# Patient Record
Sex: Female | Born: 1935
Health system: Southern US, Community
[De-identification: ages and names within clinical notes are randomized; demographics above are authoritative.]

## PROBLEM LIST (undated history)

## (undated) DIAGNOSIS — M1990 Unspecified osteoarthritis, unspecified site: Secondary | ICD-10-CM

## (undated) DIAGNOSIS — Z800 Family history of malignant neoplasm of digestive organs: Secondary | ICD-10-CM

## (undated) DIAGNOSIS — I10 Essential (primary) hypertension: Secondary | ICD-10-CM

## (undated) DIAGNOSIS — Z8041 Family history of malignant neoplasm of ovary: Secondary | ICD-10-CM

## (undated) DIAGNOSIS — Z803 Family history of malignant neoplasm of breast: Secondary | ICD-10-CM

## (undated) DIAGNOSIS — T8859XA Other complications of anesthesia, initial encounter: Secondary | ICD-10-CM

## (undated) DIAGNOSIS — T4145XA Adverse effect of unspecified anesthetic, initial encounter: Secondary | ICD-10-CM

## (undated) HISTORY — PX: EYE SURGERY: SHX253

## (undated) HISTORY — PX: ABDOMINAL HYSTERECTOMY: SHX81

## (undated) HISTORY — DX: Family history of malignant neoplasm of breast: Z80.3

## (undated) HISTORY — PX: TUBAL LIGATION: SHX77

## (undated) HISTORY — DX: Family history of malignant neoplasm of digestive organs: Z80.0

## (undated) HISTORY — DX: Family history of malignant neoplasm of ovary: Z80.41

## (undated) HISTORY — PX: CHOLECYSTECTOMY: SHX55

---
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
## 1998-09-03 ENCOUNTER — Ambulatory Visit (HOSPITAL_COMMUNITY): Admission: RE | Admit: 1998-09-03 | Discharge: 1998-09-03 | Payer: Self-pay | Admitting: Obstetrics & Gynecology

## 1998-09-15 ENCOUNTER — Encounter: Payer: Self-pay | Admitting: Emergency Medicine

## 1998-09-15 ENCOUNTER — Emergency Department (HOSPITAL_COMMUNITY): Admission: EM | Admit: 1998-09-15 | Discharge: 1998-09-15 | Payer: Self-pay | Admitting: Emergency Medicine

## 1999-03-24 ENCOUNTER — Other Ambulatory Visit: Admission: RE | Admit: 1999-03-24 | Discharge: 1999-03-24 | Payer: Self-pay | Admitting: Obstetrics & Gynecology

## 1999-09-21 ENCOUNTER — Encounter: Payer: Self-pay | Admitting: Cardiology

## 1999-09-21 ENCOUNTER — Ambulatory Visit (HOSPITAL_COMMUNITY): Admission: RE | Admit: 1999-09-21 | Discharge: 1999-09-21 | Payer: Self-pay | Admitting: Cardiology

## 2000-09-28 ENCOUNTER — Ambulatory Visit (HOSPITAL_COMMUNITY): Admission: RE | Admit: 2000-09-28 | Discharge: 2000-09-28 | Payer: Self-pay | Admitting: Internal Medicine

## 2000-09-28 ENCOUNTER — Encounter: Payer: Self-pay | Admitting: Internal Medicine

## 2001-04-05 ENCOUNTER — Other Ambulatory Visit: Admission: RE | Admit: 2001-04-05 | Discharge: 2001-04-05 | Payer: Self-pay | Admitting: Internal Medicine

## 2001-09-29 ENCOUNTER — Ambulatory Visit (HOSPITAL_COMMUNITY): Admission: RE | Admit: 2001-09-29 | Discharge: 2001-09-29 | Payer: Self-pay | Admitting: Internal Medicine

## 2001-09-29 ENCOUNTER — Encounter: Payer: Self-pay | Admitting: Internal Medicine

## 2002-10-01 ENCOUNTER — Encounter: Payer: Self-pay | Admitting: Internal Medicine

## 2002-10-01 ENCOUNTER — Ambulatory Visit (HOSPITAL_COMMUNITY): Admission: RE | Admit: 2002-10-01 | Discharge: 2002-10-01 | Payer: Self-pay | Admitting: Internal Medicine

## 2003-11-01 ENCOUNTER — Ambulatory Visit (HOSPITAL_COMMUNITY): Admission: RE | Admit: 2003-11-01 | Discharge: 2003-11-01 | Payer: Self-pay | Admitting: Internal Medicine

## 2004-11-27 ENCOUNTER — Ambulatory Visit (HOSPITAL_COMMUNITY): Admission: RE | Admit: 2004-11-27 | Discharge: 2004-11-27 | Payer: Self-pay | Admitting: Internal Medicine

## 2005-12-16 ENCOUNTER — Ambulatory Visit (HOSPITAL_COMMUNITY): Admission: RE | Admit: 2005-12-16 | Discharge: 2005-12-16 | Payer: Self-pay | Admitting: Internal Medicine

## 2006-12-19 ENCOUNTER — Ambulatory Visit (HOSPITAL_COMMUNITY): Admission: RE | Admit: 2006-12-19 | Discharge: 2006-12-19 | Payer: Self-pay | Admitting: Internal Medicine

## 2007-07-27 ENCOUNTER — Encounter: Admission: RE | Admit: 2007-07-27 | Discharge: 2007-07-27 | Payer: Self-pay | Admitting: Otolaryngology

## 2007-12-22 ENCOUNTER — Ambulatory Visit (HOSPITAL_COMMUNITY): Admission: RE | Admit: 2007-12-22 | Discharge: 2007-12-22 | Payer: Self-pay | Admitting: Internal Medicine

## 2008-02-14 ENCOUNTER — Encounter: Payer: Self-pay | Admitting: Pulmonary Disease

## 2008-02-14 ENCOUNTER — Encounter: Admission: RE | Admit: 2008-02-14 | Discharge: 2008-02-14 | Payer: Self-pay | Admitting: Internal Medicine

## 2008-02-22 ENCOUNTER — Emergency Department (HOSPITAL_COMMUNITY): Admission: EM | Admit: 2008-02-22 | Discharge: 2008-02-22 | Payer: Self-pay | Admitting: Emergency Medicine

## 2008-06-28 ENCOUNTER — Encounter: Admission: RE | Admit: 2008-06-28 | Discharge: 2008-06-28 | Payer: Self-pay | Admitting: Internal Medicine

## 2008-10-21 ENCOUNTER — Encounter: Payer: Self-pay | Admitting: Pulmonary Disease

## 2008-10-21 ENCOUNTER — Encounter: Admission: RE | Admit: 2008-10-21 | Discharge: 2008-10-21 | Payer: Self-pay | Admitting: Internal Medicine

## 2008-12-25 ENCOUNTER — Encounter: Admission: RE | Admit: 2008-12-25 | Discharge: 2008-12-25 | Payer: Self-pay | Admitting: Internal Medicine

## 2008-12-31 ENCOUNTER — Encounter: Admission: RE | Admit: 2008-12-31 | Discharge: 2008-12-31 | Payer: Self-pay | Admitting: Internal Medicine

## 2009-02-13 ENCOUNTER — Ambulatory Visit: Payer: Self-pay | Admitting: Pulmonary Disease

## 2009-02-13 DIAGNOSIS — J984 Other disorders of lung: Secondary | ICD-10-CM | POA: Insufficient documentation

## 2009-02-13 DIAGNOSIS — E782 Mixed hyperlipidemia: Secondary | ICD-10-CM | POA: Insufficient documentation

## 2009-02-13 DIAGNOSIS — J90 Pleural effusion, not elsewhere classified: Secondary | ICD-10-CM | POA: Insufficient documentation

## 2009-02-13 DIAGNOSIS — E785 Hyperlipidemia, unspecified: Secondary | ICD-10-CM

## 2009-02-13 DIAGNOSIS — J309 Allergic rhinitis, unspecified: Secondary | ICD-10-CM | POA: Insufficient documentation

## 2009-03-31 ENCOUNTER — Ambulatory Visit: Payer: Self-pay | Admitting: Cardiovascular Disease

## 2009-06-23 ENCOUNTER — Encounter: Admission: RE | Admit: 2009-06-23 | Discharge: 2009-06-23 | Payer: Self-pay | Admitting: Internal Medicine

## 2009-12-30 ENCOUNTER — Encounter: Admission: RE | Admit: 2009-12-30 | Discharge: 2009-12-30 | Payer: Self-pay | Admitting: Internal Medicine

## 2010-01-08 ENCOUNTER — Encounter: Admission: RE | Admit: 2010-01-08 | Discharge: 2010-01-08 | Payer: Self-pay | Admitting: Internal Medicine

## 2010-01-13 ENCOUNTER — Encounter: Admission: RE | Admit: 2010-01-13 | Discharge: 2010-01-13 | Payer: Self-pay | Admitting: Internal Medicine

## 2010-12-13 ENCOUNTER — Encounter: Payer: Self-pay | Admitting: Internal Medicine

## 2010-12-14 ENCOUNTER — Other Ambulatory Visit: Payer: Self-pay | Admitting: Internal Medicine

## 2010-12-14 DIAGNOSIS — Z1239 Encounter for other screening for malignant neoplasm of breast: Secondary | ICD-10-CM

## 2010-12-31 ENCOUNTER — Ambulatory Visit
Admission: RE | Admit: 2010-12-31 | Discharge: 2010-12-31 | Disposition: A | Discharge status: Home / Self Care | Payer: Medicare Other | Source: Ambulatory Visit | Attending: Internal Medicine | Admitting: Internal Medicine

## 2010-12-31 DIAGNOSIS — Z1239 Encounter for other screening for malignant neoplasm of breast: Secondary | ICD-10-CM

## 2011-04-22 ENCOUNTER — Ambulatory Visit: Payer: Self-pay | Admitting: Ophthalmology

## 2011-05-05 ENCOUNTER — Ambulatory Visit: Payer: Self-pay | Admitting: Ophthalmology

## 2011-10-21 ENCOUNTER — Other Ambulatory Visit: Payer: Self-pay | Admitting: Gastroenterology

## 2011-11-30 ENCOUNTER — Other Ambulatory Visit: Payer: Self-pay | Admitting: Internal Medicine

## 2011-11-30 DIAGNOSIS — Z1231 Encounter for screening mammogram for malignant neoplasm of breast: Secondary | ICD-10-CM

## 2012-01-04 ENCOUNTER — Ambulatory Visit
Admission: RE | Admit: 2012-01-04 | Discharge: 2012-01-04 | Disposition: A | Discharge status: Home / Self Care | Payer: Medicare Other | Source: Ambulatory Visit | Attending: Internal Medicine | Admitting: Internal Medicine

## 2012-01-04 ENCOUNTER — Ambulatory Visit: Payer: Medicare Other

## 2012-01-04 DIAGNOSIS — Z1231 Encounter for screening mammogram for malignant neoplasm of breast: Secondary | ICD-10-CM | POA: Diagnosis not present

## 2012-01-10 DIAGNOSIS — K589 Irritable bowel syndrome without diarrhea: Secondary | ICD-10-CM | POA: Diagnosis not present

## 2012-01-10 DIAGNOSIS — Z86010 Personal history of colonic polyps: Secondary | ICD-10-CM | POA: Diagnosis not present

## 2012-05-23 ENCOUNTER — Other Ambulatory Visit: Payer: Self-pay | Admitting: Dermatology

## 2012-05-23 DIAGNOSIS — Z85828 Personal history of other malignant neoplasm of skin: Secondary | ICD-10-CM | POA: Diagnosis not present

## 2012-05-23 DIAGNOSIS — C44319 Basal cell carcinoma of skin of other parts of face: Secondary | ICD-10-CM | POA: Diagnosis not present

## 2012-05-29 DIAGNOSIS — H4010X0 Unspecified open-angle glaucoma, stage unspecified: Secondary | ICD-10-CM | POA: Diagnosis not present

## 2012-08-01 DIAGNOSIS — Z85828 Personal history of other malignant neoplasm of skin: Secondary | ICD-10-CM | POA: Diagnosis not present

## 2012-08-22 DIAGNOSIS — Z23 Encounter for immunization: Secondary | ICD-10-CM | POA: Diagnosis not present

## 2012-09-25 DIAGNOSIS — IMO0002 Reserved for concepts with insufficient information to code with codable children: Secondary | ICD-10-CM | POA: Diagnosis not present

## 2012-09-25 DIAGNOSIS — M1710 Unilateral primary osteoarthritis, unspecified knee: Secondary | ICD-10-CM | POA: Diagnosis not present

## 2012-12-04 DIAGNOSIS — H4010X0 Unspecified open-angle glaucoma, stage unspecified: Secondary | ICD-10-CM | POA: Diagnosis not present

## 2012-12-11 DIAGNOSIS — H26499 Other secondary cataract, unspecified eye: Secondary | ICD-10-CM | POA: Diagnosis not present

## 2012-12-13 ENCOUNTER — Other Ambulatory Visit: Payer: Self-pay | Admitting: Internal Medicine

## 2012-12-13 DIAGNOSIS — Z1231 Encounter for screening mammogram for malignant neoplasm of breast: Secondary | ICD-10-CM

## 2012-12-14 DIAGNOSIS — E785 Hyperlipidemia, unspecified: Secondary | ICD-10-CM | POA: Diagnosis not present

## 2012-12-14 DIAGNOSIS — M899 Disorder of bone, unspecified: Secondary | ICD-10-CM | POA: Diagnosis not present

## 2012-12-14 DIAGNOSIS — M949 Disorder of cartilage, unspecified: Secondary | ICD-10-CM | POA: Diagnosis not present

## 2012-12-14 DIAGNOSIS — N318 Other neuromuscular dysfunction of bladder: Secondary | ICD-10-CM | POA: Diagnosis not present

## 2012-12-21 DIAGNOSIS — Z0000 Encounter for general adult medical examination without abnormal findings: Secondary | ICD-10-CM | POA: Diagnosis not present

## 2012-12-21 DIAGNOSIS — N318 Other neuromuscular dysfunction of bladder: Secondary | ICD-10-CM | POA: Diagnosis not present

## 2012-12-21 DIAGNOSIS — Z124 Encounter for screening for malignant neoplasm of cervix: Secondary | ICD-10-CM | POA: Diagnosis not present

## 2012-12-21 DIAGNOSIS — D869 Sarcoidosis, unspecified: Secondary | ICD-10-CM | POA: Diagnosis not present

## 2012-12-21 DIAGNOSIS — R3129 Other microscopic hematuria: Secondary | ICD-10-CM | POA: Diagnosis not present

## 2012-12-21 DIAGNOSIS — F329 Major depressive disorder, single episode, unspecified: Secondary | ICD-10-CM | POA: Diagnosis not present

## 2012-12-21 DIAGNOSIS — R030 Elevated blood-pressure reading, without diagnosis of hypertension: Secondary | ICD-10-CM | POA: Diagnosis not present

## 2013-01-01 ENCOUNTER — Other Ambulatory Visit: Payer: Self-pay | Admitting: Dermatology

## 2013-01-01 DIAGNOSIS — L820 Inflamed seborrheic keratosis: Secondary | ICD-10-CM | POA: Diagnosis not present

## 2013-01-01 DIAGNOSIS — L821 Other seborrheic keratosis: Secondary | ICD-10-CM | POA: Diagnosis not present

## 2013-01-01 DIAGNOSIS — Z85828 Personal history of other malignant neoplasm of skin: Secondary | ICD-10-CM | POA: Diagnosis not present

## 2013-01-01 DIAGNOSIS — D485 Neoplasm of uncertain behavior of skin: Secondary | ICD-10-CM | POA: Diagnosis not present

## 2013-01-10 DIAGNOSIS — G4733 Obstructive sleep apnea (adult) (pediatric): Secondary | ICD-10-CM | POA: Diagnosis not present

## 2013-01-16 ENCOUNTER — Ambulatory Visit
Admission: RE | Admit: 2013-01-16 | Discharge: 2013-01-16 | Disposition: A | Discharge status: Home / Self Care | Payer: Medicare Other | Source: Ambulatory Visit | Attending: Internal Medicine | Admitting: Internal Medicine

## 2013-01-16 DIAGNOSIS — Z1231 Encounter for screening mammogram for malignant neoplasm of breast: Secondary | ICD-10-CM

## 2013-03-26 DIAGNOSIS — H4010X0 Unspecified open-angle glaucoma, stage unspecified: Secondary | ICD-10-CM | POA: Diagnosis not present

## 2013-04-13 DIAGNOSIS — H4010X0 Unspecified open-angle glaucoma, stage unspecified: Secondary | ICD-10-CM | POA: Diagnosis not present

## 2013-05-24 DIAGNOSIS — H4010X0 Unspecified open-angle glaucoma, stage unspecified: Secondary | ICD-10-CM | POA: Diagnosis not present

## 2013-07-26 DIAGNOSIS — H4010X0 Unspecified open-angle glaucoma, stage unspecified: Secondary | ICD-10-CM | POA: Diagnosis not present

## 2013-07-27 DIAGNOSIS — M1710 Unilateral primary osteoarthritis, unspecified knee: Secondary | ICD-10-CM | POA: Diagnosis not present

## 2013-09-03 DIAGNOSIS — H4010X0 Unspecified open-angle glaucoma, stage unspecified: Secondary | ICD-10-CM | POA: Diagnosis not present

## 2013-09-06 DIAGNOSIS — Z23 Encounter for immunization: Secondary | ICD-10-CM | POA: Diagnosis not present

## 2013-10-01 DIAGNOSIS — H4010X0 Unspecified open-angle glaucoma, stage unspecified: Secondary | ICD-10-CM | POA: Diagnosis not present

## 2013-10-23 DIAGNOSIS — M899 Disorder of bone, unspecified: Secondary | ICD-10-CM | POA: Diagnosis not present

## 2013-11-12 DIAGNOSIS — M1710 Unilateral primary osteoarthritis, unspecified knee: Secondary | ICD-10-CM | POA: Diagnosis not present

## 2013-12-03 DIAGNOSIS — IMO0002 Reserved for concepts with insufficient information to code with codable children: Secondary | ICD-10-CM | POA: Diagnosis not present

## 2013-12-03 DIAGNOSIS — M1710 Unilateral primary osteoarthritis, unspecified knee: Secondary | ICD-10-CM | POA: Diagnosis not present

## 2013-12-31 DIAGNOSIS — Z79899 Other long term (current) drug therapy: Secondary | ICD-10-CM | POA: Diagnosis not present

## 2013-12-31 DIAGNOSIS — M949 Disorder of cartilage, unspecified: Secondary | ICD-10-CM | POA: Diagnosis not present

## 2013-12-31 DIAGNOSIS — M899 Disorder of bone, unspecified: Secondary | ICD-10-CM | POA: Diagnosis not present

## 2013-12-31 DIAGNOSIS — E785 Hyperlipidemia, unspecified: Secondary | ICD-10-CM | POA: Diagnosis not present

## 2014-01-04 DIAGNOSIS — H4010X0 Unspecified open-angle glaucoma, stage unspecified: Secondary | ICD-10-CM | POA: Diagnosis not present

## 2014-01-07 DIAGNOSIS — H409 Unspecified glaucoma: Secondary | ICD-10-CM | POA: Diagnosis not present

## 2014-01-07 DIAGNOSIS — R030 Elevated blood-pressure reading, without diagnosis of hypertension: Secondary | ICD-10-CM | POA: Diagnosis not present

## 2014-01-07 DIAGNOSIS — Z23 Encounter for immunization: Secondary | ICD-10-CM | POA: Diagnosis not present

## 2014-01-07 DIAGNOSIS — M1710 Unilateral primary osteoarthritis, unspecified knee: Secondary | ICD-10-CM | POA: Diagnosis not present

## 2014-01-07 DIAGNOSIS — Z1331 Encounter for screening for depression: Secondary | ICD-10-CM | POA: Diagnosis not present

## 2014-01-07 DIAGNOSIS — D869 Sarcoidosis, unspecified: Secondary | ICD-10-CM | POA: Diagnosis not present

## 2014-01-07 DIAGNOSIS — Z79899 Other long term (current) drug therapy: Secondary | ICD-10-CM | POA: Diagnosis not present

## 2014-01-07 DIAGNOSIS — K589 Irritable bowel syndrome without diarrhea: Secondary | ICD-10-CM | POA: Diagnosis not present

## 2014-01-07 DIAGNOSIS — IMO0002 Reserved for concepts with insufficient information to code with codable children: Secondary | ICD-10-CM | POA: Diagnosis not present

## 2014-01-07 DIAGNOSIS — E669 Obesity, unspecified: Secondary | ICD-10-CM | POA: Diagnosis not present

## 2014-01-07 DIAGNOSIS — Z0000 Encounter for general adult medical examination without abnormal findings: Secondary | ICD-10-CM | POA: Diagnosis not present

## 2014-01-07 DIAGNOSIS — E785 Hyperlipidemia, unspecified: Secondary | ICD-10-CM | POA: Diagnosis not present

## 2014-01-07 DIAGNOSIS — Z124 Encounter for screening for malignant neoplasm of cervix: Secondary | ICD-10-CM | POA: Diagnosis not present

## 2014-01-08 DIAGNOSIS — Z1212 Encounter for screening for malignant neoplasm of rectum: Secondary | ICD-10-CM | POA: Diagnosis not present

## 2014-02-01 DIAGNOSIS — H4010X0 Unspecified open-angle glaucoma, stage unspecified: Secondary | ICD-10-CM | POA: Diagnosis not present

## 2014-02-15 ENCOUNTER — Encounter (HOSPITAL_COMMUNITY): Payer: Self-pay | Admitting: Pharmacy Technician

## 2014-02-20 NOTE — Patient Instructions (Addendum)
20     Your procedure is scheduled on:  Wednesday 02/27/2014  Report to [place] at  1130 AM.  Call this number if you have problems the night before or morning of surgery: [phone]   Remember:             IF YOU USE CPAP,BRING MASK AND TUBING AM OF SURGERY!             IF YOU DO NOT HAVE YOUR TYPE AND SCREEN DRAWN AT PRE-ADMIT APPOINTMENT, YOU WILL HAVE IT DRAWN AM OF SURGERY!   Do not eat food  AFTER MIDNIGHT! MAY HAVE CLEAR LIQUIDS FROM MIDNIGHT UP UNTIL 0800 AM THEN NOTHING UNTIL AFTER SURGERY!  Take these medicines the morning of surgery with A SIP OF WATER:  Use eye drops if needed    [place].  Marland Kitchen  Leave suitcase in the car. After surgery it may be brought to your room.  For patients admitted to the hospital, checkout time is 11:00 AM the day of              Discharge.    DO NOT WEAR JEWELRY,MAKE-UP,LOTIONS,POWDERS,PERFUMES,CONTACTS , DENTURES OR BRIDGEWORK ,AND DO NOT WEAR FALSE EYELASHES                                    Patients discharged the day of surgery will not be allowed to drive home.  If going home the same day of surgery, must have someone stay with you  first 24 hrs.at home and arrange for someone to drive you home from the [place]: N/A   Special Instructions:              Please read over the following fact sheets that you were given:             1. [place].Tobin Chad     [phone]                              [facility] - Preparing for Surgery Before surgery, you can play an important role.  Because skin is not sterile, your skin needs to be as free of germs as possible.  You can reduce the number of germs on your skin by washing with CHG (chlorahexidine gluconate) soap before surgery.  CHG is an  antiseptic cleaner which kills germs and bonds with the skin to continue killing germs even after washing. Please DO NOT use if you have an allergy to CHG or antibacterial soaps.  If your skin becomes reddened/irritated stop using the CHG and inform your nurse when you arrive at Short Stay. Do not shave (including legs and underarms) for at least 48 hours prior to the first CHG shower.  You may shave your face. Please follow these  instructions carefully:  1.  Shower with CHG Soap the night before surgery and the  morning of Surgery.  2.  If you choose to wash your hair, wash your hair first as usual with your  normal  shampoo.  3.  After you shampoo, rinse your hair and body thoroughly to remove the  shampoo.                           4.  Use CHG as you would any other liquid soap.  You can apply chg directly  to the skin and wash                       Gently with a scrungie or clean washcloth.  5.  Apply the CHG Soap to your body ONLY FROM THE NECK DOWN.   Do not use on open                           Wound or open sores. Avoid contact with eyes, ears mouth and genitals (private parts).                        Genitals (private parts) with your normal soap.             6.  Wash thoroughly, paying special attention to the area where your surgery  will be performed.  7.  Thoroughly rinse your body with warm water from the neck down.  8.  DO NOT shower/wash with your normal soap after using and rinsing off  the CHG Soap.                9.  Pat yourself dry with a clean towel.            10.  Wear clean pajamas.            11.  Place clean sheets on your bed the night of your first shower and do not  sleep with pets. Day of Surgery : Do not apply any lotions/deodorants the morning of surgery.  Please wear clean clothes to the hospital/surgery center.  FAILURE TO FOLLOW THESE INSTRUCTIONS MAY RESULT IN THE CANCELLATION OF YOUR SURGERY PATIENT SIGNATURE_________________________________  NURSE  SIGNATURE__________________________________  Incentive Spirometer  An incentive spirometer is a tool that can help keep your lungs clear and active. This tool measures how well you are filling your lungs with each breath. Taking long deep breaths may help reverse or decrease the chance of developing breathing (pulmonary) problems (especially infection) following:  A long period of time when you are unable to move or be active. BEFORE THE PROCEDURE   If the spirometer includes an indicator to show your best effort, your nurse or respiratory therapist will set it to a desired goal.  If possible, sit up straight or lean slightly forward. Try not to slouch.  Hold the incentive spirometer in an upright position. INSTRUCTIONS FOR USE  1. Sit on the edge of your bed if possible, or sit up as far as you can in bed or on a chair. 2. Hold the incentive spirometer in an upright position. 3. Breathe out normally. 4. Place the mouthpiece in your mouth and seal your lips tightly around it. 5. Breathe in slowly and as deeply as possible, raising the piston or the ball toward the top of the column. 6. Hold your  breath for 3-5 seconds or for as long as possible. Allow the piston or ball to fall to the bottom of the column. 7. Remove the mouthpiece from your mouth and breathe out normally. 8. Rest for a few seconds and repeat Steps 1 through 7 at least 10 times every 1-2 hours when you are awake. Take your time and take a few normal breaths between deep breaths. 9. The spirometer may include an indicator to show your best effort. Use the indicator as a goal to work toward during each repetition. 10. After each set of 10 deep breaths, practice coughing to be sure your lungs are clear. If you have an incision (the cut made at the time of surgery), support your incision when coughing by placing a pillow or rolled up towels firmly against it. Once you are able to get out of bed, walk around indoors and cough  well. You may stop using the incentive spirometer when instructed by your caregiver.  RISKS AND COMPLICATIONS  Take your time so you do not get dizzy or light-headed.  If you are in pain, you may need to take or ask for pain medication before doing incentive spirometry. It is harder to take a deep breath if you are having pain. AFTER USE  Rest and breathe slowly and easily.  It can be helpful to keep track of a log of your progress. Your caregiver can provide you with a simple table to help with this. If you are using the spirometer at home, follow these instructions: [place] IF:   You are having difficultly using the spirometer.  You have trouble using the spirometer as often as instructed.  Your pain medication is not giving enough relief while using the spirometer.  You develop fever of 100.5 F (38.1 C) or higher. SEEK IMMEDIATE MEDICAL CARE IF:   You cough up bloody sputum that had not been present before.  You develop fever of 102 F (38.9 C) or greater.  You develop worsening pain at or near the incision site. MAKE SURE YOU:   Understand these instructions.  Will watch your condition.  Will get help right away if you are not doing well or get worse. Document Released: 03/21/2007 Document Revised: 01/31/2012 Document Reviewed: 05/22/2007 [facility] Patient Information 2014 [place], [place].   WHAT IS A BLOOD TRANSFUSION? Blood Transfusion Information  A transfusion is the replacement of blood or some of its parts. Blood is made up of multiple cells which provide different functions.  Red blood cells carry oxygen and are used for blood loss replacement.  White blood cells fight against infection.  Platelets control bleeding.  Plasma helps clot blood.  Other blood products are available for specialized needs, such as hemophilia or other clotting disorders. BEFORE THE TRANSFUSION  Who gives blood for transfusions?   Healthy volunteers who are fully  evaluated to make sure their blood is safe. This is blood bank blood. Transfusion therapy is the safest it has ever been in the practice of medicine. Before blood is taken from a donor, a complete history is taken to make sure that person has no history of diseases nor engages in risky social behavior (examples are intravenous drug use or sexual activity with multiple partners). The donor's travel history is screened to minimize risk of transmitting infections, such as malaria. The donated blood is tested for signs of infectious diseases, such as HIV and hepatitis. The blood is then tested to be sure it is compatible with you in  order to minimize the chance of a transfusion reaction. If you or a relative donates blood, this is often done in anticipation of surgery and is not appropriate for emergency situations. It takes many days to process the donated blood. RISKS AND COMPLICATIONS Although transfusion therapy is very safe and saves many lives, the main dangers of transfusion include:   Getting an infectious disease.  Developing a transfusion reaction. This is an allergic reaction to something in the blood you were given. Every precaution is taken to prevent this. The decision to have a blood transfusion has been considered carefully by your caregiver before blood is given. Blood is not given unless the benefits outweigh the risks. AFTER THE TRANSFUSION  Right after receiving a blood transfusion, you will usually feel much better and more energetic. This is especially true if your red blood cells have gotten low (anemic). The transfusion raises the level of the red blood cells which carry oxygen, and this usually causes an energy increase.  The nurse administering the transfusion will monitor you carefully for complications. HOME CARE INSTRUCTIONS  No special instructions are needed after a transfusion. You may find your energy is better. Speak with your caregiver about any limitations on activity  for underlying diseases you may have. SEEK MEDICAL CARE IF:   Your condition is not improving after your transfusion.  You develop redness or irritation at the intravenous (IV) site. SEEK IMMEDIATE MEDICAL CARE IF:  Any of the following symptoms occur over the next 12 hours:  Shaking chills.  You have a temperature by mouth above 102 F (38.9 C), not controlled by medicine.  Chest, back, or muscle pain.  People around you feel you are not acting correctly or are confused.  Shortness of breath or difficulty breathing.  Dizziness and fainting.  You get a rash or develop hives.  You have a decrease in urine output.  Your urine turns a dark color or changes to pink, red, or brown. Any of the following symptoms occur over the next 10 days:  You have a temperature by mouth above 102 F (38.9 C), not controlled by medicine.  Shortness of breath.  Weakness after normal activity.  The white part of the eye turns yellow (jaundice).  You have a decrease in the amount of urine or are urinating less often.  Your urine turns a dark color or changes to pink, red, or brown. Document Released: 11/05/2000 Document Revised: 01/31/2012 Document Reviewed: 06/24/2008 [facility] Patient Information 2014 [place].

## 2014-02-21 ENCOUNTER — Encounter (HOSPITAL_COMMUNITY): Payer: Self-pay

## 2014-02-21 ENCOUNTER — Encounter (HOSPITAL_COMMUNITY)
Admission: RE | Admit: 2014-02-21 | Discharge: 2014-02-21 | Disposition: A | Discharge status: Home / Self Care | Payer: Medicare Other | Source: Ambulatory Visit | Attending: Orthopedic Surgery | Admitting: Orthopedic Surgery

## 2014-02-21 DIAGNOSIS — Z01812 Encounter for preprocedural laboratory examination: Secondary | ICD-10-CM | POA: Diagnosis not present

## 2014-02-21 HISTORY — DX: Other complications of anesthesia, initial encounter: T88.59XA

## 2014-02-21 HISTORY — DX: Adverse effect of unspecified anesthetic, initial encounter: T41.45XA

## 2014-02-21 HISTORY — DX: Unspecified osteoarthritis, unspecified site: M19.90

## 2014-02-21 LAB — CBC
HCT: 40.1 % (ref 36.0–46.0)
Hemoglobin: 13.3 g/dL (ref 12.0–15.0)
MCH: 29.3 pg (ref 26.0–34.0)
MCHC: 33.2 g/dL (ref 30.0–36.0)
MCV: 88.3 fL (ref 78.0–100.0)
PLATELETS: 282 10*3/uL (ref 150–400)
RBC: 4.54 MIL/uL (ref 3.87–5.11)
RDW: 13 % (ref 11.5–15.5)
WBC: 8.3 10*3/uL (ref 4.0–10.5)

## 2014-02-21 LAB — SURGICAL PCR SCREEN
MRSA, PCR: NEGATIVE
Staphylococcus aureus: NEGATIVE

## 2014-02-21 LAB — PROTIME-INR
INR: 1.07 (ref 0.00–1.49)
Prothrombin Time: 13.7 seconds (ref 11.6–15.2)

## 2014-02-21 LAB — COMPREHENSIVE METABOLIC PANEL
ALT: 20 U/L (ref 0–35)
AST: 26 U/L (ref 0–37)
Albumin: 3.7 g/dL (ref 3.5–5.2)
Alkaline Phosphatase: 67 U/L (ref 39–117)
BUN: 14 mg/dL (ref 6–23)
CO2: 26 mEq/L (ref 19–32)
Calcium: 9.6 mg/dL (ref 8.4–10.5)
Chloride: 103 mEq/L (ref 96–112)
Creatinine, Ser: 0.79 mg/dL (ref 0.50–1.10)
GFR calc Af Amer: 89 mL/min — ABNORMAL LOW (ref >90)
GFR calc non Af Amer: 77 mL/min — ABNORMAL LOW (ref >90)
Glucose, Bld: 93 mg/dL (ref 70–99)
Potassium: 4.7 mEq/L (ref 3.7–5.3)
Sodium: 141 mEq/L (ref 137–147)
Total Bilirubin: 0.3 mg/dL (ref 0.3–1.2)
Total Protein: 7.5 g/dL (ref 6.0–8.3)

## 2014-02-21 LAB — APTT: aPTT: 32 seconds (ref 24–37)

## 2014-02-21 LAB — URINE MICROSCOPIC-ADD ON

## 2014-02-21 LAB — URINALYSIS, ROUTINE W REFLEX MICROSCOPIC
Bilirubin Urine: NEGATIVE
Glucose, UA: NEGATIVE mg/dL
Ketones, ur: NEGATIVE mg/dL
Leukocytes, UA: NEGATIVE
Nitrite: NEGATIVE
Protein, ur: NEGATIVE mg/dL
Specific Gravity, Urine: 1.015 (ref 1.005–1.030)
Urobilinogen, UA: 0.2 mg/dL (ref 0.0–1.0)
pH: 6 (ref 5.0–8.0)

## 2014-02-21 LAB — ABO/RH: ABO/RH(D): O NEG

## 2014-02-26 NOTE — H&P (Signed)
TOTAL KNEE ADMISSION H&P  Patient is being admitted for right total knee arthroplasty.  Subjective:  Chief Complaint:right knee pain.  HPI: Jaime [name], 80 y.o. female, has a history of pain and functional disability in the right knee due to arthritis and has failed non-surgical conservative treatments for greater than 12 weeks to includeNSAID's and/or analgesics, corticosteriod injections, use of assistive devices and activity modification.  Onset of symptoms was gradual, starting >10 years ago with gradually worsening course since that time. The patient noted prior procedures on the knee to include  arthroscopy and menisectomy on the right knee(s).  Patient currently rates pain in the right knee(s) at 7 out of 10 with activity. Patient has night pain, worsening of pain with activity and weight bearing, pain that interferes with activities of daily living, pain with passive range of motion, crepitus and joint swelling.  Patient has evidence of periarticular osteophytes and joint space narrowing by imaging studies.There is no active infection.  Patient Active Problem List   Diagnosis Date Noted  . HYPERLIPIDEMIA 02/13/2009  . ALLERGIC RHINITIS 02/13/2009  . PLEURAL EFFUSION, RIGHT 02/13/2009  . PULMONARY NODULE 02/13/2009   Past Medical History  Diagnosis Date  . Complication of anesthesia     problem 40 yrs ago Doctor told her anesthesia drugs caused a reaction  . Arthritis     Past Surgical History  Procedure Laterality Date  . Abdominal hysterectomy    . Tubal ligation    . Cholecystectomy    . Eye surgery      cataract with lens implant-right     Current outpatient prescriptions: aspirin 325 MG tablet, Take 325 mg by mouth daily., Disp: , Rfl: ;   bimatoprost (LUMIGAN) 0.01 % SOLN, Place 1 drop into both eyes at bedtime., Disp: , Rfl: ;   Calcium Carb-Cholecalciferol (CALCIUM 600 + D PO), Take 600 tablets by mouth daily., Disp: , Rfl: ;  cholecalciferol (VITAMIN D) 400 UNITS  TABS tablet, Take 400 Units by mouth daily., Disp: , Rfl:  dorzolamide-timolol (COSOPT) 22.3-6.8 MG/ML ophthalmic solution, Place 1 drop into both eyes 2 (two) times daily., Disp: , Rfl: ;   fexofenadine (ALLEGRA) 180 MG tablet, Take 180 mg by mouth daily., Disp: , Rfl: ;   fluticasone (FLONASE) 50 MCG/ACT nasal spray, Place 1 spray into both nostrils daily as needed for allergies or rhinitis., Disp: , Rfl:  hyoscyamine (ANASPAZ) 0.125 MG TBDP disintergrating tablet, Place 0.125 mg under the tongue every 4 (four) hours as needed for cramping., Disp: , Rfl: ;   Multiple Vitamin (MULTIVITAMIN WITH MINERALS) TABS tablet, Take 1 tablet by mouth daily., Disp: , Rfl: ;   naproxen sodium (ALEVE) 220 MG tablet, Take 220 mg by mouth 2 (two) times daily as needed (Pain)., Disp: , Rfl: ;   raloxifene (EVISTA) 60 MG tablet, Take 60 mg by mouth daily., Disp: , Rfl:  rosuvastatin (CRESTOR) 10 MG tablet, Take 5 mg by mouth daily., Disp: , Rfl:   Allergies  Allergen Reactions  . Codeine     "makes me feel weird"  . Meperidine Hcl     Lowers blood pressure   . Sulfonamide Derivatives Rash    History  Substance Use Topics  . Smoking status: Never Smoker   . Smokeless tobacco: Not on file  . Alcohol Use: No      Review of Systems  Constitutional: Negative.   HENT: Negative.   Eyes: Negative.   Respiratory: Negative.   Gastrointestinal: Positive for diarrhea. Negative  for heartburn, nausea, vomiting, abdominal pain, constipation, blood in stool and melena.  Genitourinary: Positive for frequency. Negative for dysuria, urgency, hematuria and flank pain.  Musculoskeletal: Positive for joint pain. Negative for back pain, falls, myalgias and neck pain.  Skin: Negative.   Neurological: Negative.   Endo/Heme/Allergies: Negative.   Psychiatric/Behavioral: Negative.     Objective:  Physical Exam  Constitutional: She is oriented to person, place, and time. She appears well-developed and well-nourished.  No distress.  HENT:  Head: Normocephalic and atraumatic.  Right Ear: External ear normal.  Left Ear: External ear normal.  Nose: Nose normal.  Mouth/Throat: Oropharynx is clear and moist.  Eyes: Conjunctivae and EOM are normal.  Neck: Normal range of motion. Neck supple.  Cardiovascular: Normal rate, regular rhythm, normal heart sounds and intact distal pulses.   Respiratory: Effort normal and breath sounds normal. No respiratory distress. She has no wheezes.  GI: Soft. Bowel sounds are normal. She exhibits no distension. There is no tenderness.  Musculoskeletal:       Right hip: Normal.       Left hip: Normal.       Right knee: She exhibits decreased range of motion, swelling and abnormal alignment. She exhibits no effusion and no erythema. Tenderness found. Medial joint line and lateral joint line tenderness noted.       Left knee: She exhibits decreased range of motion. She exhibits no swelling and no erythema. Tenderness found. Medial joint line tenderness noted.       Right lower leg: She exhibits no tenderness and no swelling.       Left lower leg: She exhibits no tenderness and no swelling.  Pain with motion of the right knee. ROM 5-95 degrees  Neurological: She is alert and oriented to person, place, and time. She has normal strength and normal reflexes. No sensory deficit.  Skin: No rash noted. She is not diaphoretic. No erythema.  Psychiatric: She has a normal mood and affect. Her behavior is normal.    Vitals Weight: 204 lb Height: 65 in Body Surface Area: 2.06 m Body Mass Index: 33.95 kg/m Pulse: 76 (Regular) BP: 145/88 (Sitting, Left Arm, Standard)  Imaging Review Plain radiographs demonstrate severe degenerative joint disease of the right knee(s). The overall alignment issignificant valgus. The bone quality appears to be fair for age and reported activity level.  Assessment/Plan:  End stage arthritis, right knee   The patient history, physical  examination, clinical judgment of the provider and imaging studies are consistent with end stage degenerative joint disease of the right knee(s) and total knee arthroplasty is deemed medically necessary. The treatment options including medical management, injection therapy arthroscopy and arthroplasty were discussed at length. The risks and benefits of total knee arthroplasty were presented and reviewed. The risks due to aseptic loosening, infection, stiffness, patella tracking problems, thromboembolic complications and other imponderables were discussed. The patient acknowledged the explanation, agreed to proceed with the plan and consent was signed. Patient is being admitted for inpatient treatment for surgery, pain control, PT, OT, prophylactic antibiotics, VTE prophylaxis, progressive ambulation and ADL's and discharge planning. The patient is planning to be discharged to skilled nursing facility    [place], [place]

## 2014-02-27 ENCOUNTER — Inpatient Hospital Stay (HOSPITAL_COMMUNITY): Payer: Medicare Other | Admitting: Certified Registered Nurse Anesthetist

## 2014-02-27 ENCOUNTER — Encounter (HOSPITAL_COMMUNITY): Payer: Medicare Other | Admitting: Certified Registered Nurse Anesthetist

## 2014-02-27 ENCOUNTER — Inpatient Hospital Stay (HOSPITAL_COMMUNITY)
Admission: RE | Admit: 2014-02-27 | Discharge: 2014-03-03 | DRG: 470 | Disposition: A | Discharge status: Home Health | Payer: Medicare Other | Source: Ambulatory Visit | Attending: Orthopedic Surgery | Admitting: Orthopedic Surgery

## 2014-02-27 ENCOUNTER — Inpatient Hospital Stay (HOSPITAL_COMMUNITY): Payer: Medicare Other

## 2014-02-27 ENCOUNTER — Encounter (HOSPITAL_COMMUNITY): Payer: Self-pay

## 2014-02-27 ENCOUNTER — Encounter (HOSPITAL_COMMUNITY)
Admission: RE | Disposition: A | Discharge status: Home Health | Payer: Self-pay | Source: Ambulatory Visit | Attending: Orthopedic Surgery

## 2014-02-27 DIAGNOSIS — M24569 Contracture, unspecified knee: Secondary | ICD-10-CM | POA: Diagnosis present

## 2014-02-27 DIAGNOSIS — Z96659 Presence of unspecified artificial knee joint: Secondary | ICD-10-CM

## 2014-02-27 DIAGNOSIS — M1711 Unilateral primary osteoarthritis, right knee: Secondary | ICD-10-CM

## 2014-02-27 DIAGNOSIS — Z7982 Long term (current) use of aspirin: Secondary | ICD-10-CM | POA: Diagnosis not present

## 2014-02-27 DIAGNOSIS — M1710 Unilateral primary osteoarthritis, unspecified knee: Principal | ICD-10-CM | POA: Diagnosis present

## 2014-02-27 DIAGNOSIS — M21169 Varus deformity, not elsewhere classified, unspecified knee: Secondary | ICD-10-CM | POA: Diagnosis present

## 2014-02-27 DIAGNOSIS — Z79899 Other long term (current) drug therapy: Secondary | ICD-10-CM

## 2014-02-27 DIAGNOSIS — D62 Acute posthemorrhagic anemia: Secondary | ICD-10-CM | POA: Diagnosis not present

## 2014-02-27 DIAGNOSIS — IMO0002 Reserved for concepts with insufficient information to code with codable children: Secondary | ICD-10-CM | POA: Diagnosis not present

## 2014-02-27 DIAGNOSIS — E785 Hyperlipidemia, unspecified: Secondary | ICD-10-CM | POA: Diagnosis not present

## 2014-02-27 DIAGNOSIS — Z471 Aftercare following joint replacement surgery: Secondary | ICD-10-CM | POA: Diagnosis not present

## 2014-02-27 HISTORY — PX: TOTAL KNEE ARTHROPLASTY: SHX125

## 2014-02-27 LAB — TYPE AND SCREEN
ABO/RH(D): O NEG
Antibody Screen: NEGATIVE

## 2014-02-27 SURGERY — ARTHROPLASTY, KNEE, TOTAL
Anesthesia: General | Site: Knee | Laterality: Right

## 2014-02-27 MED ORDER — BUPIVACAINE LIPOSOME 1.3 % IJ SUSP
20.0000 mL | Freq: Once | INTRAMUSCULAR | Status: DC
  Filled 2014-02-27 (×2): qty 20

## 2014-02-27 MED ORDER — MENTHOL 3 MG MT LOZG: 1.0000 | LOZENGE | OROMUCOSAL | Status: DC | PRN

## 2014-02-27 MED ORDER — MIDAZOLAM HCL 5 MG/5ML IJ SOLN
INTRAMUSCULAR | Status: DC | PRN
  Administered 2014-02-27 (×2): 1 mg via INTRAVENOUS

## 2014-02-27 MED ORDER — POLYETHYLENE GLYCOL 3350 17 G PO PACK
17.0000 g | PACK | Freq: Every day | ORAL | Status: DC | PRN
  Administered 2014-03-01 – 2014-03-02 (×2): 17 g via ORAL

## 2014-02-27 MED ORDER — METHOCARBAMOL 100 MG/ML IJ SOLN
500.0000 mg | Freq: Four times a day (QID) | INTRAVENOUS | Status: DC | PRN
  Administered 2014-02-27: 500 mg via INTRAVENOUS
  Filled 2014-02-27: qty 5

## 2014-02-27 MED ORDER — LATANOPROST 0.005 % OP SOLN
1.0000 [drp] | Freq: Every day | OPHTHALMIC | Status: DC
  Administered 2014-02-27 – 2014-03-02 (×4): 1 [drp] via OPHTHALMIC
  Filled 2014-02-27: qty 2.5

## 2014-02-27 MED ORDER — DEXAMETHASONE SODIUM PHOSPHATE 10 MG/ML IJ SOLN
INTRAMUSCULAR | Status: DC | PRN
  Administered 2014-02-27: 10 mg via INTRAVENOUS

## 2014-02-27 MED ORDER — HYDROMORPHONE HCL PF 1 MG/ML IJ SOLN
1.0000 mg | INTRAMUSCULAR | Status: DC | PRN
  Administered 2014-02-27 – 2014-03-01 (×4): 1 mg via INTRAVENOUS
  Filled 2014-02-27 (×6): qty 1

## 2014-02-27 MED ORDER — LACTATED RINGERS IV SOLN
INTRAVENOUS | Status: DC
  Administered 2014-02-27 (×2): via INTRAVENOUS

## 2014-02-27 MED ORDER — PROPOFOL 10 MG/ML IV BOLUS
INTRAVENOUS | Status: AC
  Filled 2014-02-27: qty 20

## 2014-02-27 MED ORDER — HYDROMORPHONE HCL PF 1 MG/ML IJ SOLN
INTRAMUSCULAR | Status: AC
  Filled 2014-02-27: qty 1

## 2014-02-27 MED ORDER — HYDROMORPHONE HCL PF 1 MG/ML IJ SOLN
INTRAMUSCULAR | Status: DC | PRN
  Administered 2014-02-27: 0.5 mg via INTRAVENOUS
  Administered 2014-02-27: 1 mg via INTRAVENOUS
  Administered 2014-02-27: 0.5 mg via INTRAVENOUS

## 2014-02-27 MED ORDER — CEFAZOLIN SODIUM-DEXTROSE 2-3 GM-% IV SOLR
2.0000 g | INTRAVENOUS | Status: AC
  Administered 2014-02-27: 2 g via INTRAVENOUS

## 2014-02-27 MED ORDER — LACTATED RINGERS IV SOLN
INTRAVENOUS | Status: DC
  Administered 2014-02-27 – 2014-02-28 (×2): via INTRAVENOUS

## 2014-02-27 MED ORDER — SODIUM CHLORIDE 0.9 % IJ SOLN
INTRAMUSCULAR | Status: AC
  Filled 2014-02-27: qty 20

## 2014-02-27 MED ORDER — NEOSTIGMINE METHYLSULFATE 1 MG/ML IJ SOLN
INTRAMUSCULAR | Status: DC | PRN
  Administered 2014-02-27: 3 mg via INTRAVENOUS

## 2014-02-27 MED ORDER — ATORVASTATIN CALCIUM 10 MG PO TABS
10.0000 mg | ORAL_TABLET | Freq: Every day | ORAL | Status: DC
  Filled 2014-02-27 (×3): qty 1

## 2014-02-27 MED ORDER — CEFAZOLIN SODIUM-DEXTROSE 2-3 GM-% IV SOLR
INTRAVENOUS | Status: AC
  Filled 2014-02-27: qty 50

## 2014-02-27 MED ORDER — OXYCODONE-ACETAMINOPHEN 5-325 MG PO TABS
2.0000 | ORAL_TABLET | ORAL | Status: DC | PRN
  Administered 2014-02-27 – 2014-03-01 (×9): 2 via ORAL
  Administered 2014-03-02 (×2): 1 via ORAL
  Administered 2014-03-03: 2 via ORAL
  Filled 2014-02-27 (×12): qty 2

## 2014-02-27 MED ORDER — GLYCOPYRROLATE 0.2 MG/ML IJ SOLN
INTRAMUSCULAR | Status: AC
  Filled 2014-02-27: qty 3

## 2014-02-27 MED ORDER — FENTANYL CITRATE 0.05 MG/ML IJ SOLN
INTRAMUSCULAR | Status: AC
  Filled 2014-02-27: qty 2

## 2014-02-27 MED ORDER — ROCURONIUM BROMIDE 100 MG/10ML IV SOLN
INTRAVENOUS | Status: DC | PRN
  Administered 2014-02-27: 30 mg via INTRAVENOUS

## 2014-02-27 MED ORDER — HYOSCYAMINE SULFATE 0.125 MG PO TBDP
0.1250 mg | ORAL_TABLET | ORAL | Status: DC | PRN
  Filled 2014-02-27: qty 1

## 2014-02-27 MED ORDER — CEFAZOLIN SODIUM 1-5 GM-% IV SOLN
1.0000 g | Freq: Four times a day (QID) | INTRAVENOUS | Status: AC
  Administered 2014-02-27 – 2014-02-28 (×2): 1 g via INTRAVENOUS
  Filled 2014-02-27 (×2): qty 50

## 2014-02-27 MED ORDER — METHOCARBAMOL 500 MG PO TABS
500.0000 mg | ORAL_TABLET | Freq: Four times a day (QID) | ORAL | Status: DC | PRN
  Administered 2014-02-28 – 2014-03-03 (×7): 500 mg via ORAL
  Filled 2014-02-27 (×8): qty 1

## 2014-02-27 MED ORDER — FERROUS SULFATE 325 (65 FE) MG PO TABS
325.0000 mg | ORAL_TABLET | Freq: Three times a day (TID) | ORAL | Status: DC
  Administered 2014-02-28 – 2014-03-03 (×8): 325 mg via ORAL
  Filled 2014-02-27 (×14): qty 1

## 2014-02-27 MED ORDER — HYDROMORPHONE HCL PF 1 MG/ML IJ SOLN
0.2500 mg | INTRAMUSCULAR | Status: DC | PRN
  Administered 2014-02-27: 0.4 mg via INTRAVENOUS
  Administered 2014-02-27 (×3): 0.5 mg via INTRAVENOUS

## 2014-02-27 MED ORDER — BISACODYL 10 MG RE SUPP: 10.0000 mg | Freq: Every day | RECTAL | Status: DC | PRN

## 2014-02-27 MED ORDER — CHLORHEXIDINE GLUCONATE 4 % EX LIQD: 60.0000 mL | Freq: Once | CUTANEOUS | Status: DC

## 2014-02-27 MED ORDER — PROMETHAZINE HCL 25 MG/ML IJ SOLN: 6.2500 mg | INTRAMUSCULAR | Status: DC | PRN

## 2014-02-27 MED ORDER — FENTANYL CITRATE 0.05 MG/ML IJ SOLN
INTRAMUSCULAR | Status: DC | PRN
  Administered 2014-02-27: 100 ug via INTRAVENOUS

## 2014-02-27 MED ORDER — BUPIVACAINE LIPOSOME 1.3 % IJ SUSP
INTRAMUSCULAR | Status: DC | PRN
  Administered 2014-02-27: 20 mL

## 2014-02-27 MED ORDER — HYDROMORPHONE HCL PF 2 MG/ML IJ SOLN
INTRAMUSCULAR | Status: AC
  Filled 2014-02-27: qty 1

## 2014-02-27 MED ORDER — HYDROMORPHONE HCL PF 1 MG/ML IJ SOLN
INTRAMUSCULAR | Status: AC
  Administered 2014-02-28: 1 mg
  Filled 2014-02-27: qty 1

## 2014-02-27 MED ORDER — PROPOFOL 10 MG/ML IV BOLUS
INTRAVENOUS | Status: DC | PRN
  Administered 2014-02-27: 150 mg via INTRAVENOUS

## 2014-02-27 MED ORDER — SODIUM CHLORIDE 0.9 % IR SOLN
Status: DC | PRN
  Administered 2014-02-27: 14:00:00

## 2014-02-27 MED ORDER — RIVAROXABAN 10 MG PO TABS
10.0000 mg | ORAL_TABLET | Freq: Every day | ORAL | Status: DC
  Administered 2014-02-28 – 2014-03-03 (×4): 10 mg via ORAL
  Filled 2014-02-27 (×5): qty 1

## 2014-02-27 MED ORDER — ALUM & MAG HYDROXIDE-SIMETH 200-200-20 MG/5ML PO SUSP: 30.0000 mL | ORAL | Status: DC | PRN

## 2014-02-27 MED ORDER — FLEET ENEMA 7-19 GM/118ML RE ENEM: 1.0000 | ENEMA | Freq: Once | RECTAL | Status: AC | PRN

## 2014-02-27 MED ORDER — ACETAMINOPHEN 325 MG PO TABS: 650.0000 mg | ORAL_TABLET | Freq: Four times a day (QID) | ORAL | Status: DC | PRN

## 2014-02-27 MED ORDER — LIP MEDEX EX OINT
TOPICAL_OINTMENT | CUTANEOUS | Status: AC
  Administered 2014-02-27: 19:00:00
  Filled 2014-02-27: qty 7

## 2014-02-27 MED ORDER — ONDANSETRON HCL 4 MG PO TABS
4.0000 mg | ORAL_TABLET | Freq: Four times a day (QID) | ORAL | Status: DC | PRN
  Administered 2014-03-01: 4 mg via ORAL
  Filled 2014-02-27: qty 1

## 2014-02-27 MED ORDER — LACTATED RINGERS IV SOLN
INTRAVENOUS | Status: DC
  Administered 2014-02-27: 1000 mL via INTRAVENOUS

## 2014-02-27 MED ORDER — ONDANSETRON HCL 4 MG/2ML IJ SOLN
INTRAMUSCULAR | Status: AC
  Filled 2014-02-27: qty 2

## 2014-02-27 MED ORDER — THROMBIN 5000 UNITS EX SOLR
CUTANEOUS | Status: AC
  Filled 2014-02-27: qty 5000

## 2014-02-27 MED ORDER — FLUTICASONE PROPIONATE 50 MCG/ACT NA SUSP
1.0000 | Freq: Every day | NASAL | Status: DC | PRN
  Administered 2014-02-27: 1 via NASAL
  Filled 2014-02-27: qty 16

## 2014-02-27 MED ORDER — GLYCOPYRROLATE 0.2 MG/ML IJ SOLN
INTRAMUSCULAR | Status: DC | PRN
  Administered 2014-02-27: .5 mg via INTRAVENOUS

## 2014-02-27 MED ORDER — LORATADINE 10 MG PO TABS
10.0000 mg | ORAL_TABLET | Freq: Every day | ORAL | Status: DC
  Administered 2014-03-01: 10 mg via ORAL
  Filled 2014-02-27 (×4): qty 1

## 2014-02-27 MED ORDER — ACETAMINOPHEN 10 MG/ML IV SOLN
1000.0000 mg | Freq: Four times a day (QID) | INTRAVENOUS | Status: DC
  Administered 2014-02-27: 1000 mg via INTRAVENOUS
  Filled 2014-02-27: qty 100

## 2014-02-27 MED ORDER — ONDANSETRON HCL 4 MG/2ML IJ SOLN
INTRAMUSCULAR | Status: DC | PRN
  Administered 2014-02-27: 4 mg via INTRAVENOUS

## 2014-02-27 MED ORDER — MIDAZOLAM HCL 2 MG/2ML IJ SOLN
INTRAMUSCULAR | Status: AC
  Filled 2014-02-27: qty 2

## 2014-02-27 MED ORDER — SODIUM CHLORIDE 0.9 % IJ SOLN
INTRAMUSCULAR | Status: DC | PRN
  Administered 2014-02-27: 20 mL

## 2014-02-27 MED ORDER — ONDANSETRON HCL 4 MG/2ML IJ SOLN: 4.0000 mg | Freq: Four times a day (QID) | INTRAMUSCULAR | Status: DC | PRN

## 2014-02-27 MED ORDER — SUCCINYLCHOLINE CHLORIDE 20 MG/ML IJ SOLN
INTRAMUSCULAR | Status: DC | PRN
  Administered 2014-02-27: 100 mg via INTRAVENOUS

## 2014-02-27 MED ORDER — RALOXIFENE HCL 60 MG PO TABS
60.0000 mg | ORAL_TABLET | Freq: Every day | ORAL | Status: DC
  Filled 2014-02-27: qty 1

## 2014-02-27 MED ORDER — CHLORHEXIDINE GLUCONATE CLOTH 2 % EX PADS: 6.0000 | MEDICATED_PAD | Freq: Once | CUTANEOUS | Status: DC

## 2014-02-27 MED ORDER — SODIUM CHLORIDE 0.9 % IR SOLN
Status: DC | PRN
  Administered 2014-02-27: 1000 mL

## 2014-02-27 MED ORDER — NEOSTIGMINE METHYLSULFATE 1 MG/ML IJ SOLN
INTRAMUSCULAR | Status: AC
  Filled 2014-02-27: qty 10

## 2014-02-27 MED ORDER — ACETAMINOPHEN 650 MG RE SUPP: 650.0000 mg | Freq: Four times a day (QID) | RECTAL | Status: DC | PRN

## 2014-02-27 MED ORDER — DORZOLAMIDE HCL-TIMOLOL MAL 2-0.5 % OP SOLN
1.0000 [drp] | Freq: Two times a day (BID) | OPHTHALMIC | Status: DC
  Administered 2014-02-27 – 2014-03-03 (×8): 1 [drp] via OPHTHALMIC
  Filled 2014-02-27: qty 10

## 2014-02-27 MED ORDER — THROMBIN 5000 UNITS EX SOLR
CUTANEOUS | Status: DC | PRN
Start: 2014-02-27 — End: 2014-02-27
  Administered 2014-02-27: 5000 [IU] via TOPICAL

## 2014-02-27 MED ORDER — PHENOL 1.4 % MT LIQD: 1.0000 | OROMUCOSAL | Status: DC | PRN

## 2014-02-27 SURGICAL SUPPLY — 71 items
ADH SKN CLS APL DERMABOND .7 (GAUZE/BANDAGES/DRESSINGS) ×1
BAG SPEC THK2 15X12 ZIP CLS (MISCELLANEOUS) ×1
BAG ZIPLOCK 12X15 (MISCELLANEOUS) ×2 IMPLANT
BANDAGE ELASTIC 4 VELCRO ST LF (GAUZE/BANDAGES/DRESSINGS) ×2 IMPLANT
BANDAGE ELASTIC 6 VELCRO ST LF (GAUZE/BANDAGES/DRESSINGS) ×2 IMPLANT
BANDAGE ESMARK 6X9 LF (GAUZE/BANDAGES/DRESSINGS) ×1 IMPLANT
BLADE SAG 18X100X1.27 (BLADE) ×2 IMPLANT
BLADE SAW SGTL 11.0X1.19X90.0M (BLADE) ×2 IMPLANT
BNDG CMPR 9X6 STRL LF SNTH (GAUZE/BANDAGES/DRESSINGS) ×1
BNDG ESMARK 6X9 LF (GAUZE/BANDAGES/DRESSINGS) ×2
BONE CEMENT GENTAMICIN (Cement) ×4 IMPLANT
CAPT RP KNEE ×1 IMPLANT
CEMENT BONE GENTAMICIN 40 (Cement) ×2 IMPLANT
CUFF TOURN SGL QUICK 34 (TOURNIQUET CUFF) ×2
CUFF TRNQT CYL 34X4X40X1 (TOURNIQUET CUFF) ×1 IMPLANT
DERMABOND ADVANCED (GAUZE/BANDAGES/DRESSINGS) ×1
DERMABOND ADVANCED .7 DNX12 (GAUZE/BANDAGES/DRESSINGS) IMPLANT
DRAPE EXTREMITY T 121X128X90 (DRAPE) ×2 IMPLANT
DRAPE INCISE IOBAN 66X45 STRL (DRAPES) ×2 IMPLANT
DRAPE LG THREE QUARTER DISP (DRAPES) ×2 IMPLANT
DRAPE POUCH INSTRU U-SHP 10X18 (DRAPES) ×2 IMPLANT
DRAPE U-SHAPE 47X51 STRL (DRAPES) ×2 IMPLANT
DRSG AQUACEL AG ADV 3.5X10 (GAUZE/BANDAGES/DRESSINGS) ×1 IMPLANT
DRSG PAD ABDOMINAL 8X10 ST (GAUZE/BANDAGES/DRESSINGS) IMPLANT
DRSG TEGADERM 4X4.75 (GAUZE/BANDAGES/DRESSINGS) IMPLANT
DURAPREP 26ML APPLICATOR (WOUND CARE) ×2 IMPLANT
ELECT REM PT RETURN 9FT ADLT (ELECTROSURGICAL) ×2
ELECTRODE REM PT RTRN 9FT ADLT (ELECTROSURGICAL) ×1 IMPLANT
EVACUATOR 1/8 PVC DRAIN (DRAIN) ×2 IMPLANT
FACESHIELD WRAPAROUND (MASK) ×8 IMPLANT
FACESHIELD WRAPAROUND OR TEAM (MASK) ×5 IMPLANT
GAUZE SPONGE 2X2 8PLY STRL LF (GAUZE/BANDAGES/DRESSINGS) IMPLANT
GLOVE BIOGEL PI IND STRL 7.0 (GLOVE) IMPLANT
GLOVE BIOGEL PI IND STRL 7.5 (GLOVE) IMPLANT
GLOVE BIOGEL PI IND STRL 8 (GLOVE) ×1 IMPLANT
GLOVE BIOGEL PI INDICATOR 7.0 (GLOVE) ×2
GLOVE BIOGEL PI INDICATOR 7.5 (GLOVE) ×1
GLOVE BIOGEL PI INDICATOR 8 (GLOVE) ×1
GLOVE ECLIPSE 8.0 STRL XLNG CF (GLOVE) ×4 IMPLANT
GLOVE SURG SS PI 6.5 STRL IVOR (GLOVE) ×4 IMPLANT
GOWN BRE IMP SLV AUR XL STRL (GOWN DISPOSABLE) ×1 IMPLANT
GOWN STRL REUS W/TWL LRG LVL3 (GOWN DISPOSABLE) ×3 IMPLANT
GOWN STRL REUS W/TWL XL LVL3 (GOWN DISPOSABLE) ×3 IMPLANT
HANDPIECE INTERPULSE COAX TIP (DISPOSABLE) ×2
IMMOBILIZER KNEE 20 (SOFTGOODS) ×2 IMPLANT
KIT BASIN OR (CUSTOM PROCEDURE TRAY) ×2 IMPLANT
MANIFOLD NEPTUNE II (INSTRUMENTS) ×2 IMPLANT
NEEDLE HYPO 22GX1.5 SAFETY (NEEDLE) ×2 IMPLANT
NS IRRIG 1000ML POUR BTL (IV SOLUTION) IMPLANT
PACK TOTAL JOINT (CUSTOM PROCEDURE TRAY) ×2 IMPLANT
PADDING CAST COTTON 6X4 STRL (CAST SUPPLIES) IMPLANT
POSITIONER SURGICAL ARM (MISCELLANEOUS) ×2 IMPLANT
SET HNDPC FAN SPRY TIP SCT (DISPOSABLE) ×1 IMPLANT
SPONGE GAUZE 2X2 STER 10/PKG (GAUZE/BANDAGES/DRESSINGS)
SPONGE LAP 18X18 X RAY DECT (DISPOSABLE) IMPLANT
SPONGE SURGIFOAM ABS GEL 100 (HEMOSTASIS) ×2 IMPLANT
STAPLER VISISTAT 35W (STAPLE) IMPLANT
SUCTION FRAZIER 12FR DISP (SUCTIONS) ×2 IMPLANT
SUT BONE WAX W31G (SUTURE) ×2 IMPLANT
SUT MNCRL AB 4-0 PS2 18 (SUTURE) ×2 IMPLANT
SUT VIC AB 1 CT1 27 (SUTURE) ×6
SUT VIC AB 1 CT1 27XBRD ANTBC (SUTURE) ×2 IMPLANT
SUT VIC AB 2-0 CT1 27 (SUTURE) ×8
SUT VIC AB 2-0 CT1 TAPERPNT 27 (SUTURE) ×3 IMPLANT
SUT VLOC 180 0 24IN GS25 (SUTURE) ×2 IMPLANT
SYR 20CC LL (SYRINGE) ×2 IMPLANT
TOWEL OR 17X26 10 PK STRL BLUE (TOWEL DISPOSABLE) ×4 IMPLANT
TOWER CARTRIDGE SMART MIX (DISPOSABLE) ×2 IMPLANT
TRAY FOLEY CATH 14FRSI W/METER (CATHETERS) ×2 IMPLANT
WATER STERILE IRR 1500ML POUR (IV SOLUTION) ×4 IMPLANT
WRAP KNEE MAXI GEL POST OP (GAUZE/BANDAGES/DRESSINGS) ×2 IMPLANT

## 2014-02-27 NOTE — Progress Notes (Signed)
X-ray results noted 

## 2014-02-27 NOTE — Anesthesia Preprocedure Evaluation (Addendum)
Anesthesia Evaluation  Patient identified by MRN, date of birth, ID band Patient awake    Reviewed: Allergy & Precautions, H&P , NPO status , Patient's Chart, lab work & pertinent test results  History of Anesthesia Complications (+) history of anesthetic complications  Airway Mallampati: II TM Distance: >3 FB Neck ROM: Full    Dental no notable dental hx.    Pulmonary neg pulmonary ROS,  breath sounds clear to auscultation  Pulmonary exam normal       Cardiovascular negative cardio ROS  Rhythm:Regular Rate:Normal     Neuro/Psych negative neurological ROS  negative psych ROS   GI/Hepatic negative GI ROS, Neg liver ROS,   Endo/Other  negative endocrine ROS  Renal/GU negative Renal ROS  negative genitourinary   Musculoskeletal negative musculoskeletal ROS (+)   Abdominal (+) + obese,   Peds negative pediatric ROS (+)  Hematology negative hematology ROS (+)   Anesthesia Other Findings   Reproductive/Obstetrics negative OB ROS                           Anesthesia Physical Anesthesia Plan  ASA: II  Anesthesia Plan: General   Post-op Pain Management:    Induction: Intravenous  Airway Management Planned: Oral ETT  Additional Equipment:   Intra-op Plan:   Post-operative Plan: Extubation in OR  Informed Consent: I have reviewed the patients History and Physical, chart, labs and discussed the procedure including the risks, benefits and alternatives for the proposed anesthesia with the patient or authorized representative who has indicated his/her understanding and acceptance.   Dental advisory given  Plan Discussed with: CRNA  Anesthesia Plan Comments: (Discussed general versus spinal. Patient prefers general.)       Anesthesia Quick Evaluation

## 2014-02-27 NOTE — Anesthesia Postprocedure Evaluation (Signed)
  Anesthesia Post-op Note  Patient: Jaime [name]  Procedure(s) Performed: Procedure(s) (LRB): RIGHT TOTAL KNEE ARTHROPLASTY (Right)  Patient Location: PACU  Anesthesia Type: General  Level of Consciousness: awake and alert   Airway and Oxygen Therapy: Patient Spontanous Breathing  Post-op Pain: mild  Post-op Assessment: Post-op Vital signs reviewed, Patient's Cardiovascular Status Stable, Respiratory Function Stable, Patent Airway and No signs of Nausea or vomiting  Last Vitals:  Filed Vitals:   02/27/14 1700  BP: 179/57  Pulse: 88  Temp: 37 C  Resp: 13    Post-op Vital Signs: stable   Complications: No apparent anesthesia complications

## 2014-02-27 NOTE — Transfer of Care (Signed)
Immediate Anesthesia Transfer of Care Note  Patient: Jaime [name]  Procedure(s) Performed: Procedure(s) (LRB): RIGHT TOTAL KNEE ARTHROPLASTY (Right)  Patient Location: PACU  Anesthesia Type: General  Level of Consciousness: sedated, patient cooperative and responds to stimulation  Airway & Oxygen Therapy: Patient Spontanous Breathing and Patient connected to face mask oxgen  Post-op Assessment: Report given to PACU RN and Post -op Vital signs reviewed and stable  Post vital signs: Reviewed and stable  Complications: No apparent anesthesia complications

## 2014-02-27 NOTE — Progress Notes (Signed)
Patient admitted to [place], transferred from PACU, transferred on hospital bed, tolerated well, BP=183/69, P=91, Temp=97.9, Sats=99% on 2L/[place], foley cath draining clear yellow urine, Hemovac right knee draining bloody drainage, Knee Immobilizer in place on right knee, pain on scale of 1-10=10, skin dry and intact, lip dry, family at bedside, patient in stable condition at this time, will continue to monitor this shift

## 2014-02-27 NOTE — OR Nursing (Signed)
I documented against the wrong patient. Roberto Scales RN BSN

## 2014-02-27 NOTE — Interval H&P Note (Signed)
History and Physical Interval Note:  02/27/2014 12:50 PM  Jaime [name]  has presented today for surgery, with the diagnosis of Right Knee Osteoarthritis  The various methods of treatment have been discussed with the patient and family. After consideration of risks, benefits and other options for treatment, the patient has consented to  Procedure(s): RIGHT TOTAL KNEE ARTHROPLASTY (Right) as a surgical intervention .  The patient's history has been reviewed, patient examined, no change in status, stable for surgery.  I have reviewed the patient's chart and labs.  Questions were answered to the patient's satisfaction.     Tobi Bastos

## 2014-02-27 NOTE — Progress Notes (Signed)
Portable AP and Lateral Left Knee  X-RAYS done.

## 2014-02-27 NOTE — Op Note (Signed)
NAMEGUNDA, MAQUEDA                 ACCOUNT NO.:  [id number]  MEDICAL RECORD NO.:  [id number]  LOCATION:  3762                         FACILITY:  [facility]  PHYSICIAN:  Kipp Brood. Lachell Rochette, M.D.DATE OF BIRTH:  [birth year]  DATE OF PROCEDURE:  02/27/2014 DATE OF DISCHARGE:                              OPERATIVE REPORT   SURGEON:  Kipp Brood. Gladstone Lighter, M.D.  OPERATIVE ASSISTANT:  Ardeen Jourdain, PA  PREOPERATIVE DIAGNOSIS: 1. Flexion contracture, right knee. 2. Severe genu varus/valgus, right knee. 3. Severe osteoarthritis with bone-on-bone right knee.  POSTOPERATIVE DIAGNOSIS: 1. Flexion contracture, right knee. 2. Severe genu varus/valgus, right knee. 3. Severe osteoarthritis with bone-on-bone right knee.  OPERATION:  Right total knee arthroplasty.  The sizes used was a size 3 right DePuy system for the femoral component, posterior cruciate sacrificing, the tray was a size 3.  The insert was a size 3, 10 mm thickness.  The patella was a size 38, patella with 3 pegs.  I used gentamicin in the cement.  All 3 components were cemented.  PROCEDURE IN DETAIL:  Under general anesthesia, routine orthopedic prep and draping of the right lower extremity was carried out.  The appropriate time-out was carried out.  I also marked the appropriate right leg in the holding area.  At this time, the patient had 2 g of IV Ancef.  The leg was exsanguinated and Esmarch tourniquet was elevated to 325 mmHg.  The knee was flexed.  Anterior approach to the hip was carried out.  I then carried out, two flaps that were created. Following that, I did a median and parapatellar incision reflecting the patella laterally, flexed the knee and did medial and lateral meniscectomies and excised the anterior and posterior cruciate ligaments.  At this time, all spurs were removed.  Initial drill hole was made in the intercondylar notch.  I then thoroughly irrigated the area out after we inserted the canal finder.   Following that, I removed the appropriate amount of 14 mm thickness off the distal femur because of the severe contracture and severe valgus deformity.  At that time, we then went on and measured the femur to be a size 3.  We then cut our femoral shaft distally for the size 3 with anterior and posterior and chamfering cuts.  After that was prepared, I then went on and did the appropriate tibial plateau cut for a size 3 tray.  We then removed 8 mm thickness off the affected lateral side of the knee.  After doing that, we then inserted the lamina spreaders and removed all spurs.  We continued to release the soft tissue because of the contracture.  We then inserted our spacer blocks for a size 10 and it fit quite nicely. At this time, we then cut our keel cut out of the tibial plateau.  We then went on and made our notch cut out of the distal femur.  At that particular time, the femur then was prepared by cutting the notch cut out the femur in the usual fashion.  We then inserted our trial components, and had excellent fit with the 10 mm thickness, good flexion and extension and good medial lateral  stability.  I then did my resurfacing procedure on the patella for a 38 mm patella.  Three drill holes were made in the patella.  At that particular time, all components were removed.  I thoroughly water picked out the knee and cemented all 3 components in simultaneously.  Once the cement was hardened, I removed all loose pieces of cement.  Water picked out the knee.  At that particular point, we then tested our knee again with the 10.5 mm thickness and had an excellent fit.  We had good release as far as our contractures were concerned.  At that particular time, the trial component was removed from the tibia.  I water picked out the knee again and looked for loose pieces of cement.  I inserted the permanent rotating platform, and reduced the knee and had excellent function. Following that, I  irrigated the knee out again and I inserted a Hemovac drain and closed the wound layers in usual fashion.  Sterile dressings were applied.  As I mentioned, the patient had 2 g of IV Ancef.          ______________________________ Kipp Brood. Gladstone Lighter, M.D.     RAG/MEDQ  D:  02/27/2014  T:  02/27/2014  Job:  [id number]

## 2014-02-27 NOTE — Brief Op Note (Signed)
02/27/2014  3:10 PM  PATIENT:  [name]  80 y.o. female  PRE-OPERATIVE DIAGNOSIS:  Right Knee Osteoarthritis with flexion contracture  POST-OPERATIVE DIAGNOSIS:  right knee osteoarthritis with flexion contracture  PROCEDURE:  Procedure(s): RIGHT TOTAL KNEE ARTHROPLASTY (Right)  SURGEON:  Surgeon(s) and Role:    * Tobi Bastos, MD - Primary  PHYSICIAN ASSISTANT:Amber Westwood PA   ASSISTANTS: Ardeen Jourdain PA   ANESTHESIA:   general  EBL:  Total I/O In: 1000 [I.V.:1000] Out: -   BLOOD ADMINISTERED:none  DRAINS: (one) Hemovact drain(s) in the Right Knee with  Suction Open   LOCAL MEDICATIONS USED:  BUPIVICAINE 20cc mixed with 20cc of Normal Saline  SPECIMEN:  No Specimen  DISPOSITION OF SPECIMEN:  N/A  COUNTS:  YES  TOURNIQUET:    DICTATION: .Other Dictation: Dictation Number [id number]  PLAN OF CARE: Admit to inpatient   PATIENT DISPOSITION:  Stable in OR   Delay start of Pharmacological VTE agent (>24hrs) due to surgical blood loss or risk of bleeding: yes

## 2014-02-28 ENCOUNTER — Encounter (HOSPITAL_COMMUNITY): Payer: Self-pay | Admitting: Orthopedic Surgery

## 2014-02-28 DIAGNOSIS — D62 Acute posthemorrhagic anemia: Secondary | ICD-10-CM | POA: Diagnosis not present

## 2014-02-28 LAB — BASIC METABOLIC PANEL
BUN: 10 mg/dL (ref 6–23)
CO2: 26 mEq/L (ref 19–32)
CREATININE: 0.81 mg/dL (ref 0.50–1.10)
Calcium: 8.7 mg/dL (ref 8.4–10.5)
Chloride: 104 mEq/L (ref 96–112)
GFR calc non Af Amer: 67 mL/min — ABNORMAL LOW (ref >90)
GFR, EST AFRICAN AMERICAN: 78 mL/min — AB (ref >90)
Glucose, Bld: 130 mg/dL — ABNORMAL HIGH (ref 70–99)
Potassium: 4.5 mEq/L (ref 3.7–5.3)
Sodium: 139 mEq/L (ref 137–147)

## 2014-02-28 LAB — CBC
HCT: 32.6 % — ABNORMAL LOW (ref 36.0–46.0)
Hemoglobin: 10.8 g/dL — ABNORMAL LOW (ref 12.0–15.0)
MCH: 29.2 pg (ref 26.0–34.0)
MCHC: 33.1 g/dL (ref 30.0–36.0)
MCV: 88.1 fL (ref 78.0–100.0)
Platelets: 239 10*3/uL (ref 150–400)
RBC: 3.7 MIL/uL — ABNORMAL LOW (ref 3.87–5.11)
RDW: 12.8 % (ref 11.5–15.5)
WBC: 13.3 10*3/uL — ABNORMAL HIGH (ref 4.0–10.5)

## 2014-02-28 MED ORDER — OXYCODONE-ACETAMINOPHEN 5-325 MG PO TABS: 1.0000 | ORAL_TABLET | ORAL | Status: DC | PRN

## 2014-02-28 MED ORDER — METHOCARBAMOL 500 MG PO TABS: 500.0000 mg | ORAL_TABLET | Freq: Four times a day (QID) | ORAL | Status: DC | PRN

## 2014-02-28 MED ORDER — FERROUS SULFATE 325 (65 FE) MG PO TABS: 325.0000 mg | ORAL_TABLET | Freq: Three times a day (TID) | ORAL | Status: DC

## 2014-02-28 MED ORDER — RIVAROXABAN 10 MG PO TABS: 10.0000 mg | ORAL_TABLET | Freq: Every day | ORAL | Status: DC

## 2014-02-28 NOTE — Progress Notes (Signed)
   Subjective: 1 Day Post-Op Procedure(s) (LRB): RIGHT TOTAL KNEE ARTHROPLASTY (Right) Patient reports pain as mild.   Patient seen in rounds without Dr. Gladstone Lighter. Patient is well, and has had no acute complaints or problems. No issues overnight. She denies SOB and chest pain. She reports that she slept well last night. She is anxious to get started with therapy as she is concerned about maneuvering stairs once she gets home.  We will start therapy today.  Plan is to go Home after hospital stay.  Objective: Vital signs in last 24 hours: Temp:  [97.9 F (36.6 C)-99 F (37.2 C)] 99 F (37.2 C) (04/09 0349) Pulse Rate:  [76-93] 76 (04/09 0349) Resp:  [12-21] 17 (04/09 0349) BP: (131-189)/(55-78) 131/75 mmHg (04/09 0349) SpO2:  [96 %-100 %] 98 % (04/09 0349) Weight:  [94.62 kg (208 lb 9.6 oz)] 94.62 kg (208 lb 9.6 oz) (04/08 1819)  Intake/Output from previous day:  Intake/Output Summary (Last 24 hours) at 02/28/14 0752 Last data filed at 02/28/14 0500  Gross per 24 hour  Intake   2655 ml  Output   1155 ml  Net   1500 ml    Intake/Output this shift:    Labs:  Recent Labs  02/28/14 0430  HGB 10.8*    Recent Labs  02/28/14 0430  WBC 13.3*  RBC 3.70*  HCT 32.6*  PLT 239    Recent Labs  02/28/14 0430  NA 139  K 4.5  CL 104  CO2 26  BUN 10  CREATININE 0.81  GLUCOSE 130*  CALCIUM 8.7   No results found for this basename: LABPT, INR,  in the last 72 hours  EXAM General - Patient is Alert and Oriented Extremity - Neurologically intact Intact pulses distally Dorsiflexion/Plantar flexion intact Compartment soft Dressing - dressing C/D/I Motor Function - intact, moving foot and toes well on exam.  Hemovac pulled without difficulty.  Past Medical History  Diagnosis Date  . Complication of anesthesia     problem 40 yrs ago Doctor told her anesthesia drugs caused a reaction  . Arthritis     Assessment/Plan: 1 Day Post-Op Procedure(s) (LRB): RIGHT TOTAL  KNEE ARTHROPLASTY (Right) Active Problems:   Osteoarthritis of right knee   Total knee replacement status   Acute blood loss anemia  Estimated body mass index is 34.71 kg/(m^2) as calculated from the following:   Height as of this encounter: 5\' 5"  (1.651 m).   Weight as of this encounter: 94.62 kg (208 lb 9.6 oz). Advance diet Up with therapy D/C IV fluids once POs tolerated well Plan for discharge tomorrow with home health pending how therapy progresses  DVT Prophylaxis - Xarelto Weight-Bearing as tolerated  D/C O2 and Pulse OX and try on Room Air   She is doing excellent this morning. She will work with therapy today. I reassured her that therapy would help her with stairs before she returns home. She may be ready to go home tomorrow but we will see how therapy goes.   Jaime [name] Jaime [name] 02/28/2014, 7:52 AM

## 2014-02-28 NOTE — Care Management Note (Signed)
    Page 1 of 1   02/28/2014     2:07:39 PM   CARE MANAGEMENT NOTE 02/28/2014  Patient:  Jaime [name], Jaime [name]   Account Number:  [id number]  Date Initiated:  02/28/2014  Documentation initiated by:  [facility]  Subjective/Objective Assessment:   80 year old female admitted s/p Right total knee arthroplasty.     Action/Plan:   Home with [facility] services at d/c.   Anticipated DC Date:  03/02/2014   Anticipated DC Plan:  [place]         Choice offered to / List presented to:  C-1 Patient        [place] arranged  [place]   Status of service:  Completed, signed off Medicare Important Message given?  NA - LOS <3 / Initial given by admissions (If response is "NO", the following Medicare IM given date fields will be blank) Date Medicare IM given:   Date Additional Medicare IM given:    Discharge Disposition:  [place]  Per UR Regulation:  Reviewed for med. necessity/level of care/duration of stay  If discussed at [place] of Stay Meetings, dates discussed:    Comments:

## 2014-02-28 NOTE — Evaluation (Signed)
Occupational Therapy Evaluation Patient Details Name: Jaime [name] MRN: [id number] DOB: 10-30-1932 Today's Date: 02/28/2014    History of Present Illness s/p R TKA   Clinical Impression   Pt was admitted for the above surgery.  She will benefit from skilled OT to increase safety and independence with adls and bathroom transfers.  Will further assess need for 3:1 commode; she has a comfort height commode but will likely benefit from this being placed over it.  Prior to admission, pt was mod I with adls, although she struggled a little.  Goals in acute are for overall min guard to min A.    Follow Up Recommendations  Home health OT;Supervision/Assistance - 24 hour (likely, depending upon progress)    Equipment Recommendations   (to be further assessed for 3:1 commode)    Recommendations for Other Services       Precautions / Restrictions Precautions Precautions: Knee;Fall Required Braces or Orthoses: Knee Immobilizer - Right Knee Immobilizer - Right: Discontinue once straight leg raise with < 10 degree lag Restrictions Weight Bearing Restrictions: No Other Position/Activity Restrictions: WBAT      Mobility Bed Mobility Overal bed mobility: Needs Assistance;+2 for physical assistance Bed Mobility: Supine to Sit     Supine to sit: +2 for physical assistance;Mod assist     General bed mobility comments: cues for sequence and use of L LE to self assist; physical assist to manage R LE and to bring trunk to upright  Transfers Overall transfer level: Needs assistance Equipment used: Rolling walker (2 wheeled) Transfers: Sit to/from Stand Sit to Stand: Mod assist (from recliner)         General transfer comment: cues for UE/LE placement    Balance                                            ADL Overall ADL's : Needs assistance/impaired                                       General ADL Comments: Pt can perform UB adls with set up.   She needs mod A for LB adls and mod A for sit to stand, as she has a tendency to lean posteriorly.  Pt had just finished bathing, so simulated ADL, sit to stand.  Pt is able to reach to feet to start pants:  educated on sequence.  Performed toothbrushing from seated position.  Educated on [place], ice and towel roll under ankle (or pillow under calf) for pressure relief.     Vision                     Perception     Praxis      Pertinent Vitals/Pain Initially pain 1/10 in chair, R knee.  With weight bearing, pain increased but started going down when in chair.  Repositioned and ice applied     Hand Dominance Right   Extremity/Trunk Assessment Upper Extremity Assessment Upper Extremity Assessment: Overall WFL for tasks assessed      Cervical / Trunk Assessment Cervical / Trunk Assessment: Normal   Communication Communication Communication: No difficulties   Cognition Arousal/Alertness: Awake/alert Behavior During Therapy: WFL for tasks assessed/performed Overall Cognitive Status: Within Functional Limits for tasks assessed  General Comments       Exercises      Shoulder Instructions      Home Living Family/patient expects to be discharged to:: Private residence Living Arrangements: Spouse/significant other Available Help at Discharge: Family Type of Home: House Home Access: Stairs to enter Technical brewer of Steps: 3 Entrance Stairs-Rails: Right Home Layout: One level     Bathroom Shower/Tub: Occupational psychologist: Handicapped height     Home Equipment: Environmental consultant - 2 wheels   Additional Comments: has vanity next to comfort height commode      Prior Functioning/Environment Level of Independence: Independent;Independent with assistive device(s)             OT Diagnosis: Generalized weakness   OT Problem List: Decreased strength;Impaired balance (sitting and/or standing);Decreased knowledge of use of DME  or AE;Pain;Decreased knowledge of precautions   OT Treatment/Interventions: Self-care/ADL training;DME and/or AE instruction;Patient/family education;Balance training    OT Goals(Current goals can be found in the care plan section) Acute Rehab OT Goals Patient Stated Goal: Resume previous lifestyle with decreased pain OT Goal Formulation: With patient Time For Goal Achievement: 03/07/14 Potential to Achieve Goals: Good ADL Goals Pt Will Perform Grooming: with min guard assist;standing Pt Will Transfer to Toilet: with min assist;ambulating;bedside commode (vs comfort height with vanity) Pt Will Perform Toileting - Clothing Manipulation and hygiene: with min guard assist;sit to/from stand Pt Will Perform Tub/Shower Transfer: Shower transfer;with min assist;ambulating;shower seat;3 in 1  OT Frequency: Min 2X/week   Barriers to D/C:            Co-evaluation              End of Session    Activity Tolerance: Patient tolerated treatment well Patient left: in chair;with call bell/phone within reach;with family/visitor present   Time: 1300-1313 OT Time Calculation (min): 13 min Charges:  OT General Charges $OT Visit: 1 Procedure OT Evaluation $Initial OT Evaluation Tier I: 1 Procedure OT Treatments $Self Care/Home Management : 8-22 mins G-Codes:    Lesle Chris 04-Mar-2014, 1:29 PM   Lesle Chris, OTR/L [phone] March 04, 2014 Lesle Chris, OTR/L [phone] 04-Mar-2014

## 2014-02-28 NOTE — Progress Notes (Signed)
Utilization review completed.  

## 2014-02-28 NOTE — Evaluation (Signed)
Physical Therapy Evaluation Patient Details Name: Jaime [name] MRN: [id number] DOB: 11/09/1932 Today's Date: 02/28/2014   History of Present Illness     Clinical Impression  Pt s/p R TKR presents with decreased R LE strength/ROM and post op pain limiting functional mobility.  Pt should progress to d/c home with family assist and HHPT follow up.    Follow Up Recommendations Home health PT    Equipment Recommendations  None recommended by PT    Recommendations for Other Services OT consult     Precautions / Restrictions Precautions Precautions: Knee;Fall Required Braces or Orthoses: Knee Immobilizer - Right Knee Immobilizer - Right: Discontinue once straight leg raise with < 10 degree lag Restrictions Weight Bearing Restrictions: No Other Position/Activity Restrictions: WBAT      Mobility  Bed Mobility Overal bed mobility: Needs Assistance;+2 for physical assistance Bed Mobility: Supine to Sit     Supine to sit: +2 for physical assistance;Mod assist     General bed mobility comments: cues for sequence and use of L LE to self assist; physical assist to manage R LE and to bring trunk to upright  Transfers Overall transfer level: Needs assistance Equipment used: Rolling walker (2 wheeled) Transfers: Sit to/from Stand Sit to Stand: +2 physical assistance;Min assist;Mod assist         General transfer comment: cues for LE management and use of UEs to self assist  Ambulation/Gait Ambulation/Gait assistance: +2 safety/equipment;Min assist;Mod assist Ambulation Distance (Feet): 17 Feet Assistive device: Rolling walker (2 wheeled) Gait Pattern/deviations: Step-to pattern;Decreased step length - right;Decreased step length - left;Shuffle;Trunk flexed Gait velocity: decr   General Gait Details: cues for sequence, posture, position from RW and stride length  Stairs            Wheelchair Mobility    Modified Rankin (Stroke Patients Only)       Balance                                              Pertinent Vitals/Pain 6/10; premed, cold packs provided, RN aware    Home Living Family/patient expects to be discharged to:: Private residence Living Arrangements: Spouse/significant other Available Help at Discharge: Family Type of Home: House Home Access: Stairs to enter Entrance Stairs-Rails: Right Entrance Stairs-Number of Steps: 3 Home Layout: One level Home Equipment: Walker - 2 wheels      Prior Function Level of Independence: Independent;Independent with assistive device(s)               Hand Dominance   Dominant Hand: Right    Extremity/Trunk Assessment   Upper Extremity Assessment: Overall WFL for tasks assessed           Lower Extremity Assessment: RLE deficits/detail RLE Deficits / Details: 2/5 quads with AAROM at knee -10 - 35 with ++ muscle guarding    Cervical / Trunk Assessment: Normal  Communication   Communication: No difficulties  Cognition Arousal/Alertness: Awake/alert Behavior During Therapy: WFL for tasks assessed/performed Overall Cognitive Status: Within Functional Limits for tasks assessed                      General Comments      Exercises Total Joint Exercises Ankle Circles/Pumps: AROM;10 reps;Supine;Both Quad Sets: AROM;Both;10 reps;Supine Heel Slides: AAROM;10 reps;Supine;Right Straight Leg Raises: AAROM;Both;10 reps;Supine      Assessment/Plan    PT  Assessment Patient needs continued PT services  PT Diagnosis Difficulty walking   PT Problem List Decreased strength;Decreased range of motion;Decreased activity tolerance;Decreased mobility;Decreased knowledge of use of DME;Pain;Obesity  PT Treatment Interventions DME instruction;Gait training;Stair training;Functional mobility training;Therapeutic activities;Therapeutic exercise;Patient/family education   PT Goals (Current goals can be found in the Care Plan section) Acute Rehab PT Goals Patient  Stated Goal: Resume previous lifestyle with decreased pain PT Goal Formulation: With patient Time For Goal Achievement: 03/07/14 Potential to Achieve Goals: Good    Frequency 7X/week   Barriers to discharge        Co-evaluation               End of Session Equipment Utilized During Treatment: Gait belt Activity Tolerance: Patient tolerated treatment well Patient left: with call bell/phone within reach;in chair;with family/visitor present Nurse Communication: Mobility status         Time: 0932-3557 PT Time Calculation (min): 35 min   Charges:   PT Evaluation $Initial PT Evaluation Tier I: 1 Procedure PT Treatments $Gait Training: 8-22 mins $Therapeutic Exercise: 8-22 mins   PT G Codes:          [name] 02/28/2014, 12:28 PM

## 2014-02-28 NOTE — Plan of Care (Signed)
Problem: Consults Goal: Diagnosis- Total Joint Replacement Outcome: Completed/Met Date Met:  02/28/14 Primary Total Knee RIGHT

## 2014-02-28 NOTE — Progress Notes (Signed)
Physical Therapy Treatment Patient Details Name: Jaime [name] MRN: [id number] DOB: 1932-03-12 Today's Date: 02/28/2014    History of Present Illness s/p R TKA    PT Comments    Pt motivated and cooperative but ltd by c/o dizziness with ambulation - BP 123/65  Follow Up Recommendations  Home health PT     Equipment Recommendations  None recommended by PT    Recommendations for Other Services OT consult     Precautions / Restrictions Precautions Precautions: Knee;Fall Required Braces or Orthoses: Knee Immobilizer - Right Knee Immobilizer - Right: Discontinue once straight leg raise with < 10 degree lag Restrictions Weight Bearing Restrictions: No Other Position/Activity Restrictions: WBAT    Mobility  Bed Mobility Overal bed mobility: Needs Assistance;+2 for physical assistance Bed Mobility: Sit to Supine       Sit to supine: Min assist   General bed mobility comments: cues for sequence and use of L LE to self assist; physical assist to manage R LE and to bring trunk to upright  Transfers Overall transfer level: Needs assistance Equipment used: Rolling walker (2 wheeled) Transfers: Sit to/from Stand Sit to Stand: Mod assist         General transfer comment: cues for UE/LE placement  Ambulation/Gait Ambulation/Gait assistance: +2 safety/equipment;Min assist Ambulation Distance (Feet): 23 Feet Assistive device: Rolling walker (2 wheeled) Gait Pattern/deviations: Step-to pattern;Shuffle Gait velocity: decr   General Gait Details: cues for sequence, posture, position from RW and stride length   Stairs            Wheelchair Mobility    Modified Rankin (Stroke Patients Only)       Balance                                    Cognition Arousal/Alertness: Awake/alert Behavior During Therapy: WFL for tasks assessed/performed Overall Cognitive Status: Within Functional Limits for tasks assessed                       Exercises      General Comments        Pertinent Vitals/Pain 4/10; premed; ice packs provided    Home Living Family/patient expects to be discharged to:: Private residence Living Arrangements: Spouse/significant other Available Help at Discharge: Family Type of Home: House     Home Layout: One level Home Equipment: Walker - 2 wheels Additional Comments: has vanity next to comfort height commode    Prior Function Level of Independence: Independent;Independent with assistive device(s)          PT Goals (current goals can now be found in the care plan section) Acute Rehab PT Goals Patient Stated Goal: Resume previous lifestyle with decreased pain PT Goal Formulation: With patient Time For Goal Achievement: 03/07/14 Potential to Achieve Goals: Good Progress towards PT goals: Progressing toward goals    Frequency  7X/week    PT Plan Current plan remains appropriate    Co-evaluation             End of Session Equipment Utilized During Treatment: Gait belt Activity Tolerance: Patient tolerated treatment well Patient left: with call bell/phone within reach;in chair;with family/visitor present     Time: 1540-1555 PT Time Calculation (min): 15 min  Charges:  $Gait Training: 8-22 mins                    G Codes:  [name] 02/28/2014, 4:35 PM

## 2014-02-28 NOTE — Discharge Instructions (Addendum)
Walk with your walker. Weight bearing as instructed. [facility] will follow you at home for your therapy  Do not change your dressing unless there is excess drainage Shower only, no tub bath. Call if any temperatures greater than 101 or any wound complications: [phone] during the day and ask for Dr. Charlestine Night nurse, Brunilda Payor.   Information on my medicine - XARELTO (Rivaroxaban)  This medication education was reviewed with me or my healthcare representative as part of my discharge preparation.  The pharmacist that spoke with me during my hospital stay was:  Clovis Riley, [facility]  Why was Xarelto prescribed for you? Xarelto was prescribed for you to reduce the risk of blood clots forming after orthopedic surgery. The medical term for these abnormal blood clots is venous thromboembolism (VTE).  What do you need to know about xarelto ? Take your Xarelto ONCE DAILY at the same time every day. You may take it either with or without food.  If you have difficulty swallowing the tablet whole, you may crush it and mix in applesauce just prior to taking your dose.  Take Xarelto exactly as prescribed by your doctor and DO NOT stop taking Xarelto without talking to the doctor who prescribed the medication.  Stopping without other VTE prevention medication to take the place of Xarelto may increase your risk of developing a clot.  After discharge, you should have regular check-up appointments with your healthcare provider that is prescribing your Xarelto.    What do you do if you miss a dose? If you miss a dose, take it as soon as you remember on the same day then continue your regularly scheduled once daily regimen the next day. Do not take two doses of Xarelto on the same day.   Important Safety Information A possible side effect of Xarelto is bleeding. You should call your healthcare provider right away if you experience any of the following:   Bleeding from an injury  or your nose that does not stop.   Unusual colored urine (red or dark brown) or unusual colored stools (red or black).   Unusual bruising for unknown reasons.   A serious fall or if you hit your head (even if there is no bleeding).  Some medicines may interact with Xarelto and might increase your risk of bleeding while on Xarelto. To help avoid this, consult your healthcare provider or pharmacist prior to using any new prescription or non-prescription medications, including herbals, vitamins, non-steroidal anti-inflammatory drugs (NSAIDs) and supplements.  This website has more information on Xarelto: [email or web address]

## 2014-03-01 LAB — BASIC METABOLIC PANEL
BUN: 10 mg/dL (ref 6–23)
CHLORIDE: 105 meq/L (ref 96–112)
CO2: 26 meq/L (ref 19–32)
Calcium: 8.6 mg/dL (ref 8.4–10.5)
Creatinine, Ser: 0.83 mg/dL (ref 0.50–1.10)
GFR calc Af Amer: 76 mL/min — ABNORMAL LOW (ref >90)
GFR calc non Af Amer: 65 mL/min — ABNORMAL LOW (ref >90)
Glucose, Bld: 110 mg/dL — ABNORMAL HIGH (ref 70–99)
Potassium: 3.8 mEq/L (ref 3.7–5.3)
SODIUM: 140 meq/L (ref 137–147)

## 2014-03-01 LAB — CBC
HCT: 29.4 % — ABNORMAL LOW (ref 36.0–46.0)
Hemoglobin: 9.9 g/dL — ABNORMAL LOW (ref 12.0–15.0)
MCH: 29.3 pg (ref 26.0–34.0)
MCHC: 33.7 g/dL (ref 30.0–36.0)
MCV: 87 fL (ref 78.0–100.0)
Platelets: 224 10*3/uL (ref 150–400)
RBC: 3.38 MIL/uL — AB (ref 3.87–5.11)
RDW: 13 % (ref 11.5–15.5)
WBC: 13.2 10*3/uL — AB (ref 4.0–10.5)

## 2014-03-01 LAB — HEMOGLOBIN AND HEMATOCRIT, BLOOD
HCT: 28.9 % — ABNORMAL LOW (ref 36.0–46.0)
Hemoglobin: 9.8 g/dL — ABNORMAL LOW (ref 12.0–15.0)

## 2014-03-01 NOTE — Progress Notes (Signed)
OT Cancellation Note  Patient Details Name: JASMARIE COPPOCK MRN: [id number] DOB: 1932/04/26   Cancelled Treatment:    Reason Eval/Treat Not Completed: Other (comment).  Pt orthostatic with PT this am.  Will either try to check this pm or tomorrow.    Lesle Chris 03/01/2014, 11:49 AM Lesle Chris, OTR/L [phone] 03/01/2014

## 2014-03-01 NOTE — Progress Notes (Signed)
Subjective: 2 Days Post-Op Procedure(s) (LRB): RIGHT TOTAL KNEE ARTHROPLASTY (Right) Patient reports pain as 5 on 0-10 scale. Doing well except very slow in PT. Will DC in A.M.   Objective: Vital signs in last 24 hours: Temp:  [98 F (36.7 C)-100.1 F (37.8 C)] 99.3 F (37.4 C) (04/10 0520) Pulse Rate:  [74-86] 86 (04/10 0520) Resp:  [14-16] 16 (04/10 0520) BP: (107-163)/(50-71) 163/71 mmHg (04/10 0520) SpO2:  [93 %-100 %] 93 % (04/10 0520)  Intake/Output from previous day: 04/09 0701 - 04/10 0700 In: 2050 [P.O.:600; I.V.:1450] Out: 1750 [Urine:1750] Intake/Output this shift:     Recent Labs  02/28/14 0430 03/01/14 0515  HGB 10.8* 9.9*    Recent Labs  02/28/14 0430 03/01/14 0515  WBC 13.3* 13.2*  RBC 3.70* 3.38*  HCT 32.6* 29.4*  PLT 239 224    Recent Labs  02/28/14 0430 03/01/14 0515  NA 139 140  K 4.5 3.8  CL 104 105  CO2 26 26  BUN 10 10  CREATININE 0.81 0.83  GLUCOSE 130* 110*  CALCIUM 8.7 8.6   No results found for this basename: LABPT, INR,  in the last 72 hours  Dorsiflexion/Plantar flexion intact No cellulitis present  Assessment/Plan: 2 Days Post-Op Procedure(s) (LRB): RIGHT TOTAL KNEE ARTHROPLASTY (Right) Up with therapy Plan  on DC tomorrow.  Jaime [name] Jaime [name] 03/01/2014, 7:20 AM

## 2014-03-01 NOTE — Progress Notes (Signed)
Physical Therapy Treatment Patient Details Name: Jaime [name] MRN: [id number] DOB: [birth year] Today's Date: 03/01/2014    History of Present Illness s/p R TKA    PT Comments    Pt cooperative but ltd by dizziness with standing/ambulating.  BPs supine 164/74; sit 143/71; sit x 2 min 150/76; stand 118/56; after sitting in chair 104/61.  Follow Up Recommendations  Home health PT     Equipment Recommendations  None recommended by PT    Recommendations for Other Services OT consult     Precautions / Restrictions Precautions Precautions: Knee;Fall Required Braces or Orthoses: Knee Immobilizer - Right Knee Immobilizer - Right: Discontinue once straight leg raise with < 10 degree lag Restrictions Weight Bearing Restrictions: No Other Position/Activity Restrictions: WBAT    Mobility  Bed Mobility Overal bed mobility: Needs Assistance Bed Mobility: Supine to Sit     Supine to sit: Mod assist     General bed mobility comments: cues for sequence and use of L LE to self assist; physical assist to manage R LE and to bring trunk to upright  Transfers Overall transfer level: Needs assistance Equipment used: Rolling walker (2 wheeled) Transfers: Sit to/from Stand Sit to Stand: Mod assist         General transfer comment: cues for UE/LE placement - from bed, to/from [name] and to chair  Ambulation/Gait Ambulation/Gait assistance: +2 safety/equipment;Mod assist Ambulation Distance (Feet): 5 Feet (and 3') Assistive device: Rolling walker (2 wheeled) Gait Pattern/deviations: Step-to pattern;Shuffle;Trunk flexed;Decreased step length - right;Decreased step length - left Gait velocity: decr   General Gait Details: cues for sequence, posture, position from RW and stride length; pt ltd by c/o dizziness increasing with time up on feet   Stairs            Wheelchair Mobility    Modified Rankin (Stroke Patients Only)       Balance                                     Cognition Arousal/Alertness: Awake/alert Behavior During Therapy: WFL for tasks assessed/performed Overall Cognitive Status: Within Functional Limits for tasks assessed                      Exercises Total Joint Exercises Ankle Circles/Pumps: AROM;Supine;Both;20 reps Quad Sets: AROM;Both;Supine;15 reps Heel Slides: AAROM;Supine;Right;20 reps Straight Leg Raises: AAROM;Both;Supine;20 reps    General Comments        Pertinent Vitals/Pain 5/10; premed, ice packs provided; orthostatic as above in comment section    Home Living                      Prior Function            PT Goals (current goals can now be found in the care plan section) Acute Rehab PT Goals Patient Stated Goal: Resume previous lifestyle with decreased pain PT Goal Formulation: With patient Time For Goal Achievement: 03/07/14 Potential to Achieve Goals: Good Progress towards PT goals: Not progressing toward goals - comment (orthostatic)    Frequency  7X/week    PT Plan Current plan remains appropriate    Co-evaluation             End of Session Equipment Utilized During Treatment: Gait belt;Right knee immobilizer Activity Tolerance: Other (comment) (limited by dizziness with OOB) Patient left: with call bell/phone within reach;in chair;with family/visitor present  Time: 0810-0852 PT Time Calculation (min): 42 min  Charges:  $Gait Training: 8-22 mins $Therapeutic Exercise: 8-22 mins $Therapeutic Activity: 8-22 mins                    G Codes:      [name] 03/25/14, 11:36 AM

## 2014-03-01 NOTE — Progress Notes (Signed)
Physical Therapy Treatment Patient Details Name: Jaime [name] MRN: [id number] DOB: 1932/11/22 Today's Date: 03/01/2014    History of Present Illness s/p R TKA    PT Comments    Pt continues ltd by fatigue and c/o dizziness with time in standing/ambulating  Follow Up Recommendations  Home health PT     Equipment Recommendations  None recommended by PT    Recommendations for Other Services OT consult     Precautions / Restrictions Precautions Precautions: Knee;Fall Required Braces or Orthoses: Knee Immobilizer - Right Knee Immobilizer - Right: Discontinue once straight leg raise with < 10 degree lag Restrictions Weight Bearing Restrictions: No Other Position/Activity Restrictions: WBAT    Mobility  Bed Mobility Overal bed mobility: Needs Assistance Bed Mobility: Sit to Supine     Supine to sit: Mod assist Sit to supine: Min assist   General bed mobility comments: cues for sequence and use of L LE to self assist  Transfers Overall transfer level: Needs assistance Equipment used: Rolling walker (2 wheeled) Transfers: Sit to/from Stand Sit to Stand: Mod assist;+2 safety/equipment         General transfer comment: cues for LE management and use of UEs to self assist  Ambulation/Gait Ambulation/Gait assistance: +2 safety/equipment;Mod assist Ambulation Distance (Feet): 5 Feet Assistive device: Rolling walker (2 wheeled) Gait Pattern/deviations: Step-to pattern;Decreased step length - right;Decreased step length - left;Shuffle;Trunk flexed Gait velocity: decr   General Gait Details: cues for sequence, posture, position from RW and stride length; pt ltd by c/o dizziness increasing with time up on feet   Stairs            Wheelchair Mobility    Modified Rankin (Stroke Patients Only)       Balance                                    Cognition Arousal/Alertness: Awake/alert Behavior During Therapy: WFL for tasks  assessed/performed Overall Cognitive Status: Within Functional Limits for tasks assessed                      Exercises Total Joint Exercises Ankle Circles/Pumps: AROM;Supine;Both;20 reps Quad Sets: AROM;Both;Supine;15 reps Heel Slides: AAROM;Supine;Right;20 reps Straight Leg Raises: AAROM;Both;Supine;20 reps    General Comments        Pertinent Vitals/Pain 4/10; ice pack provided    Home Living                      Prior Function            PT Goals (current goals can now be found in the care plan section) Acute Rehab PT Goals Patient Stated Goal: Resume previous lifestyle with decreased pain PT Goal Formulation: With patient Time For Goal Achievement: 03/07/14 Potential to Achieve Goals: Good Progress towards PT goals: Progressing toward goals    Frequency  7X/week    PT Plan Current plan remains appropriate    Co-evaluation             End of Session Equipment Utilized During Treatment: Gait belt;Right knee immobilizer Activity Tolerance: Other (comment) (dizzy ) Patient left: in bed;with call bell/phone within reach;with family/visitor present     Time: 2956-2130 PT Time Calculation (min): 8 min  Charges:  $Gait Training: 8-22 mins $Therapeutic Exercise: 8-22 mins $Therapeutic Activity: 8-22 mins  G Codes:      [name] 03/20/14, 11:42 AM

## 2014-03-01 NOTE — Progress Notes (Signed)
Physical Therapy Treatment Patient Details Name: Jaime [name] MRN: [id number] DOB: 1932-07-15 Today's Date: 03/01/2014    History of Present Illness s/p R TKA    PT Comments    Pt continues motivated but ltd by fatigue and dizziness with attempts at OOB activity.  Orthostatic BP this session  Supine 142/68; sit 137/71, stand 108/62.  Nursing aware  Follow Up Recommendations  Home health PT     Equipment Recommendations  None recommended by PT    Recommendations for Other Services OT consult     Precautions / Restrictions Precautions Precautions: Knee;Fall Required Braces or Orthoses: Knee Immobilizer - Right Knee Immobilizer - Right: Discontinue once straight leg raise with < 10 degree lag Restrictions Weight Bearing Restrictions: No Other Position/Activity Restrictions: WBAT    Mobility  Bed Mobility Overal bed mobility: Needs Assistance Bed Mobility: Supine to Sit       Sit to supine: Mod assist   General bed mobility comments: cues for sequence and use of L LE to self assist  Transfers Overall transfer level: Needs assistance Equipment used: Rolling walker (2 wheeled) Transfers: Sit to/from Stand Sit to Stand: Mod assist;+2 safety/equipment         General transfer comment: cues for LE management and use of UEs to self assist  Ambulation/Gait Ambulation/Gait assistance: +2 physical assistance;Mod assist Ambulation Distance (Feet): 4 Feet (twice) Assistive device: Rolling walker (2 wheeled) Gait Pattern/deviations: Step-to pattern Gait velocity: decr   General Gait Details: cues for sequence, posture, position from RW and stride length; pt ltd by c/o dizziness increasing with time up on feet   Stairs            Wheelchair Mobility    Modified Rankin (Stroke Patients Only)       Balance                                    Cognition Arousal/Alertness: Awake/alert Behavior During Therapy: WFL for tasks  assessed/performed Overall Cognitive Status: Within Functional Limits for tasks assessed                      Exercises      General Comments        Pertinent Vitals/Pain 4/10; premed,     Home Living                      Prior Function            PT Goals (current goals can now be found in the care plan section) Acute Rehab PT Goals Patient Stated Goal: Resume previous lifestyle with decreased pain PT Goal Formulation: With patient Time For Goal Achievement: 03/07/14 Potential to Achieve Goals: Good Progress towards PT goals: Not progressing toward goals - comment (dizzy with OOB activity)    Frequency  7X/week    PT Plan Current plan remains appropriate    Co-evaluation             End of Session Equipment Utilized During Treatment: Gait belt;Right knee immobilizer Activity Tolerance: Patient limited by fatigue;Other (comment) (dizzy) Patient left: in bed;with call bell/phone within reach;with family/visitor present     Time: 4818-5631 PT Time Calculation (min): 30 min  Charges:  $Gait Training: 8-22 mins $Therapeutic Activity: 8-22 mins                    G Codes:  Jaime [name] 03/01/2014, 3:15 PM

## 2014-03-02 LAB — CBC
HEMATOCRIT: 28.1 % — AB (ref 36.0–46.0)
Hemoglobin: 9.2 g/dL — ABNORMAL LOW (ref 12.0–15.0)
MCH: 28.9 pg (ref 26.0–34.0)
MCHC: 32.7 g/dL (ref 30.0–36.0)
MCV: 88.4 fL (ref 78.0–100.0)
PLATELETS: 208 10*3/uL (ref 150–400)
RBC: 3.18 MIL/uL — AB (ref 3.87–5.11)
RDW: 13.1 % (ref 11.5–15.5)
WBC: 13.4 10*3/uL — AB (ref 4.0–10.5)

## 2014-03-02 NOTE — Progress Notes (Signed)
Occupational Therapy Treatment Patient Details Name: Jaime [name] MRN: [id number] DOB: 05-12-1932 Today's Date: 03/02/2014    History of present illness s/p R TKA   OT comments  This 80 yo female making progress albeit slowly due to increased dizziness the longer she is up on her feet. Will benefit from follow up OT at home.  Follow Up Recommendations  Home health OT;Supervision/Assistance - 24 hour    Equipment Recommendations  3 in 1 bedside comode       Precautions / Restrictions Precautions Precautions: Fall Required Braces or Orthoses: Knee Immobilizer - Right Knee Immobilizer - Right: Discontinue once straight leg raise with < 10 degree lag Restrictions Weight Bearing Restrictions: No       Mobility Bed Mobility Overal bed mobility: Needs Assistance Bed Mobility: Supine to Sit     Supine to sit: Min assist     General bed mobility comments: cues for squencing and A for LLE  Transfers Overall transfer level: Needs assistance Equipment used: Rolling walker (2 wheeled) Transfers: Sit to/from Stand Sit to Stand: Min assist         General transfer comment: Cues for safe hand placement        ADL                                         General ADL Comments: Pt was min A for shower stall transfer to 3n1 and also for transfer to 3n1 over toilet. Min guard A for toileting hygiene sit<>stand. Husband to A with LB ADLS until pt can do for herself                Cognition   Behavior During Therapy: WFL for tasks assessed/performed Overall Cognitive Status: Within Functional Limits for tasks assessed                                    Pertinent Vitals/ Pain       7/10 in RLE; repositioned and made RN aware         Frequency Min 2X/week     Progress Toward Goals  OT Goals(current goals can now be found in the care plan section)  Progress towards OT goals: Progressing toward goals     Plan Discharge plan remains  appropriate       End of Session Equipment Utilized During Treatment: Gait belt;Rolling walker   Activity Tolerance  (limited by dizziness and pain)   Patient Left in bed;with call bell/phone within reach;with nursing/sitter in room;with family/visitor present   Nurse Communication Patient requests pain meds        Time: 1108-1200 OT Time Calculation (min): 52 min  Charges: OT General Charges $OT Visit: 1 Procedure OT Treatments $Self Care/Home Management : 38-52 mins  Almon Register [phone] 03/02/2014, 12:12 PM

## 2014-03-02 NOTE — Progress Notes (Signed)
Jaime [name]  MRN: [id number] DOB/Age: [birth year] 78 y.o. Physician: Ander Slade, M.D. 3 Days Post-Op Procedure(s) (LRB): RIGHT TOTAL KNEE ARTHROPLASTY (Right)  Subjective: Reports moderate discomfort, continued "light headed" with upright activities. Vital Signs Temp:  [98.9 F (37.2 C)-100.6 F (38.1 C)] 99.9 F (37.7 C) (04/11 0616) Pulse Rate:  [82-98] 82 (04/11 0616) Resp:  [16-20] 20 (04/11 0616) BP: (142-165)/(54-68) 147/54 mmHg (04/11 0616) SpO2:  [94 %-99 %] 94 % (04/11 0616)  Lab Results  Recent Labs  03/01/14 0515 03/01/14 1717 03/02/14 0515  WBC 13.2*  --  13.4*  HGB 9.9* 9.8* 9.2*  HCT 29.4* 28.9* 28.1*  PLT 224  --  208   BMET  Recent Labs  02/28/14 0430 03/01/14 0515  NA 139 140  K 4.5 3.8  CL 104 105  CO2 26 26  GLUCOSE 130* 110*  BUN 10 10  CREATININE 0.81 0.83  CALCIUM 8.7 8.6   INR  Date Value Ref Range Status  02/21/2014 1.07  0.00 - 1.49 Final     Exam  Right knee dressing intact with scant dried blood, moderate bruising about knee and leg, n/v intact distally, fair quad tone.  Plan Continued mildly orthostatic but improved from yesterday. Will hold d/c until tomorrow to allow for improved mobility tolerance and resolution of orthostatic response.  Metta Clines Chloe Bluett 03/02/2014, 8:38 AM

## 2014-03-02 NOTE — Progress Notes (Signed)
Physical Therapy Treatment Patient Details Name: Jaime [name] MRN: [id number] DOB: Sep 07, 1932 Today's Date: 03/02/2014    History of Present Illness s/p R TKA    PT Comments    Pt progressing slowly, continues ltd by fatigue and dizziness with ambulation.  Pt continues orthostatic but with decreased severity.   Follow Up Recommendations  Home health PT     Equipment Recommendations  None recommended by PT    Recommendations for Other Services OT consult     Precautions / Restrictions Precautions Precautions: Fall Required Braces or Orthoses: Knee Immobilizer - Right Knee Immobilizer - Right: Discontinue once straight leg raise with < 10 degree lag Restrictions Weight Bearing Restrictions: No    Mobility  Bed Mobility Overal bed mobility: Needs Assistance Bed Mobility: Supine to Sit     Supine to sit: Min assist     General bed mobility comments: cues for squencing and A for LLE  Transfers Overall transfer level: Needs assistance Equipment used: Rolling walker (2 wheeled) Transfers: Sit to/from Stand Sit to Stand: Min assist         General transfer comment: Cues for safe hand placement  Ambulation/Gait Ambulation/Gait assistance: Min assist Ambulation Distance (Feet): 32 Feet Assistive device: Rolling walker (2 wheeled) Gait Pattern/deviations: Step-to pattern;Decreased step length - right;Decreased step length - left;Shuffle;Trunk flexed Gait velocity: decr   General Gait Details: cues for sequence, posture, position from RW and stride length; pt ltd by c/o dizziness increasing with time up on feet   Stairs            Wheelchair Mobility    Modified Rankin (Stroke Patients Only)       Balance                                    Cognition Arousal/Alertness: Awake/alert Behavior During Therapy: WFL for tasks assessed/performed Overall Cognitive Status: Within Functional Limits for tasks assessed                       Exercises Total Joint Exercises Ankle Circles/Pumps: AROM;Supine;Both;20 reps Quad Sets: AROM;Both;Supine;15 reps Heel Slides: AAROM;Supine;Right;20 reps Straight Leg Raises: AAROM;Both;Supine;20 reps    General Comments        Pertinent Vitals/Pain 4/10; premed, ice pack provided    Home Living                      Prior Function            PT Goals (current goals can now be found in the care plan section) Acute Rehab PT Goals Patient Stated Goal: Resume previous lifestyle with decreased pain PT Goal Formulation: With patient Time For Goal Achievement: 03/07/14 Potential to Achieve Goals: Good Progress towards PT goals: Progressing toward goals    Frequency  7X/week    PT Plan Current plan remains appropriate    Co-evaluation             End of Session Equipment Utilized During Treatment: Gait belt;Right knee immobilizer Activity Tolerance: Patient limited by fatigue;Other (comment) Patient left: in chair;with call bell/phone within reach;with family/visitor present     Time: 1322-1400 PT Time Calculation (min): 38 min  Charges:  $Gait Training: 8-22 mins $Therapeutic Exercise: 8-22 mins                    G Codes:      Jeweline Reif  P Moyses Pavey 03/02/2014, 3:02 PM

## 2014-03-02 NOTE — Plan of Care (Signed)
Problem: Phase III Progression Outcomes Goal: Anticoagulant follow-up in place Outcome: Not Applicable Date Met:  96/70/00 Xarelto VTE, no f/u needed

## 2014-03-02 NOTE — Progress Notes (Signed)
03/02/2014 0930 NCM spoke to pt and offered choice for [facility]. Pt requested [facility] for [facility]. Pt states she has RW at home. Requesting 3n1 for home. [facility] notified for DME. Jonnie Finner RN CCM Case Mgmt phone [phone]

## 2014-03-02 NOTE — Progress Notes (Signed)
Physical Therapy Treatment Patient Details Name: Jaime [name] MRN: [id number] DOB: 12/20/1931 Today's Date: 03/02/2014    History of Present Illness s/p R TKA    PT Comments    Pt continues ltd by dizziness and fatigue with OOB activity.  RN and physician aware  Follow Up Recommendations  Home health PT     Equipment Recommendations  None recommended by PT    Recommendations for Other Services OT consult     Precautions / Restrictions Precautions Precautions: Knee;Fall Required Braces or Orthoses: Knee Immobilizer - Right Knee Immobilizer - Right: Discontinue once straight leg raise with < 10 degree lag Restrictions Weight Bearing Restrictions: No Other Position/Activity Restrictions: WBAT    Mobility  Bed Mobility Overal bed mobility: Needs Assistance Bed Mobility: Supine to Sit     Supine to sit: Min assist     General bed mobility comments: cues for sequence and use of L LE to self assist  Transfers Overall transfer level: Needs assistance Equipment used: Rolling walker (2 wheeled) Transfers: Sit to/from Stand Sit to Stand: Min assist;Mod assist         General transfer comment: cues for LE management and use of UEs to self assist  Ambulation/Gait Ambulation/Gait assistance: Min assist;Mod assist Ambulation Distance (Feet): 10 Feet (and 4) Assistive device: Rolling walker (2 wheeled) Gait Pattern/deviations: Step-to pattern Gait velocity: decr   General Gait Details: cues for sequence, posture, position from RW and stride length; pt ltd by c/o dizziness increasing with time up on feet   Stairs            Wheelchair Mobility    Modified Rankin (Stroke Patients Only)       Balance                                    Cognition Arousal/Alertness: Awake/alert Behavior During Therapy: WFL for tasks assessed/performed Overall Cognitive Status: Within Functional Limits for tasks assessed                       Exercises Total Joint Exercises Ankle Circles/Pumps: AROM;Supine;Both;20 reps Quad Sets: AROM;Both;Supine;15 reps Heel Slides: AAROM;Supine;Right;5 reps (pain limited) Straight Leg Raises: AAROM;Both;Supine;20 reps Goniometric ROM: Min flex tolerated 2* pain    General Comments        Pertinent Vitals/Pain 10/10 with attempts at knee flex.  Pt has been declining meds thinking it was causing dizziness.  Meds requested.  Cold packs provided.    Home Living                      Prior Function            PT Goals (current goals can now be found in the care plan section) Acute Rehab PT Goals Patient Stated Goal: Resume previous lifestyle with decreased pain PT Goal Formulation: With patient Time For Goal Achievement: 03/07/14 Potential to Achieve Goals: Good Progress towards PT goals: Progressing toward goals    Frequency  7X/week    PT Plan Current plan remains appropriate    Co-evaluation             End of Session Equipment Utilized During Treatment: Gait belt;Right knee immobilizer Activity Tolerance: Patient limited by fatigue;Other (comment) Patient left: in chair;with call bell/phone within reach;with family/visitor present     Time: 0805-0855 PT Time Calculation (min): 50 min  Charges:  $Gait Training: 8-22  mins $Therapeutic Exercise: 8-22 mins $Therapeutic Activity: 8-22 mins                    G Codes:      [name] March 18, 2014, 11:51 AM

## 2014-03-03 NOTE — Progress Notes (Signed)
Physical Therapy Treatment Patient Details Name: Jaime [name] MRN: [id number] DOB: November 27, 1931 Today's Date: 03/03/2014    History of Present Illness s/p R TKA    PT Comments    Patient able to demonstrate stair negotiation with family assist safely.  Also discussed transfer into/out of van and how to prep for HHPT.  Reports only minimal pain and though anxious about d/c due to hypotension was not hypotensive today.  Patient ready for d/c home; RN aware.  Follow Up Recommendations  Home health PT     Equipment Recommendations  None recommended by PT    Recommendations for Other Services       Precautions / Restrictions Precautions Precautions: Fall Required Braces or Orthoses: Knee Immobilizer - Right Knee Immobilizer - Right: Discontinue once straight leg raise with < 10 degree lag Restrictions Weight Bearing Restrictions: No Other Position/Activity Restrictions: WBAT    Mobility  Bed Mobility Overal bed mobility: Needs Assistance Bed Mobility: Supine to Sit     Supine to sit: Min assist     General bed mobility comments: cues for squencing and A for RLE  Transfers Overall transfer level: Needs assistance Equipment used: Rolling walker (2 wheeled)   Sit to Stand: Min assist         General transfer comment: Cues for safe hand placement; and management of RLE  Ambulation/Gait Ambulation/Gait assistance: Min guard;Supervision Ambulation Distance (Feet): 80 Feet Assistive device: Rolling walker (2 wheeled) Gait Pattern/deviations: Step-to pattern;Trunk flexed;Antalgic     General Gait Details: cues for sequence, increased time to stand prior to ambulation for orthostatic precautions (though pt not orthostatic BP 155/69 sitting)   Stairs Stairs: Yes Stairs assistance: Min assist Stair Management: Forwards;Step to pattern;One rail Left (with hand hold assist on right) Number of Stairs: 3 (x 2) General stair comments: practiced once with PT assist, then  with daughter assist  Wheelchair Mobility    Modified Rankin (Stroke Patients Only)       Balance Overall balance assessment: Needs assistance         Standing balance support: Bilateral upper extremity supported Standing balance-Leahy Scale: Poor Standing balance comment: UE support for balance                    Cognition Arousal/Alertness: Awake/alert Behavior During Therapy: WFL for tasks assessed/performed Overall Cognitive Status: Within Functional Limits for tasks assessed                      Exercises Total Joint Exercises Ankle Circles/Pumps: AROM;Supine;Both;20 reps Quad Sets: AROM;Both;Supine;10 reps Heel Slides: AAROM;Supine;Right;10 reps Straight Leg Raises: AAROM;Both;Supine;10 reps Goniometric ROM: flexion approx to 50 degrees supine AAROM    General Comments General comments (skin integrity, edema, etc.): educated pt and family on car transfers into minivan      Pertinent Vitals/Pain 2/10 right knee    Home Living                      Prior Function            PT Goals (current goals can now be found in the care plan section) Progress towards PT goals: Progressing toward goals    Frequency  7X/week    PT Plan Current plan remains appropriate    Co-evaluation             End of Session Equipment Utilized During Treatment: Gait belt;Right knee immobilizer Activity Tolerance: Patient tolerated treatment well Patient left: in  chair;with call bell/phone within reach;with family/visitor present     Time: 0910-0937 PT Time Calculation (min): 27 min  Charges:  $Gait Training: 8-22 mins $Therapeutic Exercise: 8-22 mins                    G Codes:      Max Sane 04-Mar-2014, 11:43 AM Magda Kiel, PT [phone] Mar 04, 2014

## 2014-03-03 NOTE — Progress Notes (Signed)
03/03/2014 1030 NCM spoke to pt and she is requesting [place] aide. NCM will notify Ar[facility] Go of new order. Jonnie Finner RN CCM Case Mgmt phone [phone]

## 2014-03-03 NOTE — Progress Notes (Signed)
Subjective: 4 Days Post-Op Procedure(s) (LRB): RIGHT TOTAL KNEE ARTHROPLASTY (Right) Patient reports pain as 3 on 0-10 scale. Her main issue preventing her DC yesterday has been her intermittent Hypotension when attempting PT. We are trying to go as slowly as possible whengetting her up from a sitting position. She still needs stair training.Her Hbg has been stable .Hbg today is 9.2 and last night was 9.8.We will once again try to DC her today.   Objective: Vital signs in last 24 hours: Temp:  [98.2 F (36.8 C)-99.3 F (37.4 C)] 99 F (37.2 C) (04/12 0500) Pulse Rate:  [74-80] 78 (04/12 0500) Resp:  [18-20] 20 (04/12 0500) BP: (118-136)/(51-69) 118/69 mmHg (04/12 0500) SpO2:  [94 %-97 %] 97 % (04/12 0500)  Intake/Output from previous day: 04/11 0701 - 04/12 0700 In: 640 [P.O.:640] Out: 450 [Urine:450] Intake/Output this shift:     Recent Labs  03/01/14 0515 03/01/14 1717 03/02/14 0515  HGB 9.9* 9.8* 9.2*    Recent Labs  03/01/14 0515 03/01/14 1717 03/02/14 0515  WBC 13.2*  --  13.4*  RBC 3.38*  --  3.18*  HCT 29.4* 28.9* 28.1*  PLT 224  --  208    Recent Labs  03/01/14 0515  NA 140  K 3.8  CL 105  CO2 26  BUN 10  CREATININE 0.83  GLUCOSE 110*  CALCIUM 8.6   No results found for this basename: LABPT, INR,  in the last 72 hours  Dorsiflexion/Plantar flexion intact  Assessment/Plan: 4 Days Post-Op Procedure(s) (LRB): RIGHT TOTAL KNEE ARTHROPLASTY (Right) Up with therapy Discharge home with home health  Tobi Bastos 03/03/2014, 7:51 AM

## 2014-03-03 NOTE — Progress Notes (Signed)
Discharged from floor via w/c, family with pt. No changes in assessment. Pardeep Pautz  

## 2014-03-04 NOTE — Discharge Summary (Signed)
Physician Discharge Summary   Patient ID: Jaime [name] MRN: [id number] DOB/AGE: May 08, 1932 80 y.o.  Admit date: 02/27/2014 Discharge date: 03/03/2014  Primary Diagnosis: Osteoarthritis, right knee    S/P right total knee arthroplasty  Admission Diagnoses:  Past Medical History  Diagnosis Date  . Complication of anesthesia     problem 40 yrs ago Doctor told her anesthesia drugs caused a reaction  . Arthritis    Discharge Diagnoses:   Active Problems:   Osteoarthritis of right knee   Total knee replacement status   Acute blood loss anemia  Estimated body mass index is 34.71 kg/(m^2) as calculated from the following:   Height as of this encounter: 5' 5"  (1.651 m).   Weight as of this encounter: 94.62 kg (208 lb 9.6 oz).  Procedure:  Procedure(s) (LRB): RIGHT TOTAL KNEE ARTHROPLASTY (Right)   Consults: None  HPI: Jaime [name], 80 y.o. female, has a history of pain and functional disability in the right knee due to arthritis and has failed non-surgical conservative treatments for greater than 12 weeks to includeNSAID's and/or analgesics, corticosteriod injections, use of assistive devices and activity modification. Onset of symptoms was gradual, starting >10 years ago with gradually worsening course since that time. The patient noted prior procedures on the knee to include arthroscopy and menisectomy on the right knee(s). Patient currently rates pain in the right knee(s) at 7 out of 10 with activity. Patient has night pain, worsening of pain with activity and weight bearing, pain that interferes with activities of daily living, pain with passive range of motion, crepitus and joint swelling. Patient has evidence of periarticular osteophytes and joint space narrowing by imaging studies.There is no active infection.   Laboratory Data: Admission on 02/27/2014, Discharged on 03/03/2014  Component Date Value Ref Range Status  . WBC 02/28/2014 13.3* 4.0 - 10.5 K/uL Final  . RBC 02/28/2014  3.70* 3.87 - 5.11 MIL/uL Final  . Hemoglobin 02/28/2014 10.8* 12.0 - 15.0 g/dL Final  . HCT 02/28/2014 32.6* 36.0 - 46.0 % Final  . MCV 02/28/2014 88.1  78.0 - 100.0 fL Final  . MCH 02/28/2014 29.2  26.0 - 34.0 pg Final  . MCHC 02/28/2014 33.1  30.0 - 36.0 g/dL Final  . RDW 02/28/2014 12.8  11.5 - 15.5 % Final  . Platelets 02/28/2014 239  150 - 400 K/uL Final  . Sodium 02/28/2014 139  137 - 147 mEq/L Final  . Potassium 02/28/2014 4.5  3.7 - 5.3 mEq/L Final  . Chloride 02/28/2014 104  96 - 112 mEq/L Final  . CO2 02/28/2014 26  19 - 32 mEq/L Final  . Glucose, Bld 02/28/2014 130* 70 - 99 mg/dL Final  . BUN 02/28/2014 10  6 - 23 mg/dL Final  . Creatinine, Ser 02/28/2014 0.81  0.50 - 1.10 mg/dL Final  . Calcium 02/28/2014 8.7  8.4 - 10.5 mg/dL Final  . GFR calc non Af Amer 02/28/2014 67* >90 mL/min Final  . GFR calc Af Amer 02/28/2014 78* >90 mL/min Final   Comment: (NOTE)                          The eGFR has been calculated using the CKD EPI equation.                          This calculation has not been validated in all clinical situations.  eGFR's persistently <90 mL/min signify possible Chronic Kidney                          Disease.  . WBC 03/01/2014 13.2* 4.0 - 10.5 K/uL Final  . RBC 03/01/2014 3.38* 3.87 - 5.11 MIL/uL Final  . Hemoglobin 03/01/2014 9.9* 12.0 - 15.0 g/dL Final  . HCT 03/01/2014 29.4* 36.0 - 46.0 % Final  . MCV 03/01/2014 87.0  78.0 - 100.0 fL Final  . MCH 03/01/2014 29.3  26.0 - 34.0 pg Final  . MCHC 03/01/2014 33.7  30.0 - 36.0 g/dL Final  . RDW 03/01/2014 13.0  11.5 - 15.5 % Final  . Platelets 03/01/2014 224  150 - 400 K/uL Final  . Sodium 03/01/2014 140  137 - 147 mEq/L Final  . Potassium 03/01/2014 3.8  3.7 - 5.3 mEq/L Final  . Chloride 03/01/2014 105  96 - 112 mEq/L Final  . CO2 03/01/2014 26  19 - 32 mEq/L Final  . Glucose, Bld 03/01/2014 110* 70 - 99 mg/dL Final  . BUN 03/01/2014 10  6 - 23 mg/dL Final  . Creatinine, Ser  03/01/2014 0.83  0.50 - 1.10 mg/dL Final  . Calcium 03/01/2014 8.6  8.4 - 10.5 mg/dL Final  . GFR calc non Af Amer 03/01/2014 65* >90 mL/min Final  . GFR calc Af Amer 03/01/2014 76* >90 mL/min Final   Comment: (NOTE)                          The eGFR has been calculated using the CKD EPI equation.                          This calculation has not been validated in all clinical situations.                          eGFR's persistently <90 mL/min signify possible Chronic Kidney                          Disease.  . WBC 03/02/2014 13.4* 4.0 - 10.5 K/uL Final  . RBC 03/02/2014 3.18* 3.87 - 5.11 MIL/uL Final  . Hemoglobin 03/02/2014 9.2* 12.0 - 15.0 g/dL Final  . HCT 03/02/2014 28.1* 36.0 - 46.0 % Final  . MCV 03/02/2014 88.4  78.0 - 100.0 fL Final  . MCH 03/02/2014 28.9  26.0 - 34.0 pg Final  . MCHC 03/02/2014 32.7  30.0 - 36.0 g/dL Final  . RDW 03/02/2014 13.1  11.5 - 15.5 % Final  . Platelets 03/02/2014 208  150 - 400 K/uL Final  . Hemoglobin 03/01/2014 9.8* 12.0 - 15.0 g/dL Final  . HCT 03/01/2014 28.9* 36.0 - 46.0 % Final  Hospital Outpatient Visit on 02/21/2014  Component Date Value Ref Range Status  . aPTT 02/21/2014 32  24 - 37 seconds Final  . Sodium 02/21/2014 141  137 - 147 mEq/L Final  . Potassium 02/21/2014 4.7  3.7 - 5.3 mEq/L Final  . Chloride 02/21/2014 103  96 - 112 mEq/L Final  . CO2 02/21/2014 26  19 - 32 mEq/L Final  . Glucose, Bld 02/21/2014 93  70 - 99 mg/dL Final  . BUN 02/21/2014 14  6 - 23 mg/dL Final  . Creatinine, Ser 02/21/2014 0.79  0.50 - 1.10 mg/dL Final  . Calcium 02/21/2014 9.6  8.4 -  10.5 mg/dL Final  . Total Protein 02/21/2014 7.5  6.0 - 8.3 g/dL Final  . Albumin 02/21/2014 3.7  3.5 - 5.2 g/dL Final  . AST 02/21/2014 26  0 - 37 U/L Final  . ALT 02/21/2014 20  0 - 35 U/L Final  . Alkaline Phosphatase 02/21/2014 67  39 - 117 U/L Final  . Total Bilirubin 02/21/2014 0.3  0.3 - 1.2 mg/dL Final  . GFR calc non Af Amer 02/21/2014 77* >90 mL/min Final  . GFR  calc Af Amer 02/21/2014 89* >90 mL/min Final   Comment: (NOTE)                          The eGFR has been calculated using the CKD EPI equation.                          This calculation has not been validated in all clinical situations.                          eGFR's persistently <90 mL/min signify possible Chronic Kidney                          Disease.  Marland Kitchen Prothrombin Time 02/21/2014 13.7  11.6 - 15.2 seconds Final  . INR 02/21/2014 1.07  0.00 - 1.49 Final  . ABO/RH(D) 02/21/2014 O NEG   Final  . Antibody Screen 02/21/2014 NEG   Final  . Sample Expiration 02/21/2014 03/02/2014   Final  . Color, Urine 02/21/2014 YELLOW  YELLOW Final  . APPearance 02/21/2014 CLEAR  CLEAR Final  . Specific Gravity, Urine 02/21/2014 1.015  1.005 - 1.030 Final  . pH 02/21/2014 6.0  5.0 - 8.0 Final  . Glucose, UA 02/21/2014 NEGATIVE  NEGATIVE mg/dL Final  . Hgb urine dipstick 02/21/2014 TRACE* NEGATIVE Final  . Bilirubin Urine 02/21/2014 NEGATIVE  NEGATIVE Final  . Ketones, ur 02/21/2014 NEGATIVE  NEGATIVE mg/dL Final  . Protein, ur 02/21/2014 NEGATIVE  NEGATIVE mg/dL Final  . Urobilinogen, UA 02/21/2014 0.2  0.0 - 1.0 mg/dL Final  . Nitrite 02/21/2014 NEGATIVE  NEGATIVE Final  . Leukocytes, UA 02/21/2014 NEGATIVE  NEGATIVE Final  . WBC 02/21/2014 8.3  4.0 - 10.5 K/uL Final  . RBC 02/21/2014 4.54  3.87 - 5.11 MIL/uL Final  . Hemoglobin 02/21/2014 13.3  12.0 - 15.0 g/dL Final  . HCT 02/21/2014 40.1  36.0 - 46.0 % Final  . MCV 02/21/2014 88.3  78.0 - 100.0 fL Final  . MCH 02/21/2014 29.3  26.0 - 34.0 pg Final  . MCHC 02/21/2014 33.2  30.0 - 36.0 g/dL Final  . RDW 02/21/2014 13.0  11.5 - 15.5 % Final  . Platelets 02/21/2014 282  150 - 400 K/uL Final  . MRSA, PCR 02/21/2014 NEGATIVE  NEGATIVE Final  . Staphylococcus aureus 02/21/2014 NEGATIVE  NEGATIVE Final   Comment:                                 The Xpert SA Assay (FDA                          approved for NASAL specimens  in patients over 106 years of age),                          is one component of                          a comprehensive surveillance                          program.  Test performance has                          been validated by [employer] for patients greater                          than or equal to 67 year old.                          It is not intended                          to diagnose infection nor to                          guide or monitor treatment.  . ABO/RH(D) 02/21/2014 O NEG   Final  . Squamous Epithelial / LPF 02/21/2014 RARE  RARE Final  . WBC, UA 02/21/2014 0-2  <3 WBC/hpf Final  . RBC / HPF 02/21/2014 3-6  <3 RBC/hpf Final  . Bacteria, UA 02/21/2014 RARE  RARE Final  . Casts 02/21/2014 HYALINE CASTS* NEGATIVE Final     X-Rays:Dg Knee Right Port  02/27/2014   CLINICAL DATA:  Status post right knee replacement.  EXAM: PORTABLE RIGHT KNEE - 1-2 VIEW  COMPARISON:  None.  FINDINGS: The patient has a right total knee arthroplasty. Surgical drain and gas in the soft tissues are noted. There is no fracture. Hardware is intact.  IMPRESSION: Right total knee replacement.  No acute abnormality.   Electronically Signed   By: Inge Rise M.D.   On: 02/27/2014 16:24    EKG:No orders found for this or any previous visit.   Hospital Course: Jaime [name] is a 80 y.o. who was admitted to [facility]. They were brought to the operating room on 02/27/2014 and underwent Procedure(s): RIGHT TOTAL KNEE ARTHROPLASTY.  Patient tolerated the procedure well and was later transferred to the recovery room and then to the orthopaedic floor for postoperative care.  They were given PO and IV analgesics for pain control following their surgery.  They were given 24 hours of postoperative antibiotics of  Anti-infectives   Start     Dose/Rate Route Frequency Ordered Stop   02/27/14 2000  ceFAZolin (ANCEF) IVPB 1 g/50 mL premix     1 g 100 mL/hr over 30 Minutes  Intravenous Every 6 hours 02/27/14 1747 02/28/14 0219   02/27/14 1418  polymyxin B 500,000 Units, bacitracin 50,000 Units in sodium chloride irrigation 0.9 % 500 mL irrigation  Status:  Discontinued       As needed 02/27/14 1418 02/27/14 1547   02/27/14 1147  ceFAZolin (ANCEF) IVPB 2  g/50 mL premix     2 g 100 mL/hr over 30 Minutes Intravenous On call to O.R. 02/27/14 1147 02/27/14 1335     and started on DVT prophylaxis in the form of Xarelto.   PT and OT were ordered for total joint protocol.  Discharge planning consulted to help with postop disposition and equipment needs.  Patient had a good night on the evening of surgery.  They started to get up OOB with therapy on day one. Hemovac drain was pulled without difficulty.  Continued to work with therapy into day two.  Dressing was changed on day two and the incision was clean and dyr.  By day three, the patient had progressed with therapy and meeting their goals. Unfortunately, she was having issues with orthostatic hypotension.  Hgb was holding steady at 9.2 and I&Os were good. She was monitored until post op day four. Patient was seen in rounds and was ready to go home.   Discharge Medications: Prior to Admission medications   Medication Sig Start Date End Date Taking? Authorizing Provider  bimatoprost (LUMIGAN) 0.01 % SOLN Place 1 drop into both eyes at bedtime.   Yes Historical Provider, MD  dorzolamide-timolol (COSOPT) 22.3-6.8 MG/ML ophthalmic solution Place 1 drop into both eyes 2 (two) times daily.   Yes Historical Provider, MD  fexofenadine (ALLEGRA) 180 MG tablet Take 180 mg by mouth daily.   Yes Historical Provider, MD  fluticasone (FLONASE) 50 MCG/ACT nasal spray Place 1 spray into both nostrils daily as needed for allergies or rhinitis.   Yes Historical Provider, MD  hyoscyamine (ANASPAZ) 0.125 MG TBDP disintergrating tablet Place 0.125 mg under the tongue every 4 (four) hours as needed for cramping.   Yes Historical Provider, MD    rosuvastatin (CRESTOR) 10 MG tablet Take 5 mg by mouth daily.   Yes Historical Provider, MD  ferrous sulfate 325 (65 FE) MG tablet Take 1 tablet (325 mg total) by mouth 3 (three) times daily after meals. 02/28/14   Drayk Humbarger Renelda Loma, PA-C  methocarbamol (ROBAXIN) 500 MG tablet Take 1 tablet (500 mg total) by mouth every 6 (six) hours as needed for muscle spasms. 02/28/14   Shaley Leavens Renelda Loma, PA-C  oxyCODONE-acetaminophen (PERCOCET/ROXICET) 5-325 MG per tablet Take 1-2 tablets by mouth every 4 (four) hours as needed for severe pain. 02/28/14   Artavis Cowie Renelda Loma, PA-C  rivaroxaban (XARELTO) 10 MG TABS tablet Take 1 tablet (10 mg total) by mouth daily with breakfast. 02/28/14   Tatisha Cerino Renelda Loma, PA-C    Diet: Regular diet Activity:WBAT Follow-up:in 2 weeks Disposition - Home Discharged Condition: stable   Discharge Orders   Future Orders Complete By Expires   Call MD / Call 911  As directed    Constipation Prevention  As directed    Diet general  As directed    Discharge instructions  As directed    Do not put a pillow under the knee. Place it under the heel.  As directed    Driving restrictions  As directed    Increase activity slowly as tolerated  As directed        Medication List    STOP taking these medications       ALEVE 220 MG tablet  Generic drug:  naproxen sodium     aspirin 325 MG tablet     CALCIUM 600 + D PO     cholecalciferol 400 UNITS Tabs tablet  Commonly known as:  VITAMIN D     multivitamin with  minerals Tabs tablet     raloxifene 60 MG tablet  Commonly known as:  EVISTA      TAKE these medications       bimatoprost 0.01 % Soln  Commonly known as:  LUMIGAN  Place 1 drop into both eyes at bedtime.     dorzolamide-timolol 22.3-6.8 MG/ML ophthalmic solution  Commonly known as:  COSOPT  Place 1 drop into both eyes 2 (two) times daily.     ferrous sulfate 325 (65 FE) MG tablet  Take 1 tablet (325 mg total) by mouth 3 (three) times  daily after meals.     fexofenadine 180 MG tablet  Commonly known as:  ALLEGRA  Take 180 mg by mouth daily.     fluticasone 50 MCG/ACT nasal spray  Commonly known as:  FLONASE  Place 1 spray into both nostrils daily as needed for allergies or rhinitis.     hyoscyamine 0.125 MG Tbdp disintergrating tablet  Commonly known as:  ANASPAZ  Place 0.125 mg under the tongue every 4 (four) hours as needed for cramping.     methocarbamol 500 MG tablet  Commonly known as:  ROBAXIN  Take 1 tablet (500 mg total) by mouth every 6 (six) hours as needed for muscle spasms.     oxyCODONE-acetaminophen 5-325 MG per tablet  Commonly known as:  PERCOCET/ROXICET  Take 1-2 tablets by mouth every 4 (four) hours as needed for severe pain.     rivaroxaban 10 MG Tabs tablet  Commonly known as:  XARELTO  Take 1 tablet (10 mg total) by mouth daily with breakfast.     rosuvastatin 10 MG tablet  Commonly known as:  CRESTOR  Take 5 mg by mouth daily.           Follow-up Information   Follow up with GIOFFRE,RONALD A, MD. Schedule an appointment as soon as possible for a visit in 2 weeks.   Specialty:  Orthopedic Surgery   Contact information:   [address] [place] [address] [phone]       Follow up with [facility])    Contact information:   [address] [place] [address] [phone]       Signed: Malachy Chamber 03/04/2014, 8:58 AM

## 2014-03-05 DIAGNOSIS — Z7901 Long term (current) use of anticoagulants: Secondary | ICD-10-CM | POA: Diagnosis not present

## 2014-03-05 DIAGNOSIS — Z96659 Presence of unspecified artificial knee joint: Secondary | ICD-10-CM | POA: Diagnosis not present

## 2014-03-05 DIAGNOSIS — IMO0001 Reserved for inherently not codable concepts without codable children: Secondary | ICD-10-CM | POA: Diagnosis not present

## 2014-03-05 DIAGNOSIS — Z471 Aftercare following joint replacement surgery: Secondary | ICD-10-CM | POA: Diagnosis not present

## 2014-03-06 DIAGNOSIS — Z7901 Long term (current) use of anticoagulants: Secondary | ICD-10-CM | POA: Diagnosis not present

## 2014-03-06 DIAGNOSIS — IMO0001 Reserved for inherently not codable concepts without codable children: Secondary | ICD-10-CM | POA: Diagnosis not present

## 2014-03-06 DIAGNOSIS — Z471 Aftercare following joint replacement surgery: Secondary | ICD-10-CM | POA: Diagnosis not present

## 2014-03-06 DIAGNOSIS — Z96659 Presence of unspecified artificial knee joint: Secondary | ICD-10-CM | POA: Diagnosis not present

## 2014-03-07 DIAGNOSIS — IMO0001 Reserved for inherently not codable concepts without codable children: Secondary | ICD-10-CM | POA: Diagnosis not present

## 2014-03-07 DIAGNOSIS — Z7901 Long term (current) use of anticoagulants: Secondary | ICD-10-CM | POA: Diagnosis not present

## 2014-03-07 DIAGNOSIS — Z96659 Presence of unspecified artificial knee joint: Secondary | ICD-10-CM | POA: Diagnosis not present

## 2014-03-07 DIAGNOSIS — Z471 Aftercare following joint replacement surgery: Secondary | ICD-10-CM | POA: Diagnosis not present

## 2014-03-08 DIAGNOSIS — Z471 Aftercare following joint replacement surgery: Secondary | ICD-10-CM | POA: Diagnosis not present

## 2014-03-08 DIAGNOSIS — Z7901 Long term (current) use of anticoagulants: Secondary | ICD-10-CM | POA: Diagnosis not present

## 2014-03-08 DIAGNOSIS — Z96659 Presence of unspecified artificial knee joint: Secondary | ICD-10-CM | POA: Diagnosis not present

## 2014-03-08 DIAGNOSIS — IMO0001 Reserved for inherently not codable concepts without codable children: Secondary | ICD-10-CM | POA: Diagnosis not present

## 2014-03-11 DIAGNOSIS — Z96659 Presence of unspecified artificial knee joint: Secondary | ICD-10-CM | POA: Diagnosis not present

## 2014-03-11 DIAGNOSIS — Z471 Aftercare following joint replacement surgery: Secondary | ICD-10-CM | POA: Diagnosis not present

## 2014-03-11 DIAGNOSIS — Z7901 Long term (current) use of anticoagulants: Secondary | ICD-10-CM | POA: Diagnosis not present

## 2014-03-11 DIAGNOSIS — IMO0001 Reserved for inherently not codable concepts without codable children: Secondary | ICD-10-CM | POA: Diagnosis not present

## 2014-03-12 DIAGNOSIS — Z96659 Presence of unspecified artificial knee joint: Secondary | ICD-10-CM | POA: Diagnosis not present

## 2014-03-12 DIAGNOSIS — IMO0001 Reserved for inherently not codable concepts without codable children: Secondary | ICD-10-CM | POA: Diagnosis not present

## 2014-03-12 DIAGNOSIS — Z471 Aftercare following joint replacement surgery: Secondary | ICD-10-CM | POA: Diagnosis not present

## 2014-03-12 DIAGNOSIS — Z7901 Long term (current) use of anticoagulants: Secondary | ICD-10-CM | POA: Diagnosis not present

## 2014-03-13 DIAGNOSIS — Z7901 Long term (current) use of anticoagulants: Secondary | ICD-10-CM | POA: Diagnosis not present

## 2014-03-13 DIAGNOSIS — Z471 Aftercare following joint replacement surgery: Secondary | ICD-10-CM | POA: Diagnosis not present

## 2014-03-13 DIAGNOSIS — Z96659 Presence of unspecified artificial knee joint: Secondary | ICD-10-CM | POA: Diagnosis not present

## 2014-03-13 DIAGNOSIS — IMO0001 Reserved for inherently not codable concepts without codable children: Secondary | ICD-10-CM | POA: Diagnosis not present

## 2014-03-14 DIAGNOSIS — IMO0001 Reserved for inherently not codable concepts without codable children: Secondary | ICD-10-CM | POA: Diagnosis not present

## 2014-03-14 DIAGNOSIS — Z96659 Presence of unspecified artificial knee joint: Secondary | ICD-10-CM | POA: Diagnosis not present

## 2014-03-14 DIAGNOSIS — Z471 Aftercare following joint replacement surgery: Secondary | ICD-10-CM | POA: Diagnosis not present

## 2014-03-14 DIAGNOSIS — Z7901 Long term (current) use of anticoagulants: Secondary | ICD-10-CM | POA: Diagnosis not present

## 2014-03-15 DIAGNOSIS — Z471 Aftercare following joint replacement surgery: Secondary | ICD-10-CM | POA: Diagnosis not present

## 2014-03-15 DIAGNOSIS — Z96659 Presence of unspecified artificial knee joint: Secondary | ICD-10-CM | POA: Diagnosis not present

## 2014-03-15 DIAGNOSIS — Z7901 Long term (current) use of anticoagulants: Secondary | ICD-10-CM | POA: Diagnosis not present

## 2014-03-15 DIAGNOSIS — IMO0001 Reserved for inherently not codable concepts without codable children: Secondary | ICD-10-CM | POA: Diagnosis not present

## 2014-03-18 DIAGNOSIS — IMO0001 Reserved for inherently not codable concepts without codable children: Secondary | ICD-10-CM | POA: Diagnosis not present

## 2014-03-18 DIAGNOSIS — Z471 Aftercare following joint replacement surgery: Secondary | ICD-10-CM | POA: Diagnosis not present

## 2014-03-18 DIAGNOSIS — Z96659 Presence of unspecified artificial knee joint: Secondary | ICD-10-CM | POA: Diagnosis not present

## 2014-03-18 DIAGNOSIS — Z7901 Long term (current) use of anticoagulants: Secondary | ICD-10-CM | POA: Diagnosis not present

## 2014-03-19 DIAGNOSIS — Z96659 Presence of unspecified artificial knee joint: Secondary | ICD-10-CM | POA: Diagnosis not present

## 2014-03-19 DIAGNOSIS — Z7901 Long term (current) use of anticoagulants: Secondary | ICD-10-CM | POA: Diagnosis not present

## 2014-03-19 DIAGNOSIS — IMO0001 Reserved for inherently not codable concepts without codable children: Secondary | ICD-10-CM | POA: Diagnosis not present

## 2014-03-19 DIAGNOSIS — Z471 Aftercare following joint replacement surgery: Secondary | ICD-10-CM | POA: Diagnosis not present

## 2014-03-20 DIAGNOSIS — IMO0001 Reserved for inherently not codable concepts without codable children: Secondary | ICD-10-CM | POA: Diagnosis not present

## 2014-03-20 DIAGNOSIS — Z7901 Long term (current) use of anticoagulants: Secondary | ICD-10-CM | POA: Diagnosis not present

## 2014-03-20 DIAGNOSIS — Z471 Aftercare following joint replacement surgery: Secondary | ICD-10-CM | POA: Diagnosis not present

## 2014-03-20 DIAGNOSIS — Z96659 Presence of unspecified artificial knee joint: Secondary | ICD-10-CM | POA: Diagnosis not present

## 2014-03-21 DIAGNOSIS — Z7901 Long term (current) use of anticoagulants: Secondary | ICD-10-CM | POA: Diagnosis not present

## 2014-03-21 DIAGNOSIS — Z96659 Presence of unspecified artificial knee joint: Secondary | ICD-10-CM | POA: Diagnosis not present

## 2014-03-21 DIAGNOSIS — IMO0001 Reserved for inherently not codable concepts without codable children: Secondary | ICD-10-CM | POA: Diagnosis not present

## 2014-03-21 DIAGNOSIS — Z471 Aftercare following joint replacement surgery: Secondary | ICD-10-CM | POA: Diagnosis not present

## 2014-03-22 DIAGNOSIS — IMO0001 Reserved for inherently not codable concepts without codable children: Secondary | ICD-10-CM | POA: Diagnosis not present

## 2014-03-22 DIAGNOSIS — Z7901 Long term (current) use of anticoagulants: Secondary | ICD-10-CM | POA: Diagnosis not present

## 2014-03-22 DIAGNOSIS — Z96659 Presence of unspecified artificial knee joint: Secondary | ICD-10-CM | POA: Diagnosis not present

## 2014-03-22 DIAGNOSIS — Z471 Aftercare following joint replacement surgery: Secondary | ICD-10-CM | POA: Diagnosis not present

## 2014-03-26 ENCOUNTER — Encounter: Payer: Self-pay | Admitting: Orthopedic Surgery

## 2014-03-26 DIAGNOSIS — IMO0001 Reserved for inherently not codable concepts without codable children: Secondary | ICD-10-CM | POA: Diagnosis not present

## 2014-03-26 DIAGNOSIS — R262 Difficulty in walking, not elsewhere classified: Secondary | ICD-10-CM | POA: Diagnosis not present

## 2014-03-26 DIAGNOSIS — M2569 Stiffness of other specified joint, not elsewhere classified: Secondary | ICD-10-CM | POA: Diagnosis not present

## 2014-03-28 DIAGNOSIS — Z96659 Presence of unspecified artificial knee joint: Secondary | ICD-10-CM | POA: Diagnosis not present

## 2014-03-29 DIAGNOSIS — H4010X0 Unspecified open-angle glaucoma, stage unspecified: Secondary | ICD-10-CM | POA: Diagnosis not present

## 2014-04-22 ENCOUNTER — Encounter: Payer: Self-pay | Admitting: Orthopedic Surgery

## 2014-04-22 DIAGNOSIS — IMO0001 Reserved for inherently not codable concepts without codable children: Secondary | ICD-10-CM | POA: Diagnosis not present

## 2014-04-22 DIAGNOSIS — M25669 Stiffness of unspecified knee, not elsewhere classified: Secondary | ICD-10-CM | POA: Diagnosis not present

## 2014-04-22 DIAGNOSIS — Z96659 Presence of unspecified artificial knee joint: Secondary | ICD-10-CM | POA: Diagnosis not present

## 2014-04-22 DIAGNOSIS — R262 Difficulty in walking, not elsewhere classified: Secondary | ICD-10-CM | POA: Diagnosis not present

## 2014-05-10 DIAGNOSIS — Z96659 Presence of unspecified artificial knee joint: Secondary | ICD-10-CM | POA: Diagnosis not present

## 2014-05-22 ENCOUNTER — Encounter: Payer: Self-pay | Admitting: Orthopedic Surgery

## 2014-05-22 DIAGNOSIS — IMO0001 Reserved for inherently not codable concepts without codable children: Secondary | ICD-10-CM | POA: Diagnosis not present

## 2014-05-22 DIAGNOSIS — Z96659 Presence of unspecified artificial knee joint: Secondary | ICD-10-CM | POA: Diagnosis not present

## 2014-05-22 DIAGNOSIS — R262 Difficulty in walking, not elsewhere classified: Secondary | ICD-10-CM | POA: Diagnosis not present

## 2014-05-22 DIAGNOSIS — M25669 Stiffness of unspecified knee, not elsewhere classified: Secondary | ICD-10-CM | POA: Diagnosis not present

## 2014-06-12 DIAGNOSIS — Z471 Aftercare following joint replacement surgery: Secondary | ICD-10-CM | POA: Diagnosis not present

## 2014-06-12 DIAGNOSIS — Z96659 Presence of unspecified artificial knee joint: Secondary | ICD-10-CM | POA: Diagnosis not present

## 2014-06-22 ENCOUNTER — Encounter: Payer: Self-pay | Admitting: Orthopedic Surgery

## 2014-08-13 DIAGNOSIS — Z23 Encounter for immunization: Secondary | ICD-10-CM | POA: Diagnosis not present

## 2014-09-27 DIAGNOSIS — H4011X2 Primary open-angle glaucoma, moderate stage: Secondary | ICD-10-CM | POA: Diagnosis not present

## 2014-12-09 DIAGNOSIS — M25562 Pain in left knee: Secondary | ICD-10-CM | POA: Diagnosis not present

## 2014-12-09 DIAGNOSIS — Z471 Aftercare following joint replacement surgery: Secondary | ICD-10-CM | POA: Diagnosis not present

## 2014-12-09 DIAGNOSIS — Z96651 Presence of right artificial knee joint: Secondary | ICD-10-CM | POA: Diagnosis not present

## 2015-01-01 DIAGNOSIS — E785 Hyperlipidemia, unspecified: Secondary | ICD-10-CM | POA: Diagnosis not present

## 2015-01-01 DIAGNOSIS — Z008 Encounter for other general examination: Secondary | ICD-10-CM | POA: Diagnosis not present

## 2015-01-01 DIAGNOSIS — M859 Disorder of bone density and structure, unspecified: Secondary | ICD-10-CM | POA: Diagnosis not present

## 2015-01-08 DIAGNOSIS — D860 Sarcoidosis of lung: Secondary | ICD-10-CM | POA: Diagnosis not present

## 2015-01-08 DIAGNOSIS — F329 Major depressive disorder, single episode, unspecified: Secondary | ICD-10-CM | POA: Diagnosis not present

## 2015-01-08 DIAGNOSIS — J302 Other seasonal allergic rhinitis: Secondary | ICD-10-CM | POA: Diagnosis not present

## 2015-01-08 DIAGNOSIS — Z6835 Body mass index (BMI) 35.0-35.9, adult: Secondary | ICD-10-CM | POA: Diagnosis not present

## 2015-01-08 DIAGNOSIS — Z1389 Encounter for screening for other disorder: Secondary | ICD-10-CM | POA: Diagnosis not present

## 2015-01-08 DIAGNOSIS — E669 Obesity, unspecified: Secondary | ICD-10-CM | POA: Diagnosis not present

## 2015-01-08 DIAGNOSIS — R312 Other microscopic hematuria: Secondary | ICD-10-CM | POA: Diagnosis not present

## 2015-01-08 DIAGNOSIS — R030 Elevated blood-pressure reading, without diagnosis of hypertension: Secondary | ICD-10-CM | POA: Diagnosis not present

## 2015-01-08 DIAGNOSIS — N318 Other neuromuscular dysfunction of bladder: Secondary | ICD-10-CM | POA: Diagnosis not present

## 2015-01-08 DIAGNOSIS — H409 Unspecified glaucoma: Secondary | ICD-10-CM | POA: Diagnosis not present

## 2015-01-08 DIAGNOSIS — K589 Irritable bowel syndrome without diarrhea: Secondary | ICD-10-CM | POA: Diagnosis not present

## 2015-01-08 DIAGNOSIS — E785 Hyperlipidemia, unspecified: Secondary | ICD-10-CM | POA: Diagnosis not present

## 2015-01-09 DIAGNOSIS — Z1212 Encounter for screening for malignant neoplasm of rectum: Secondary | ICD-10-CM | POA: Diagnosis not present

## 2015-01-27 DIAGNOSIS — H4011X2 Primary open-angle glaucoma, moderate stage: Secondary | ICD-10-CM | POA: Diagnosis not present

## 2015-06-02 DIAGNOSIS — R197 Diarrhea, unspecified: Secondary | ICD-10-CM | POA: Diagnosis not present

## 2015-06-02 DIAGNOSIS — K589 Irritable bowel syndrome without diarrhea: Secondary | ICD-10-CM | POA: Diagnosis not present

## 2015-06-02 DIAGNOSIS — Z6834 Body mass index (BMI) 34.0-34.9, adult: Secondary | ICD-10-CM | POA: Diagnosis not present

## 2015-06-16 DIAGNOSIS — Z96651 Presence of right artificial knee joint: Secondary | ICD-10-CM | POA: Diagnosis not present

## 2015-06-16 DIAGNOSIS — Z471 Aftercare following joint replacement surgery: Secondary | ICD-10-CM | POA: Diagnosis not present

## 2015-06-16 DIAGNOSIS — M1712 Unilateral primary osteoarthritis, left knee: Secondary | ICD-10-CM | POA: Diagnosis not present

## 2015-07-29 DIAGNOSIS — H4011X2 Primary open-angle glaucoma, moderate stage: Secondary | ICD-10-CM | POA: Diagnosis not present

## 2015-08-05 DIAGNOSIS — H4011X2 Primary open-angle glaucoma, moderate stage: Secondary | ICD-10-CM | POA: Diagnosis not present

## 2015-08-23 DIAGNOSIS — Z23 Encounter for immunization: Secondary | ICD-10-CM | POA: Diagnosis not present

## 2015-08-28 DIAGNOSIS — M25562 Pain in left knee: Secondary | ICD-10-CM | POA: Diagnosis not present

## 2015-08-28 DIAGNOSIS — M1712 Unilateral primary osteoarthritis, left knee: Secondary | ICD-10-CM | POA: Diagnosis not present

## 2015-09-01 DIAGNOSIS — M25562 Pain in left knee: Secondary | ICD-10-CM | POA: Diagnosis not present

## 2015-09-01 DIAGNOSIS — M1712 Unilateral primary osteoarthritis, left knee: Secondary | ICD-10-CM | POA: Diagnosis not present

## 2015-09-01 DIAGNOSIS — R269 Unspecified abnormalities of gait and mobility: Secondary | ICD-10-CM | POA: Diagnosis not present

## 2015-09-09 DIAGNOSIS — M1712 Unilateral primary osteoarthritis, left knee: Secondary | ICD-10-CM | POA: Diagnosis not present

## 2015-09-09 DIAGNOSIS — M25562 Pain in left knee: Secondary | ICD-10-CM | POA: Diagnosis not present

## 2015-09-09 DIAGNOSIS — R269 Unspecified abnormalities of gait and mobility: Secondary | ICD-10-CM | POA: Diagnosis not present

## 2015-09-18 DIAGNOSIS — M25562 Pain in left knee: Secondary | ICD-10-CM | POA: Diagnosis not present

## 2015-09-18 DIAGNOSIS — M1712 Unilateral primary osteoarthritis, left knee: Secondary | ICD-10-CM | POA: Diagnosis not present

## 2015-09-18 DIAGNOSIS — R2689 Other abnormalities of gait and mobility: Secondary | ICD-10-CM | POA: Diagnosis not present

## 2015-09-24 DIAGNOSIS — R262 Difficulty in walking, not elsewhere classified: Secondary | ICD-10-CM | POA: Diagnosis not present

## 2015-09-24 DIAGNOSIS — M25562 Pain in left knee: Secondary | ICD-10-CM | POA: Diagnosis not present

## 2015-09-24 DIAGNOSIS — M1712 Unilateral primary osteoarthritis, left knee: Secondary | ICD-10-CM | POA: Diagnosis not present

## 2015-10-29 DIAGNOSIS — M859 Disorder of bone density and structure, unspecified: Secondary | ICD-10-CM | POA: Diagnosis not present

## 2016-01-12 DIAGNOSIS — R262 Difficulty in walking, not elsewhere classified: Secondary | ICD-10-CM | POA: Diagnosis not present

## 2016-01-12 DIAGNOSIS — M25562 Pain in left knee: Secondary | ICD-10-CM | POA: Diagnosis not present

## 2016-01-12 DIAGNOSIS — M1712 Unilateral primary osteoarthritis, left knee: Secondary | ICD-10-CM | POA: Diagnosis not present

## 2016-01-12 DIAGNOSIS — Z96651 Presence of right artificial knee joint: Secondary | ICD-10-CM | POA: Diagnosis not present

## 2016-02-03 DIAGNOSIS — H401132 Primary open-angle glaucoma, bilateral, moderate stage: Secondary | ICD-10-CM | POA: Diagnosis not present

## 2016-02-18 DIAGNOSIS — E784 Other hyperlipidemia: Secondary | ICD-10-CM | POA: Diagnosis not present

## 2016-02-18 DIAGNOSIS — M859 Disorder of bone density and structure, unspecified: Secondary | ICD-10-CM | POA: Diagnosis not present

## 2016-02-24 DIAGNOSIS — Z1231 Encounter for screening mammogram for malignant neoplasm of breast: Secondary | ICD-10-CM | POA: Diagnosis not present

## 2016-02-24 DIAGNOSIS — H4089 Other specified glaucoma: Secondary | ICD-10-CM | POA: Diagnosis not present

## 2016-02-24 DIAGNOSIS — N318 Other neuromuscular dysfunction of bladder: Secondary | ICD-10-CM | POA: Diagnosis not present

## 2016-02-24 DIAGNOSIS — R3129 Other microscopic hematuria: Secondary | ICD-10-CM | POA: Diagnosis not present

## 2016-02-24 DIAGNOSIS — D860 Sarcoidosis of lung: Secondary | ICD-10-CM | POA: Diagnosis not present

## 2016-02-24 DIAGNOSIS — R030 Elevated blood-pressure reading, without diagnosis of hypertension: Secondary | ICD-10-CM | POA: Diagnosis not present

## 2016-02-24 DIAGNOSIS — Z1389 Encounter for screening for other disorder: Secondary | ICD-10-CM | POA: Diagnosis not present

## 2016-02-24 DIAGNOSIS — K589 Irritable bowel syndrome without diarrhea: Secondary | ICD-10-CM | POA: Diagnosis not present

## 2016-02-24 DIAGNOSIS — Z6834 Body mass index (BMI) 34.0-34.9, adult: Secondary | ICD-10-CM | POA: Diagnosis not present

## 2016-02-24 DIAGNOSIS — M859 Disorder of bone density and structure, unspecified: Secondary | ICD-10-CM | POA: Diagnosis not present

## 2016-02-24 DIAGNOSIS — Z0000 Encounter for general adult medical examination without abnormal findings: Secondary | ICD-10-CM | POA: Diagnosis not present

## 2016-02-24 DIAGNOSIS — E784 Other hyperlipidemia: Secondary | ICD-10-CM | POA: Diagnosis not present

## 2016-03-22 DIAGNOSIS — M1712 Unilateral primary osteoarthritis, left knee: Secondary | ICD-10-CM | POA: Diagnosis not present

## 2016-03-22 DIAGNOSIS — R262 Difficulty in walking, not elsewhere classified: Secondary | ICD-10-CM | POA: Diagnosis not present

## 2016-03-22 DIAGNOSIS — M25562 Pain in left knee: Secondary | ICD-10-CM | POA: Diagnosis not present

## 2016-03-29 DIAGNOSIS — M1712 Unilateral primary osteoarthritis, left knee: Secondary | ICD-10-CM | POA: Diagnosis not present

## 2016-03-29 DIAGNOSIS — M25562 Pain in left knee: Secondary | ICD-10-CM | POA: Diagnosis not present

## 2016-04-05 DIAGNOSIS — M1712 Unilateral primary osteoarthritis, left knee: Secondary | ICD-10-CM | POA: Diagnosis not present

## 2016-04-05 DIAGNOSIS — M25562 Pain in left knee: Secondary | ICD-10-CM | POA: Diagnosis not present

## 2016-04-13 DIAGNOSIS — M25562 Pain in left knee: Secondary | ICD-10-CM | POA: Diagnosis not present

## 2016-04-13 DIAGNOSIS — M1712 Unilateral primary osteoarthritis, left knee: Secondary | ICD-10-CM | POA: Diagnosis not present

## 2016-04-20 DIAGNOSIS — D860 Sarcoidosis of lung: Secondary | ICD-10-CM | POA: Diagnosis not present

## 2016-04-21 ENCOUNTER — Other Ambulatory Visit: Payer: Self-pay | Admitting: Internal Medicine

## 2016-04-21 DIAGNOSIS — M1712 Unilateral primary osteoarthritis, left knee: Secondary | ICD-10-CM | POA: Diagnosis not present

## 2016-04-21 DIAGNOSIS — M25562 Pain in left knee: Secondary | ICD-10-CM | POA: Diagnosis not present

## 2016-04-21 DIAGNOSIS — J90 Pleural effusion, not elsewhere classified: Secondary | ICD-10-CM

## 2016-04-23 ENCOUNTER — Ambulatory Visit
Admission: RE | Admit: 2016-04-23 | Discharge: 2016-04-23 | Disposition: A | Discharge status: Home / Self Care | Payer: Medicare Other | Source: Ambulatory Visit | Attending: Internal Medicine | Admitting: Internal Medicine

## 2016-04-23 DIAGNOSIS — J90 Pleural effusion, not elsewhere classified: Secondary | ICD-10-CM

## 2016-04-23 MED ORDER — IOPAMIDOL (ISOVUE-300) INJECTION 61%
75.0000 mL | Freq: Once | INTRAVENOUS | Status: AC | PRN
  Administered 2016-04-23: 75 mL via INTRAVENOUS

## 2016-08-03 DIAGNOSIS — H401132 Primary open-angle glaucoma, bilateral, moderate stage: Secondary | ICD-10-CM | POA: Diagnosis not present

## 2016-08-09 DIAGNOSIS — H401132 Primary open-angle glaucoma, bilateral, moderate stage: Secondary | ICD-10-CM | POA: Diagnosis not present

## 2016-11-19 DIAGNOSIS — R358 Other polyuria: Secondary | ICD-10-CM | POA: Diagnosis not present

## 2016-11-19 DIAGNOSIS — R300 Dysuria: Secondary | ICD-10-CM | POA: Diagnosis not present

## 2016-11-26 DIAGNOSIS — D860 Sarcoidosis of lung: Secondary | ICD-10-CM | POA: Diagnosis not present

## 2016-11-30 DIAGNOSIS — M6281 Muscle weakness (generalized): Secondary | ICD-10-CM | POA: Diagnosis not present

## 2016-11-30 DIAGNOSIS — M25562 Pain in left knee: Secondary | ICD-10-CM | POA: Diagnosis not present

## 2016-11-30 DIAGNOSIS — R262 Difficulty in walking, not elsewhere classified: Secondary | ICD-10-CM | POA: Diagnosis not present

## 2016-11-30 DIAGNOSIS — R269 Unspecified abnormalities of gait and mobility: Secondary | ICD-10-CM | POA: Diagnosis not present

## 2016-12-02 DIAGNOSIS — M6281 Muscle weakness (generalized): Secondary | ICD-10-CM | POA: Diagnosis not present

## 2016-12-02 DIAGNOSIS — R269 Unspecified abnormalities of gait and mobility: Secondary | ICD-10-CM | POA: Diagnosis not present

## 2016-12-02 DIAGNOSIS — M25562 Pain in left knee: Secondary | ICD-10-CM | POA: Diagnosis not present

## 2016-12-02 DIAGNOSIS — R262 Difficulty in walking, not elsewhere classified: Secondary | ICD-10-CM | POA: Diagnosis not present

## 2016-12-06 DIAGNOSIS — R262 Difficulty in walking, not elsewhere classified: Secondary | ICD-10-CM | POA: Diagnosis not present

## 2016-12-06 DIAGNOSIS — M6281 Muscle weakness (generalized): Secondary | ICD-10-CM | POA: Diagnosis not present

## 2016-12-06 DIAGNOSIS — R269 Unspecified abnormalities of gait and mobility: Secondary | ICD-10-CM | POA: Diagnosis not present

## 2016-12-06 DIAGNOSIS — M25562 Pain in left knee: Secondary | ICD-10-CM | POA: Diagnosis not present

## 2016-12-13 DIAGNOSIS — M6281 Muscle weakness (generalized): Secondary | ICD-10-CM | POA: Diagnosis not present

## 2016-12-13 DIAGNOSIS — R262 Difficulty in walking, not elsewhere classified: Secondary | ICD-10-CM | POA: Diagnosis not present

## 2016-12-13 DIAGNOSIS — R269 Unspecified abnormalities of gait and mobility: Secondary | ICD-10-CM | POA: Diagnosis not present

## 2016-12-13 DIAGNOSIS — M25562 Pain in left knee: Secondary | ICD-10-CM | POA: Diagnosis not present

## 2016-12-16 DIAGNOSIS — M25562 Pain in left knee: Secondary | ICD-10-CM | POA: Diagnosis not present

## 2016-12-16 DIAGNOSIS — R269 Unspecified abnormalities of gait and mobility: Secondary | ICD-10-CM | POA: Diagnosis not present

## 2016-12-16 DIAGNOSIS — R262 Difficulty in walking, not elsewhere classified: Secondary | ICD-10-CM | POA: Diagnosis not present

## 2016-12-16 DIAGNOSIS — M6281 Muscle weakness (generalized): Secondary | ICD-10-CM | POA: Diagnosis not present

## 2016-12-21 DIAGNOSIS — M6281 Muscle weakness (generalized): Secondary | ICD-10-CM | POA: Diagnosis not present

## 2016-12-21 DIAGNOSIS — R269 Unspecified abnormalities of gait and mobility: Secondary | ICD-10-CM | POA: Diagnosis not present

## 2016-12-21 DIAGNOSIS — M25562 Pain in left knee: Secondary | ICD-10-CM | POA: Diagnosis not present

## 2016-12-21 DIAGNOSIS — R262 Difficulty in walking, not elsewhere classified: Secondary | ICD-10-CM | POA: Diagnosis not present

## 2016-12-28 DIAGNOSIS — M25562 Pain in left knee: Secondary | ICD-10-CM | POA: Diagnosis not present

## 2016-12-28 DIAGNOSIS — R269 Unspecified abnormalities of gait and mobility: Secondary | ICD-10-CM | POA: Diagnosis not present

## 2016-12-28 DIAGNOSIS — M6281 Muscle weakness (generalized): Secondary | ICD-10-CM | POA: Diagnosis not present

## 2016-12-28 DIAGNOSIS — R262 Difficulty in walking, not elsewhere classified: Secondary | ICD-10-CM | POA: Diagnosis not present

## 2016-12-30 DIAGNOSIS — M25562 Pain in left knee: Secondary | ICD-10-CM | POA: Diagnosis not present

## 2016-12-30 DIAGNOSIS — R262 Difficulty in walking, not elsewhere classified: Secondary | ICD-10-CM | POA: Diagnosis not present

## 2016-12-30 DIAGNOSIS — R269 Unspecified abnormalities of gait and mobility: Secondary | ICD-10-CM | POA: Diagnosis not present

## 2016-12-30 DIAGNOSIS — M6281 Muscle weakness (generalized): Secondary | ICD-10-CM | POA: Diagnosis not present

## 2017-01-04 DIAGNOSIS — M25562 Pain in left knee: Secondary | ICD-10-CM | POA: Diagnosis not present

## 2017-01-04 DIAGNOSIS — R269 Unspecified abnormalities of gait and mobility: Secondary | ICD-10-CM | POA: Diagnosis not present

## 2017-01-04 DIAGNOSIS — M6281 Muscle weakness (generalized): Secondary | ICD-10-CM | POA: Diagnosis not present

## 2017-01-04 DIAGNOSIS — R262 Difficulty in walking, not elsewhere classified: Secondary | ICD-10-CM | POA: Diagnosis not present

## 2017-01-11 DIAGNOSIS — R269 Unspecified abnormalities of gait and mobility: Secondary | ICD-10-CM | POA: Diagnosis not present

## 2017-01-11 DIAGNOSIS — M25562 Pain in left knee: Secondary | ICD-10-CM | POA: Diagnosis not present

## 2017-01-11 DIAGNOSIS — R262 Difficulty in walking, not elsewhere classified: Secondary | ICD-10-CM | POA: Diagnosis not present

## 2017-01-11 DIAGNOSIS — M6281 Muscle weakness (generalized): Secondary | ICD-10-CM | POA: Diagnosis not present

## 2017-01-18 DIAGNOSIS — R262 Difficulty in walking, not elsewhere classified: Secondary | ICD-10-CM | POA: Diagnosis not present

## 2017-01-18 DIAGNOSIS — M25562 Pain in left knee: Secondary | ICD-10-CM | POA: Diagnosis not present

## 2017-01-18 DIAGNOSIS — R269 Unspecified abnormalities of gait and mobility: Secondary | ICD-10-CM | POA: Diagnosis not present

## 2017-01-18 DIAGNOSIS — M6281 Muscle weakness (generalized): Secondary | ICD-10-CM | POA: Diagnosis not present

## 2017-01-20 DIAGNOSIS — R269 Unspecified abnormalities of gait and mobility: Secondary | ICD-10-CM | POA: Diagnosis not present

## 2017-01-20 DIAGNOSIS — R262 Difficulty in walking, not elsewhere classified: Secondary | ICD-10-CM | POA: Diagnosis not present

## 2017-01-20 DIAGNOSIS — M25562 Pain in left knee: Secondary | ICD-10-CM | POA: Diagnosis not present

## 2017-01-20 DIAGNOSIS — M6281 Muscle weakness (generalized): Secondary | ICD-10-CM | POA: Diagnosis not present

## 2017-01-25 DIAGNOSIS — M6281 Muscle weakness (generalized): Secondary | ICD-10-CM | POA: Diagnosis not present

## 2017-01-25 DIAGNOSIS — R269 Unspecified abnormalities of gait and mobility: Secondary | ICD-10-CM | POA: Diagnosis not present

## 2017-01-25 DIAGNOSIS — R262 Difficulty in walking, not elsewhere classified: Secondary | ICD-10-CM | POA: Diagnosis not present

## 2017-01-25 DIAGNOSIS — M25562 Pain in left knee: Secondary | ICD-10-CM | POA: Diagnosis not present

## 2017-02-01 DIAGNOSIS — M25562 Pain in left knee: Secondary | ICD-10-CM | POA: Diagnosis not present

## 2017-02-01 DIAGNOSIS — M6281 Muscle weakness (generalized): Secondary | ICD-10-CM | POA: Diagnosis not present

## 2017-02-01 DIAGNOSIS — R262 Difficulty in walking, not elsewhere classified: Secondary | ICD-10-CM | POA: Diagnosis not present

## 2017-02-01 DIAGNOSIS — R269 Unspecified abnormalities of gait and mobility: Secondary | ICD-10-CM | POA: Diagnosis not present

## 2017-02-07 DIAGNOSIS — H401132 Primary open-angle glaucoma, bilateral, moderate stage: Secondary | ICD-10-CM | POA: Diagnosis not present

## 2017-02-10 DIAGNOSIS — M25562 Pain in left knee: Secondary | ICD-10-CM | POA: Diagnosis not present

## 2017-02-10 DIAGNOSIS — M6281 Muscle weakness (generalized): Secondary | ICD-10-CM | POA: Diagnosis not present

## 2017-02-10 DIAGNOSIS — R262 Difficulty in walking, not elsewhere classified: Secondary | ICD-10-CM | POA: Diagnosis not present

## 2017-02-10 DIAGNOSIS — R269 Unspecified abnormalities of gait and mobility: Secondary | ICD-10-CM | POA: Diagnosis not present

## 2017-02-17 DIAGNOSIS — R269 Unspecified abnormalities of gait and mobility: Secondary | ICD-10-CM | POA: Diagnosis not present

## 2017-02-17 DIAGNOSIS — M25562 Pain in left knee: Secondary | ICD-10-CM | POA: Diagnosis not present

## 2017-02-17 DIAGNOSIS — R262 Difficulty in walking, not elsewhere classified: Secondary | ICD-10-CM | POA: Diagnosis not present

## 2017-02-17 DIAGNOSIS — M6281 Muscle weakness (generalized): Secondary | ICD-10-CM | POA: Diagnosis not present

## 2017-02-22 DIAGNOSIS — R262 Difficulty in walking, not elsewhere classified: Secondary | ICD-10-CM | POA: Diagnosis not present

## 2017-02-22 DIAGNOSIS — M25562 Pain in left knee: Secondary | ICD-10-CM | POA: Diagnosis not present

## 2017-02-22 DIAGNOSIS — M6281 Muscle weakness (generalized): Secondary | ICD-10-CM | POA: Diagnosis not present

## 2017-02-22 DIAGNOSIS — R269 Unspecified abnormalities of gait and mobility: Secondary | ICD-10-CM | POA: Diagnosis not present

## 2017-02-24 DIAGNOSIS — R262 Difficulty in walking, not elsewhere classified: Secondary | ICD-10-CM | POA: Diagnosis not present

## 2017-02-24 DIAGNOSIS — M25562 Pain in left knee: Secondary | ICD-10-CM | POA: Diagnosis not present

## 2017-02-24 DIAGNOSIS — M6281 Muscle weakness (generalized): Secondary | ICD-10-CM | POA: Diagnosis not present

## 2017-02-24 DIAGNOSIS — R269 Unspecified abnormalities of gait and mobility: Secondary | ICD-10-CM | POA: Diagnosis not present

## 2017-03-02 DIAGNOSIS — M25562 Pain in left knee: Secondary | ICD-10-CM | POA: Diagnosis not present

## 2017-03-02 DIAGNOSIS — R269 Unspecified abnormalities of gait and mobility: Secondary | ICD-10-CM | POA: Diagnosis not present

## 2017-03-02 DIAGNOSIS — R262 Difficulty in walking, not elsewhere classified: Secondary | ICD-10-CM | POA: Diagnosis not present

## 2017-03-02 DIAGNOSIS — M6281 Muscle weakness (generalized): Secondary | ICD-10-CM | POA: Diagnosis not present

## 2017-03-10 DIAGNOSIS — R269 Unspecified abnormalities of gait and mobility: Secondary | ICD-10-CM | POA: Diagnosis not present

## 2017-03-10 DIAGNOSIS — M25562 Pain in left knee: Secondary | ICD-10-CM | POA: Diagnosis not present

## 2017-03-10 DIAGNOSIS — R262 Difficulty in walking, not elsewhere classified: Secondary | ICD-10-CM | POA: Diagnosis not present

## 2017-03-10 DIAGNOSIS — M6281 Muscle weakness (generalized): Secondary | ICD-10-CM | POA: Diagnosis not present

## 2017-03-15 DIAGNOSIS — R269 Unspecified abnormalities of gait and mobility: Secondary | ICD-10-CM | POA: Diagnosis not present

## 2017-03-15 DIAGNOSIS — M25562 Pain in left knee: Secondary | ICD-10-CM | POA: Diagnosis not present

## 2017-03-15 DIAGNOSIS — R262 Difficulty in walking, not elsewhere classified: Secondary | ICD-10-CM | POA: Diagnosis not present

## 2017-03-15 DIAGNOSIS — M6281 Muscle weakness (generalized): Secondary | ICD-10-CM | POA: Diagnosis not present

## 2017-03-17 DIAGNOSIS — M859 Disorder of bone density and structure, unspecified: Secondary | ICD-10-CM | POA: Diagnosis not present

## 2017-03-17 DIAGNOSIS — R030 Elevated blood-pressure reading, without diagnosis of hypertension: Secondary | ICD-10-CM | POA: Diagnosis not present

## 2017-03-17 DIAGNOSIS — E784 Other hyperlipidemia: Secondary | ICD-10-CM | POA: Diagnosis not present

## 2017-03-17 DIAGNOSIS — R269 Unspecified abnormalities of gait and mobility: Secondary | ICD-10-CM | POA: Diagnosis not present

## 2017-03-17 DIAGNOSIS — M6281 Muscle weakness (generalized): Secondary | ICD-10-CM | POA: Diagnosis not present

## 2017-03-17 DIAGNOSIS — M25562 Pain in left knee: Secondary | ICD-10-CM | POA: Diagnosis not present

## 2017-03-17 DIAGNOSIS — R262 Difficulty in walking, not elsewhere classified: Secondary | ICD-10-CM | POA: Diagnosis not present

## 2017-03-21 DIAGNOSIS — R269 Unspecified abnormalities of gait and mobility: Secondary | ICD-10-CM | POA: Diagnosis not present

## 2017-03-21 DIAGNOSIS — R262 Difficulty in walking, not elsewhere classified: Secondary | ICD-10-CM | POA: Diagnosis not present

## 2017-03-21 DIAGNOSIS — M25562 Pain in left knee: Secondary | ICD-10-CM | POA: Diagnosis not present

## 2017-03-21 DIAGNOSIS — M6281 Muscle weakness (generalized): Secondary | ICD-10-CM | POA: Diagnosis not present

## 2017-03-24 DIAGNOSIS — Z1389 Encounter for screening for other disorder: Secondary | ICD-10-CM | POA: Diagnosis not present

## 2017-03-24 DIAGNOSIS — F329 Major depressive disorder, single episode, unspecified: Secondary | ICD-10-CM | POA: Diagnosis not present

## 2017-03-24 DIAGNOSIS — D860 Sarcoidosis of lung: Secondary | ICD-10-CM | POA: Diagnosis not present

## 2017-03-24 DIAGNOSIS — Z6835 Body mass index (BMI) 35.0-35.9, adult: Secondary | ICD-10-CM | POA: Diagnosis not present

## 2017-03-24 DIAGNOSIS — Z0000 Encounter for general adult medical examination without abnormal findings: Secondary | ICD-10-CM | POA: Diagnosis not present

## 2017-03-24 DIAGNOSIS — C4491 Basal cell carcinoma of skin, unspecified: Secondary | ICD-10-CM | POA: Diagnosis not present

## 2017-03-24 DIAGNOSIS — E784 Other hyperlipidemia: Secondary | ICD-10-CM | POA: Diagnosis not present

## 2017-03-24 DIAGNOSIS — Z1231 Encounter for screening mammogram for malignant neoplasm of breast: Secondary | ICD-10-CM | POA: Diagnosis not present

## 2017-03-24 DIAGNOSIS — E668 Other obesity: Secondary | ICD-10-CM | POA: Diagnosis not present

## 2017-03-24 DIAGNOSIS — M859 Disorder of bone density and structure, unspecified: Secondary | ICD-10-CM | POA: Diagnosis not present

## 2017-03-24 DIAGNOSIS — H4089 Other specified glaucoma: Secondary | ICD-10-CM | POA: Diagnosis not present

## 2017-03-24 DIAGNOSIS — R2689 Other abnormalities of gait and mobility: Secondary | ICD-10-CM | POA: Diagnosis not present

## 2017-03-29 DIAGNOSIS — R269 Unspecified abnormalities of gait and mobility: Secondary | ICD-10-CM | POA: Diagnosis not present

## 2017-03-29 DIAGNOSIS — M6281 Muscle weakness (generalized): Secondary | ICD-10-CM | POA: Diagnosis not present

## 2017-03-29 DIAGNOSIS — M25562 Pain in left knee: Secondary | ICD-10-CM | POA: Diagnosis not present

## 2017-03-29 DIAGNOSIS — R262 Difficulty in walking, not elsewhere classified: Secondary | ICD-10-CM | POA: Diagnosis not present

## 2017-03-31 DIAGNOSIS — M6281 Muscle weakness (generalized): Secondary | ICD-10-CM | POA: Diagnosis not present

## 2017-03-31 DIAGNOSIS — R269 Unspecified abnormalities of gait and mobility: Secondary | ICD-10-CM | POA: Diagnosis not present

## 2017-03-31 DIAGNOSIS — M25562 Pain in left knee: Secondary | ICD-10-CM | POA: Diagnosis not present

## 2017-03-31 DIAGNOSIS — R262 Difficulty in walking, not elsewhere classified: Secondary | ICD-10-CM | POA: Diagnosis not present

## 2017-04-05 DIAGNOSIS — R262 Difficulty in walking, not elsewhere classified: Secondary | ICD-10-CM | POA: Diagnosis not present

## 2017-04-05 DIAGNOSIS — M25562 Pain in left knee: Secondary | ICD-10-CM | POA: Diagnosis not present

## 2017-04-05 DIAGNOSIS — R269 Unspecified abnormalities of gait and mobility: Secondary | ICD-10-CM | POA: Diagnosis not present

## 2017-04-05 DIAGNOSIS — M6281 Muscle weakness (generalized): Secondary | ICD-10-CM | POA: Diagnosis not present

## 2017-04-07 DIAGNOSIS — M6281 Muscle weakness (generalized): Secondary | ICD-10-CM | POA: Diagnosis not present

## 2017-04-07 DIAGNOSIS — R262 Difficulty in walking, not elsewhere classified: Secondary | ICD-10-CM | POA: Diagnosis not present

## 2017-04-07 DIAGNOSIS — R269 Unspecified abnormalities of gait and mobility: Secondary | ICD-10-CM | POA: Diagnosis not present

## 2017-04-07 DIAGNOSIS — M25562 Pain in left knee: Secondary | ICD-10-CM | POA: Diagnosis not present

## 2017-04-12 DIAGNOSIS — R269 Unspecified abnormalities of gait and mobility: Secondary | ICD-10-CM | POA: Diagnosis not present

## 2017-04-12 DIAGNOSIS — M25562 Pain in left knee: Secondary | ICD-10-CM | POA: Diagnosis not present

## 2017-04-12 DIAGNOSIS — R262 Difficulty in walking, not elsewhere classified: Secondary | ICD-10-CM | POA: Diagnosis not present

## 2017-04-12 DIAGNOSIS — M6281 Muscle weakness (generalized): Secondary | ICD-10-CM | POA: Diagnosis not present

## 2017-04-14 DIAGNOSIS — M6281 Muscle weakness (generalized): Secondary | ICD-10-CM | POA: Diagnosis not present

## 2017-04-14 DIAGNOSIS — M25562 Pain in left knee: Secondary | ICD-10-CM | POA: Diagnosis not present

## 2017-04-14 DIAGNOSIS — R269 Unspecified abnormalities of gait and mobility: Secondary | ICD-10-CM | POA: Diagnosis not present

## 2017-04-14 DIAGNOSIS — R262 Difficulty in walking, not elsewhere classified: Secondary | ICD-10-CM | POA: Diagnosis not present

## 2017-04-26 DIAGNOSIS — M6281 Muscle weakness (generalized): Secondary | ICD-10-CM | POA: Diagnosis not present

## 2017-04-26 DIAGNOSIS — M25562 Pain in left knee: Secondary | ICD-10-CM | POA: Diagnosis not present

## 2017-04-26 DIAGNOSIS — R262 Difficulty in walking, not elsewhere classified: Secondary | ICD-10-CM | POA: Diagnosis not present

## 2017-04-26 DIAGNOSIS — R269 Unspecified abnormalities of gait and mobility: Secondary | ICD-10-CM | POA: Diagnosis not present

## 2017-05-03 DIAGNOSIS — R269 Unspecified abnormalities of gait and mobility: Secondary | ICD-10-CM | POA: Diagnosis not present

## 2017-05-03 DIAGNOSIS — M6281 Muscle weakness (generalized): Secondary | ICD-10-CM | POA: Diagnosis not present

## 2017-05-03 DIAGNOSIS — M25562 Pain in left knee: Secondary | ICD-10-CM | POA: Diagnosis not present

## 2017-05-03 DIAGNOSIS — R262 Difficulty in walking, not elsewhere classified: Secondary | ICD-10-CM | POA: Diagnosis not present

## 2017-05-10 DIAGNOSIS — R269 Unspecified abnormalities of gait and mobility: Secondary | ICD-10-CM | POA: Diagnosis not present

## 2017-05-10 DIAGNOSIS — M25562 Pain in left knee: Secondary | ICD-10-CM | POA: Diagnosis not present

## 2017-05-10 DIAGNOSIS — M6281 Muscle weakness (generalized): Secondary | ICD-10-CM | POA: Diagnosis not present

## 2017-05-10 DIAGNOSIS — R262 Difficulty in walking, not elsewhere classified: Secondary | ICD-10-CM | POA: Diagnosis not present

## 2017-08-12 DIAGNOSIS — H401132 Primary open-angle glaucoma, bilateral, moderate stage: Secondary | ICD-10-CM | POA: Diagnosis not present

## 2017-08-15 DIAGNOSIS — H401132 Primary open-angle glaucoma, bilateral, moderate stage: Secondary | ICD-10-CM | POA: Diagnosis not present

## 2017-09-12 DIAGNOSIS — Z23 Encounter for immunization: Secondary | ICD-10-CM | POA: Diagnosis not present

## 2017-09-14 DIAGNOSIS — Z6835 Body mass index (BMI) 35.0-35.9, adult: Secondary | ICD-10-CM | POA: Diagnosis not present

## 2017-09-14 DIAGNOSIS — I872 Venous insufficiency (chronic) (peripheral): Secondary | ICD-10-CM | POA: Diagnosis not present

## 2017-09-14 DIAGNOSIS — K589 Irritable bowel syndrome without diarrhea: Secondary | ICD-10-CM | POA: Diagnosis not present

## 2017-09-14 DIAGNOSIS — R030 Elevated blood-pressure reading, without diagnosis of hypertension: Secondary | ICD-10-CM | POA: Diagnosis not present

## 2017-11-07 DIAGNOSIS — M859 Disorder of bone density and structure, unspecified: Secondary | ICD-10-CM | POA: Diagnosis not present

## 2017-12-13 DIAGNOSIS — H401132 Primary open-angle glaucoma, bilateral, moderate stage: Secondary | ICD-10-CM | POA: Diagnosis not present

## 2018-04-20 DIAGNOSIS — H2512 Age-related nuclear cataract, left eye: Secondary | ICD-10-CM | POA: Diagnosis not present

## 2018-05-22 DIAGNOSIS — M859 Disorder of bone density and structure, unspecified: Secondary | ICD-10-CM | POA: Diagnosis not present

## 2018-05-22 DIAGNOSIS — R82998 Other abnormal findings in urine: Secondary | ICD-10-CM | POA: Diagnosis not present

## 2018-05-22 DIAGNOSIS — E7849 Other hyperlipidemia: Secondary | ICD-10-CM | POA: Diagnosis not present

## 2018-06-05 DIAGNOSIS — Z0000 Encounter for general adult medical examination without abnormal findings: Secondary | ICD-10-CM | POA: Diagnosis not present

## 2018-06-05 DIAGNOSIS — R030 Elevated blood-pressure reading, without diagnosis of hypertension: Secondary | ICD-10-CM | POA: Diagnosis not present

## 2018-06-05 DIAGNOSIS — K589 Irritable bowel syndrome without diarrhea: Secondary | ICD-10-CM | POA: Diagnosis not present

## 2018-06-05 DIAGNOSIS — D860 Sarcoidosis of lung: Secondary | ICD-10-CM | POA: Diagnosis not present

## 2018-06-05 DIAGNOSIS — Z6834 Body mass index (BMI) 34.0-34.9, adult: Secondary | ICD-10-CM | POA: Diagnosis not present

## 2018-06-05 DIAGNOSIS — I872 Venous insufficiency (chronic) (peripheral): Secondary | ICD-10-CM | POA: Diagnosis not present

## 2018-06-05 DIAGNOSIS — Z1389 Encounter for screening for other disorder: Secondary | ICD-10-CM | POA: Diagnosis not present

## 2018-06-05 DIAGNOSIS — R300 Dysuria: Secondary | ICD-10-CM | POA: Diagnosis not present

## 2018-06-05 DIAGNOSIS — R2689 Other abnormalities of gait and mobility: Secondary | ICD-10-CM | POA: Diagnosis not present

## 2018-06-05 DIAGNOSIS — E7849 Other hyperlipidemia: Secondary | ICD-10-CM | POA: Diagnosis not present

## 2018-06-05 DIAGNOSIS — M859 Disorder of bone density and structure, unspecified: Secondary | ICD-10-CM | POA: Diagnosis not present

## 2018-06-05 DIAGNOSIS — C4491 Basal cell carcinoma of skin, unspecified: Secondary | ICD-10-CM | POA: Diagnosis not present

## 2018-06-15 DIAGNOSIS — Z6834 Body mass index (BMI) 34.0-34.9, adult: Secondary | ICD-10-CM | POA: Diagnosis not present

## 2018-06-15 DIAGNOSIS — T50Z95A Adverse effect of other vaccines and biological substances, initial encounter: Secondary | ICD-10-CM | POA: Diagnosis not present

## 2018-07-27 DIAGNOSIS — H2512 Age-related nuclear cataract, left eye: Secondary | ICD-10-CM | POA: Diagnosis not present

## 2018-08-01 ENCOUNTER — Encounter: Payer: Self-pay | Admitting: *Deleted

## 2018-08-01 ENCOUNTER — Other Ambulatory Visit: Payer: Self-pay

## 2018-08-03 NOTE — Discharge Instructions (Signed)

## 2018-08-09 ENCOUNTER — Ambulatory Visit: Payer: Medicare Other | Admitting: Anesthesiology

## 2018-08-09 ENCOUNTER — Ambulatory Visit
Admission: RE | Admit: 2018-08-09 | Discharge: 2018-08-09 | Disposition: A | Discharge status: Home / Self Care | Payer: Medicare Other | Source: Ambulatory Visit | Attending: Ophthalmology | Admitting: Ophthalmology

## 2018-08-09 ENCOUNTER — Encounter
Admission: RE | Disposition: A | Discharge status: Home / Self Care | Payer: Self-pay | Source: Ambulatory Visit | Attending: Ophthalmology

## 2018-08-09 DIAGNOSIS — H4010X2 Unspecified open-angle glaucoma, moderate stage: Secondary | ICD-10-CM | POA: Insufficient documentation

## 2018-08-09 DIAGNOSIS — Z85828 Personal history of other malignant neoplasm of skin: Secondary | ICD-10-CM | POA: Diagnosis not present

## 2018-08-09 DIAGNOSIS — E7800 Pure hypercholesterolemia, unspecified: Secondary | ICD-10-CM | POA: Diagnosis not present

## 2018-08-09 DIAGNOSIS — Z79899 Other long term (current) drug therapy: Secondary | ICD-10-CM | POA: Insufficient documentation

## 2018-08-09 DIAGNOSIS — H25812 Combined forms of age-related cataract, left eye: Secondary | ICD-10-CM | POA: Diagnosis not present

## 2018-08-09 DIAGNOSIS — H2512 Age-related nuclear cataract, left eye: Secondary | ICD-10-CM | POA: Insufficient documentation

## 2018-08-09 DIAGNOSIS — Z7982 Long term (current) use of aspirin: Secondary | ICD-10-CM | POA: Insufficient documentation

## 2018-08-09 DIAGNOSIS — H401122 Primary open-angle glaucoma, left eye, moderate stage: Secondary | ICD-10-CM | POA: Diagnosis not present

## 2018-08-09 DIAGNOSIS — F329 Major depressive disorder, single episode, unspecified: Secondary | ICD-10-CM | POA: Insufficient documentation

## 2018-08-09 HISTORY — PX: INSERTION OF ANTERIOR SEGMENT AQUEOUS DRAINAGE DEVICE (ISTENT): SHX6783

## 2018-08-09 HISTORY — PX: CATARACT EXTRACTION W/PHACO: SHX586

## 2018-08-09 SURGERY — PHACOEMULSIFICATION, CATARACT, WITH IOL INSERTION
Anesthesia: Monitor Anesthesia Care | Site: Eye | Laterality: Left

## 2018-08-09 MED ORDER — EPINEPHRINE PF 1 MG/ML IJ SOLN
INTRAOCULAR | Status: DC | PRN
  Administered 2018-08-09: 82 mL via OPHTHALMIC

## 2018-08-09 MED ORDER — CEFUROXIME OPHTHALMIC INJECTION 1 MG/0.1 ML
INJECTION | OPHTHALMIC | Status: DC | PRN
  Administered 2018-08-09: 0.1 mL via INTRACAMERAL

## 2018-08-09 MED ORDER — ARMC OPHTHALMIC DILATING DROPS
1.0000 "application " | OPHTHALMIC | Status: DC | PRN
  Administered 2018-08-09 (×3): 1 via OPHTHALMIC

## 2018-08-09 MED ORDER — LACTATED RINGERS IV SOLN: 10.0000 mL/h | INTRAVENOUS | Status: DC

## 2018-08-09 MED ORDER — MOXIFLOXACIN HCL 0.5 % OP SOLN
1.0000 [drp] | OPHTHALMIC | Status: DC | PRN
  Administered 2018-08-09 (×3): 1 [drp] via OPHTHALMIC

## 2018-08-09 MED ORDER — ONDANSETRON HCL 4 MG/2ML IJ SOLN: 4.0000 mg | Freq: Once | INTRAMUSCULAR | Status: DC | PRN

## 2018-08-09 MED ORDER — LIDOCAINE HCL (PF) 2 % IJ SOLN
INTRAOCULAR | Status: DC | PRN
  Administered 2018-08-09: 1 mL

## 2018-08-09 MED ORDER — MIDAZOLAM HCL 2 MG/2ML IJ SOLN
INTRAMUSCULAR | Status: DC | PRN
  Administered 2018-08-09 (×2): 0.5 mg via INTRAVENOUS

## 2018-08-09 MED ORDER — NA HYALUR & NA CHOND-NA HYALUR 0.4-0.35 ML IO KIT
PACK | INTRAOCULAR | Status: DC | PRN
  Administered 2018-08-09: 1 mL via INTRAOCULAR

## 2018-08-09 MED ORDER — BRIMONIDINE TARTRATE-TIMOLOL 0.2-0.5 % OP SOLN
OPHTHALMIC | Status: DC | PRN
  Administered 2018-08-09: 1 [drp] via OPHTHALMIC

## 2018-08-09 MED ORDER — FENTANYL CITRATE (PF) 100 MCG/2ML IJ SOLN
INTRAMUSCULAR | Status: DC | PRN
  Administered 2018-08-09 (×2): 25 ug via INTRAVENOUS

## 2018-08-09 SURGICAL SUPPLY — 23 items
CANNULA ANT/CHMB 27G (MISCELLANEOUS) ×1 IMPLANT
CANNULA ANT/CHMB 27GA (MISCELLANEOUS) ×2 IMPLANT
DEVICE INJECT ISTENT W (Stent) IMPLANT
GLOVE SURG LX 7.5 STRW (GLOVE) ×1
GLOVE SURG LX STRL 7.5 STRW (GLOVE) ×1 IMPLANT
GLOVE SURG TRIUMPH 8.0 PF LTX (GLOVE) ×2 IMPLANT
GOWN STRL REUS W/ TWL LRG LVL3 (GOWN DISPOSABLE) ×2 IMPLANT
GOWN STRL REUS W/TWL LRG LVL3 (GOWN DISPOSABLE) ×4
ICLIP (OPHTHALMIC RELATED) ×1 IMPLANT
INJECT ISTENT W (Stent) ×2 IMPLANT
LENS IOL TECNIS ITEC 19.5 (Intraocular Lens) ×1 IMPLANT
MARKER SKIN DUAL TIP RULER LAB (MISCELLANEOUS) ×2 IMPLANT
NDL FILTER BLUNT 18X1 1/2 (NEEDLE) ×1 IMPLANT
NEEDLE FILTER BLUNT 18X 1/2SAF (NEEDLE) ×1
NEEDLE FILTER BLUNT 18X1 1/2 (NEEDLE) ×1 IMPLANT
PACK CATARACT BRASINGTON (MISCELLANEOUS) ×2 IMPLANT
PACK EYE AFTER SURG (MISCELLANEOUS) ×2 IMPLANT
PACK OPTHALMIC (MISCELLANEOUS) ×2 IMPLANT
SYR 3ML LL SCALE MARK (SYRINGE) ×2 IMPLANT
SYR 5ML LL (SYRINGE) ×2 IMPLANT
SYR TB 1ML LUER SLIP (SYRINGE) ×2 IMPLANT
WATER STERILE IRR 500ML POUR (IV SOLUTION) ×2 IMPLANT
WIPE NON LINTING 3.25X3.25 (MISCELLANEOUS) ×2 IMPLANT

## 2018-08-09 NOTE — H&P (Signed)
The History and Physical notes are on paper, have been signed, and are to be scanned. The patient remains stable and unchanged from the H&P.   Previous H&P reviewed, patient examined, and there are no changes.  Meigan Pates 08/09/2018 9:00 AM

## 2018-08-09 NOTE — Anesthesia Procedure Notes (Signed)
Procedure Name: MAC Date/Time: 08/09/2018 9:46 AM Performed by: Lind Guest, CRNA Pre-anesthesia Checklist: Patient identified, Emergency Drugs available, Suction available, Patient being monitored and Timeout performed Patient Re-evaluated:Patient Re-evaluated prior to induction Oxygen Delivery Method: Nasal cannula

## 2018-08-09 NOTE — Op Note (Addendum)
OPERATIVE NOTE  Jaime [name] [id number] 08/09/2018   PREOPERATIVE DIAGNOSIS:  Nuclear sclerotic cataract left eye. H25.12 Moderate open angle glaucoma left eye   POSTOPERATIVE DIAGNOSIS:    Nuclear sclerotic cataract left eye.  Moderate open angle glaucoma left eye   PROCEDURE:  Phacoemusification with posterior chamber intraocular lens placement of the left eye  Placement of istent  Inject left eye (2 stents)  LENS:   Implant Name Type Inv. Item Serial No. Manufacturer Lot No. LRB No. Used  Tamsen Meek - [id number] Stent INJECT ISTENT [id number] [employer] [id number] Left 1  LENS IOL DIOP 19.5 - [id number] Intraocular Lens LENS IOL DIOP 19.5 [id number] AMO  Left 1        ULTRASOUND TIME: 15  % of 1 minutes 39 seconds, CDE 14.9  SURGEON:  Wyonia Hough, MD   ANESTHESIA:  Topical with tetracaine drops and 2% Xylocaine jelly, augmented with 1% preservative-free intracameral lidocaine.    COMPLICATIONS:  None.   DESCRIPTION OF PROCEDURE:  The patient was identified in the holding room and transported to the operating room and placed in the supine position under the operating microscope.  The left eye was identified as the operative eye and it was prepped and draped in the usual sterile ophthalmic fashion.   A 1 millimeter clear-corneal paracentesis was made at the 1:30 position.  0.5 ml of preservative-free 1% lidocaine was injected into the anterior chamber.  The anterior chamber was filled with Viscoat viscoelastic.  A 2.4 millimeter keratome was used to make a near-clear corneal incision at the 10:30 position. The microscope was adjusted and a gonioprism was used to visulaize the trabecular meshwork.  The istent injector was advanced across the anterior chamber under viscoelastic.  The first iStent inject was deployed into the trabecular meshwork at the 9:30 position.  The second iStent inject was placed two clock hours counterclockwise into thje meshwork.  Proper  postioning was confirmed.  .  A curvilinear capsulorrhexis was made with a cystotome and capsulorrhexis forceps.  Balanced salt solution was used to hydrodissect and hydrodelineate the nucleus.   Phacoemulsification was then used in stop and chop fashion to remove the lens nucleus and epinucleus.  The remaining cortex was then removed using the irrigation and aspiration handpiece. Provisc was then placed into the capsular bag to distend it for lens placement.  A lens was then injected into the capsular bag.  The remaining viscoelastic was aspirated.   Wounds were hydrated with balanced salt solution.  The anterior chamber was inflated to a physiologic pressure with balanced salt solution.  No wound leaks were noted. Cefuroxime 0.1 ml of a 10mg /ml solution was injected into the anterior chamber for a dose of 1 mg of intracameral antibiotic at the completion of the case.   Timolol and Brimonidine drops were applied to the eye.  The patient was taken to the recovery room in stable condition without complications of anesthesia or surgery.  [name] 08/09/2018, 10:08 AM

## 2018-08-09 NOTE — Anesthesia Preprocedure Evaluation (Signed)
Anesthesia Evaluation  Patient identified by MRN, date of birth, ID band Patient awake    Reviewed: Allergy & Precautions, H&P , NPO status , Patient's Chart, lab work & pertinent test results  Airway Mallampati: II  TM Distance: >3 FB Neck ROM: full    Dental no notable dental hx.    Pulmonary    Pulmonary exam normal breath sounds clear to auscultation       Cardiovascular Normal cardiovascular exam Rhythm:regular Rate:Normal     Neuro/Psych    GI/Hepatic   Endo/Other    Renal/GU      Musculoskeletal   Abdominal   Peds  Hematology   Anesthesia Other Findings   Reproductive/Obstetrics                             Anesthesia Physical Anesthesia Plan  ASA: II  Anesthesia Plan: MAC   Post-op Pain Management:    Induction:   PONV Risk Score and Plan: 2 and Midazolam and Treatment may vary due to age or medical condition  Airway Management Planned:   Additional Equipment:   Intra-op Plan:   Post-operative Plan:   Informed Consent: I have reviewed the patients History and Physical, chart, labs and discussed the procedure including the risks, benefits and alternatives for the proposed anesthesia with the patient or authorized representative who has indicated his/her understanding and acceptance.       Plan Discussed with: CRNA  Anesthesia Plan Comments:         Anesthesia Quick Evaluation  

## 2018-08-09 NOTE — Transfer of Care (Signed)
Immediate Anesthesia Transfer of Care Note  Patient: Jaime [name]  Procedure(s) Performed: CATARACT EXTRACTION PHACO AND INTRAOCULAR LENS PLACEMENT (IOC)  ISTENT AND ISTENT INJECT LEFT (Left Eye) INSERTION OF ANTERIOR SEGMENT AQUEOUS DRAINAGE DEVICE (ISTENT) (Left Eye)  Patient Location: PACU  Anesthesia Type: MAC  Level of Consciousness: awake, alert  and patient cooperative  Airway and Oxygen Therapy: Patient Spontanous Breathing and Patient connected to supplemental oxygen  Post-op Assessment: Post-op Vital signs reviewed, Patient's Cardiovascular Status Stable, Respiratory Function Stable, Patent Airway and No signs of Nausea or vomiting  Post-op Vital Signs: Reviewed and stable  Complications: No apparent anesthesia complications

## 2018-08-09 NOTE — Anesthesia Postprocedure Evaluation (Signed)
Anesthesia Post Note  Patient: Jaime [name]  Procedure(s) Performed: CATARACT EXTRACTION PHACO AND INTRAOCULAR LENS PLACEMENT (IOC)  ISTENT AND ISTENT INJECT LEFT (Left Eye) INSERTION OF ANTERIOR SEGMENT AQUEOUS DRAINAGE DEVICE (ISTENT) (Left Eye)  Patient location during evaluation: PACU Anesthesia Type: MAC Level of consciousness: awake and alert and oriented Pain management: satisfactory to patient Vital Signs Assessment: post-procedure vital signs reviewed and stable Respiratory status: spontaneous breathing, nonlabored ventilation and respiratory function stable Cardiovascular status: blood pressure returned to baseline and stable Postop Assessment: Adequate PO intake and No signs of nausea or vomiting Anesthetic complications: no    Raliegh Ip

## 2018-08-10 ENCOUNTER — Encounter: Payer: Self-pay | Admitting: Ophthalmology

## 2018-10-11 DIAGNOSIS — Z23 Encounter for immunization: Secondary | ICD-10-CM | POA: Diagnosis not present

## 2019-01-03 DIAGNOSIS — I87319 Chronic venous hypertension (idiopathic) with ulcer of unspecified lower extremity: Secondary | ICD-10-CM | POA: Diagnosis not present

## 2019-01-03 DIAGNOSIS — R030 Elevated blood-pressure reading, without diagnosis of hypertension: Secondary | ICD-10-CM | POA: Diagnosis not present

## 2019-01-03 DIAGNOSIS — Z6833 Body mass index (BMI) 33.0-33.9, adult: Secondary | ICD-10-CM | POA: Diagnosis not present

## 2019-01-03 DIAGNOSIS — L03116 Cellulitis of left lower limb: Secondary | ICD-10-CM | POA: Diagnosis not present

## 2019-01-18 DIAGNOSIS — H401122 Primary open-angle glaucoma, left eye, moderate stage: Secondary | ICD-10-CM | POA: Diagnosis not present

## 2019-01-25 DIAGNOSIS — H401123 Primary open-angle glaucoma, left eye, severe stage: Secondary | ICD-10-CM | POA: Diagnosis not present

## 2019-05-29 DIAGNOSIS — H401123 Primary open-angle glaucoma, left eye, severe stage: Secondary | ICD-10-CM | POA: Diagnosis not present

## 2019-06-29 DIAGNOSIS — M859 Disorder of bone density and structure, unspecified: Secondary | ICD-10-CM | POA: Diagnosis not present

## 2019-06-29 DIAGNOSIS — E7849 Other hyperlipidemia: Secondary | ICD-10-CM | POA: Diagnosis not present

## 2019-07-04 DIAGNOSIS — R82998 Other abnormal findings in urine: Secondary | ICD-10-CM | POA: Diagnosis not present

## 2019-07-06 DIAGNOSIS — I872 Venous insufficiency (chronic) (peripheral): Secondary | ICD-10-CM | POA: Diagnosis not present

## 2019-07-06 DIAGNOSIS — F329 Major depressive disorder, single episode, unspecified: Secondary | ICD-10-CM | POA: Diagnosis not present

## 2019-07-06 DIAGNOSIS — R030 Elevated blood-pressure reading, without diagnosis of hypertension: Secondary | ICD-10-CM | POA: Diagnosis not present

## 2019-07-06 DIAGNOSIS — K589 Irritable bowel syndrome without diarrhea: Secondary | ICD-10-CM | POA: Diagnosis not present

## 2019-07-06 DIAGNOSIS — Z0000 Encounter for general adult medical examination without abnormal findings: Secondary | ICD-10-CM | POA: Diagnosis not present

## 2019-07-06 DIAGNOSIS — R3121 Asymptomatic microscopic hematuria: Secondary | ICD-10-CM | POA: Diagnosis not present

## 2019-07-06 DIAGNOSIS — I87319 Chronic venous hypertension (idiopathic) with ulcer of unspecified lower extremity: Secondary | ICD-10-CM | POA: Diagnosis not present

## 2019-07-06 DIAGNOSIS — C4491 Basal cell carcinoma of skin, unspecified: Secondary | ICD-10-CM | POA: Diagnosis not present

## 2019-07-06 DIAGNOSIS — M8580 Other specified disorders of bone density and structure, unspecified site: Secondary | ICD-10-CM | POA: Diagnosis not present

## 2019-07-06 DIAGNOSIS — E785 Hyperlipidemia, unspecified: Secondary | ICD-10-CM | POA: Diagnosis not present

## 2019-07-06 DIAGNOSIS — R269 Unspecified abnormalities of gait and mobility: Secondary | ICD-10-CM | POA: Diagnosis not present

## 2019-07-06 DIAGNOSIS — E669 Obesity, unspecified: Secondary | ICD-10-CM | POA: Diagnosis not present

## 2019-07-10 DIAGNOSIS — Z1339 Encounter for screening examination for other mental health and behavioral disorders: Secondary | ICD-10-CM | POA: Diagnosis not present

## 2019-07-10 DIAGNOSIS — Z1331 Encounter for screening for depression: Secondary | ICD-10-CM | POA: Diagnosis not present

## 2019-08-16 DIAGNOSIS — Z23 Encounter for immunization: Secondary | ICD-10-CM | POA: Diagnosis not present

## 2020-01-08 DIAGNOSIS — K589 Irritable bowel syndrome without diarrhea: Secondary | ICD-10-CM | POA: Diagnosis not present

## 2020-01-08 DIAGNOSIS — F329 Major depressive disorder, single episode, unspecified: Secondary | ICD-10-CM | POA: Diagnosis not present

## 2020-01-08 DIAGNOSIS — M179 Osteoarthritis of knee, unspecified: Secondary | ICD-10-CM | POA: Diagnosis not present

## 2020-01-08 DIAGNOSIS — N318 Other neuromuscular dysfunction of bladder: Secondary | ICD-10-CM | POA: Diagnosis not present

## 2020-01-08 DIAGNOSIS — R030 Elevated blood-pressure reading, without diagnosis of hypertension: Secondary | ICD-10-CM | POA: Diagnosis not present

## 2020-01-08 DIAGNOSIS — E785 Hyperlipidemia, unspecified: Secondary | ICD-10-CM | POA: Diagnosis not present

## 2020-01-22 DIAGNOSIS — H401123 Primary open-angle glaucoma, left eye, severe stage: Secondary | ICD-10-CM | POA: Diagnosis not present

## 2020-01-29 DIAGNOSIS — M8589 Other specified disorders of bone density and structure, multiple sites: Secondary | ICD-10-CM | POA: Diagnosis not present

## 2020-05-02 DIAGNOSIS — N390 Urinary tract infection, site not specified: Secondary | ICD-10-CM | POA: Diagnosis not present

## 2020-05-02 DIAGNOSIS — R300 Dysuria: Secondary | ICD-10-CM | POA: Diagnosis not present

## 2020-07-14 DIAGNOSIS — U071 COVID-19: Secondary | ICD-10-CM | POA: Diagnosis not present

## 2020-07-14 DIAGNOSIS — D860 Sarcoidosis of lung: Secondary | ICD-10-CM | POA: Diagnosis not present

## 2020-07-14 DIAGNOSIS — R05 Cough: Secondary | ICD-10-CM | POA: Diagnosis not present

## 2020-07-14 DIAGNOSIS — J302 Other seasonal allergic rhinitis: Secondary | ICD-10-CM | POA: Diagnosis not present

## 2020-07-16 ENCOUNTER — Telehealth: Payer: Self-pay | Admitting: Physician Assistant

## 2020-07-16 NOTE — Telephone Encounter (Signed)
Called to discuss with [name] about Covid symptoms and the use of casirivimab and imdevimab, a monoclonal antibody infusion for those with mild to moderate Covid symptoms and at a high risk of hospitalization.     Pt is qualified for this infusion at the [facility] infusion center due to co-morbid conditions and/or a member of an at-risk group, however declines infusion at this time. Symptoms tier reviewed as well as criteria for ending isolation.  Symptoms reviewed that would warrant ED/Hospital evaluation. Preventative practices reviewed. Patient verbalized understanding. Patient advised to call back if he decides that he does want to get infusion. Callback number to the infusion center given. Patient advised to go to Urgent care or ED with severe symptoms.   Onset of symptoms  07/11/20. Hx of sarcoidosis.    Patient Active Problem List   Diagnosis Date Noted  . Acute blood loss anemia 02/28/2014  . Osteoarthritis of right knee 02/27/2014  . Total knee replacement status 02/27/2014  . HYPERLIPIDEMIA 02/13/2009  . ALLERGIC RHINITIS 02/13/2009  . PLEURAL EFFUSION, RIGHT 02/13/2009  . PULMONARY NODULE 02/13/2009     Leanor Kail, PA

## 2020-07-21 ENCOUNTER — Inpatient Hospital Stay (HOSPITAL_COMMUNITY)
Admission: EM | Admit: 2020-07-21 | Discharge: 2020-07-29 | DRG: 177 | Disposition: A | Discharge status: Skilled Nursing Facility | Payer: Medicare Other | Attending: Internal Medicine | Admitting: Internal Medicine

## 2020-07-21 ENCOUNTER — Other Ambulatory Visit: Payer: Self-pay

## 2020-07-21 ENCOUNTER — Emergency Department (HOSPITAL_COMMUNITY): Payer: Medicare Other

## 2020-07-21 ENCOUNTER — Encounter (HOSPITAL_COMMUNITY): Payer: Self-pay | Admitting: Emergency Medicine

## 2020-07-21 DIAGNOSIS — J9601 Acute respiratory failure with hypoxia: Secondary | ICD-10-CM | POA: Diagnosis present

## 2020-07-21 DIAGNOSIS — Z9842 Cataract extraction status, left eye: Secondary | ICD-10-CM | POA: Diagnosis not present

## 2020-07-21 DIAGNOSIS — Z961 Presence of intraocular lens: Secondary | ICD-10-CM | POA: Diagnosis present

## 2020-07-21 DIAGNOSIS — J1282 Pneumonia due to coronavirus disease 2019: Secondary | ICD-10-CM | POA: Diagnosis not present

## 2020-07-21 DIAGNOSIS — R0689 Other abnormalities of breathing: Secondary | ICD-10-CM | POA: Diagnosis not present

## 2020-07-21 DIAGNOSIS — I2692 Saddle embolus of pulmonary artery without acute cor pulmonale: Secondary | ICD-10-CM | POA: Diagnosis not present

## 2020-07-21 DIAGNOSIS — E782 Mixed hyperlipidemia: Secondary | ICD-10-CM | POA: Diagnosis not present

## 2020-07-21 DIAGNOSIS — Z66 Do not resuscitate: Secondary | ICD-10-CM | POA: Diagnosis not present

## 2020-07-21 DIAGNOSIS — Z8249 Family history of ischemic heart disease and other diseases of the circulatory system: Secondary | ICD-10-CM

## 2020-07-21 DIAGNOSIS — Z882 Allergy status to sulfonamides status: Secondary | ICD-10-CM | POA: Diagnosis not present

## 2020-07-21 DIAGNOSIS — I700 Atherosclerosis of aorta: Secondary | ICD-10-CM | POA: Diagnosis not present

## 2020-07-21 DIAGNOSIS — J90 Pleural effusion, not elsewhere classified: Secondary | ICD-10-CM | POA: Diagnosis not present

## 2020-07-21 DIAGNOSIS — R0602 Shortness of breath: Secondary | ICD-10-CM | POA: Diagnosis not present

## 2020-07-21 DIAGNOSIS — I82442 Acute embolism and thrombosis of left tibial vein: Secondary | ICD-10-CM | POA: Diagnosis not present

## 2020-07-21 DIAGNOSIS — R42 Dizziness and giddiness: Secondary | ICD-10-CM | POA: Diagnosis not present

## 2020-07-21 DIAGNOSIS — Z9841 Cataract extraction status, right eye: Secondary | ICD-10-CM

## 2020-07-21 DIAGNOSIS — I82432 Acute embolism and thrombosis of left popliteal vein: Secondary | ICD-10-CM | POA: Diagnosis present

## 2020-07-21 DIAGNOSIS — R279 Unspecified lack of coordination: Secondary | ICD-10-CM | POA: Diagnosis not present

## 2020-07-21 DIAGNOSIS — Z9109 Other allergy status, other than to drugs and biological substances: Secondary | ICD-10-CM

## 2020-07-21 DIAGNOSIS — M810 Age-related osteoporosis without current pathological fracture: Secondary | ICD-10-CM | POA: Diagnosis not present

## 2020-07-21 DIAGNOSIS — H4010X0 Unspecified open-angle glaucoma, stage unspecified: Secondary | ICD-10-CM | POA: Diagnosis present

## 2020-07-21 DIAGNOSIS — M1711 Unilateral primary osteoarthritis, right knee: Secondary | ICD-10-CM | POA: Diagnosis not present

## 2020-07-21 DIAGNOSIS — Z743 Need for continuous supervision: Secondary | ICD-10-CM | POA: Diagnosis not present

## 2020-07-21 DIAGNOSIS — R778 Other specified abnormalities of plasma proteins: Secondary | ICD-10-CM | POA: Diagnosis present

## 2020-07-21 DIAGNOSIS — R278 Other lack of coordination: Secondary | ICD-10-CM | POA: Diagnosis not present

## 2020-07-21 DIAGNOSIS — M170 Bilateral primary osteoarthritis of knee: Secondary | ICD-10-CM | POA: Diagnosis present

## 2020-07-21 DIAGNOSIS — U071 COVID-19: Secondary | ICD-10-CM | POA: Diagnosis present

## 2020-07-21 DIAGNOSIS — M6281 Muscle weakness (generalized): Secondary | ICD-10-CM | POA: Diagnosis not present

## 2020-07-21 DIAGNOSIS — I2602 Saddle embolus of pulmonary artery with acute cor pulmonale: Secondary | ICD-10-CM | POA: Diagnosis not present

## 2020-07-21 DIAGNOSIS — I358 Other nonrheumatic aortic valve disorders: Secondary | ICD-10-CM | POA: Diagnosis present

## 2020-07-21 DIAGNOSIS — Z90710 Acquired absence of both cervix and uterus: Secondary | ICD-10-CM

## 2020-07-21 DIAGNOSIS — R4020 Unspecified coma: Secondary | ICD-10-CM | POA: Diagnosis not present

## 2020-07-21 DIAGNOSIS — R2681 Unsteadiness on feet: Secondary | ICD-10-CM | POA: Diagnosis not present

## 2020-07-21 DIAGNOSIS — R5381 Other malaise: Secondary | ICD-10-CM | POA: Diagnosis not present

## 2020-07-21 DIAGNOSIS — R1319 Other dysphagia: Secondary | ICD-10-CM | POA: Diagnosis not present

## 2020-07-21 DIAGNOSIS — I2699 Other pulmonary embolism without acute cor pulmonale: Secondary | ICD-10-CM | POA: Diagnosis not present

## 2020-07-21 DIAGNOSIS — Z885 Allergy status to narcotic agent status: Secondary | ICD-10-CM

## 2020-07-21 DIAGNOSIS — M6259 Muscle wasting and atrophy, not elsewhere classified, multiple sites: Secondary | ICD-10-CM | POA: Diagnosis not present

## 2020-07-21 DIAGNOSIS — R55 Syncope and collapse: Secondary | ICD-10-CM

## 2020-07-21 DIAGNOSIS — Z79899 Other long term (current) drug therapy: Secondary | ICD-10-CM | POA: Diagnosis not present

## 2020-07-21 DIAGNOSIS — R0902 Hypoxemia: Secondary | ICD-10-CM | POA: Diagnosis not present

## 2020-07-21 DIAGNOSIS — R7989 Other specified abnormal findings of blood chemistry: Secondary | ICD-10-CM | POA: Diagnosis present

## 2020-07-21 DIAGNOSIS — R404 Transient alteration of awareness: Secondary | ICD-10-CM | POA: Diagnosis not present

## 2020-07-21 LAB — COMPREHENSIVE METABOLIC PANEL
ALT: 15 U/L (ref 0–44)
AST: 27 U/L (ref 15–41)
Albumin: 3.4 g/dL — ABNORMAL LOW (ref 3.5–5.0)
Alkaline Phosphatase: 66 U/L (ref 38–126)
Anion gap: 12 (ref 5–15)
BUN: 22 mg/dL (ref 8–23)
CO2: 22 mmol/L (ref 22–32)
Calcium: 8.9 mg/dL (ref 8.9–10.3)
Chloride: 102 mmol/L (ref 98–111)
Creatinine, Ser: 1.02 mg/dL — ABNORMAL HIGH (ref 0.44–1.00)
GFR calc Af Amer: 58 mL/min — ABNORMAL LOW (ref >60)
GFR calc non Af Amer: 50 mL/min — ABNORMAL LOW (ref >60)
Glucose, Bld: 182 mg/dL — ABNORMAL HIGH (ref 70–99)
Potassium: 3.8 mmol/L (ref 3.5–5.1)
Sodium: 136 mmol/L (ref 135–145)
Total Bilirubin: 0.6 mg/dL (ref 0.3–1.2)
Total Protein: 8 g/dL (ref 6.5–8.1)

## 2020-07-21 LAB — CBC WITH DIFFERENTIAL/PLATELET
Abs Immature Granulocytes: 0.16 10*3/uL — ABNORMAL HIGH (ref 0.00–0.07)
Basophils Absolute: 0 10*3/uL (ref 0.0–0.1)
Basophils Relative: 0 %
Eosinophils Absolute: 0 10*3/uL (ref 0.0–0.5)
Eosinophils Relative: 0 %
HCT: 40.3 % (ref 36.0–46.0)
Hemoglobin: 13 g/dL (ref 12.0–15.0)
Immature Granulocytes: 1 %
Lymphocytes Relative: 9 %
Lymphs Abs: 1.2 10*3/uL (ref 0.7–4.0)
MCH: 29.1 pg (ref 26.0–34.0)
MCHC: 32.3 g/dL (ref 30.0–36.0)
MCV: 90.2 fL (ref 80.0–100.0)
Monocytes Absolute: 1.4 10*3/uL — ABNORMAL HIGH (ref 0.1–1.0)
Monocytes Relative: 11 %
Neutro Abs: 10.2 10*3/uL — ABNORMAL HIGH (ref 1.7–7.7)
Neutrophils Relative %: 79 %
Platelets: 186 10*3/uL (ref 150–400)
RBC: 4.47 MIL/uL (ref 3.87–5.11)
RDW: 13.4 % (ref 11.5–15.5)
WBC: 13 10*3/uL — ABNORMAL HIGH (ref 4.0–10.5)
nRBC: 0 % (ref 0.0–0.2)

## 2020-07-21 LAB — PROCALCITONIN: Procalcitonin: <0.1 ng/mL

## 2020-07-21 LAB — D-DIMER, QUANTITATIVE: D-Dimer, Quant: >20 ug/mL-FEU — ABNORMAL HIGH (ref 0.00–0.50)

## 2020-07-21 LAB — FIBRINOGEN: Fibrinogen: 358 mg/dL (ref 210–475)

## 2020-07-21 LAB — TRIGLYCERIDES: Triglycerides: 101 mg/dL (ref <150)

## 2020-07-21 LAB — LACTATE DEHYDROGENASE: LDH: 232 U/L — ABNORMAL HIGH (ref 98–192)

## 2020-07-21 LAB — FERRITIN: Ferritin: 204 ng/mL (ref 11–307)

## 2020-07-21 LAB — LACTIC ACID, PLASMA: Lactic Acid, Venous: 1.8 mmol/L (ref 0.5–1.9)

## 2020-07-21 LAB — C-REACTIVE PROTEIN: CRP: 5.5 mg/dL — ABNORMAL HIGH (ref <1.0)

## 2020-07-21 LAB — SARS CORONAVIRUS 2 BY RT PCR (HOSPITAL ORDER, PERFORMED IN ~~LOC~~ HOSPITAL LAB): SARS Coronavirus 2: POSITIVE — AB

## 2020-07-21 MED ORDER — IOHEXOL 350 MG/ML SOLN
80.0000 mL | Freq: Once | INTRAVENOUS | Status: AC | PRN
  Administered 2020-07-21: 80 mL via INTRAVENOUS

## 2020-07-21 MED ORDER — HEPARIN BOLUS VIA INFUSION
2000.0000 [IU] | Freq: Once | INTRAVENOUS | Status: AC
  Administered 2020-07-21: 2000 [IU] via INTRAVENOUS
  Filled 2020-07-21: qty 2000

## 2020-07-21 MED ORDER — HEPARIN (PORCINE) 25000 UT/250ML-% IV SOLN
1150.0000 [IU]/h | INTRAVENOUS | Status: DC
  Administered 2020-07-21: 1150 [IU]/h via INTRAVENOUS
  Filled 2020-07-21: qty 250

## 2020-07-21 NOTE — Progress Notes (Signed)
ANTICOAGULATION CONSULT NOTE - Initial Consult  Pharmacy Consult for Heparin Indication: pulmonary embolus  Allergies  Allergen Reactions  . Codeine     "makes me feel weird"  . Meperidine Hcl     Lowers blood pressure   . Adhesive [Tape] Rash  . Sulfonamide Derivatives Rash    Patient Measurements: Height: 5\' 5"  (165.1 cm) Weight: 90.7 kg (200 lb) IBW/kg (Calculated) : 57 HEPARIN DW (KG): 77.1   Vital Signs: Temp: 97.9 F (36.6 C) (08/30 1938) Temp Source: Oral (08/30 1938) BP: 120/65 (08/30 2240) Pulse Rate: 92 (08/30 2240)  Labs: Recent Labs    07/21/20 2013  HGB 13.0  HCT 40.3  PLT 186  CREATININE 1.02*    Estimated Creatinine Clearance: 44.1 mL/min (A) (by C-G formula based on SCr of 1.02 mg/dL (H)).   Medical History: Past Medical History:  Diagnosis Date  . Arthritis    knees  . Complication of anesthesia    problem 40 yrs ago Doctor told her anesthesia drugs caused a reaction, BP drops    Medications:  Infusions:   Assessment: 86 yo COVID + F presents with worsening shortness of breath.  Chest CT + massive saddle PE with right heart strain.  Pharmacy consulted to start IV heparin.  No hx anticoagulation PTA.  Baseline CBC WNL, DDimer>20  Goal of Therapy:  Heparin level 0.3-0.7 units/ml Monitor platelets by anticoagulation protocol: Yes   Plan:  Heparin 2000 units IV bolus x1 followed by infusion at 1150 units/hr Check 8h heparin level Daily heparin level & CBC while on heparin Monitor for s/sx of bleeding  Netta Cedars PharmD, BCPS 07/21/2020,11:04 PM

## 2020-07-21 NOTE — ED Provider Notes (Signed)
[name] DEPT Provider Note   CSN: [id number] Arrival date & time: 07/21/20  1932     History Chief Complaint  Patient presents with  . Shortness of Breath  . Loss of Consciousness    Jaime [name] is a 86 y.o. female presenting for evaluation of shortness of breath and syncope.  Patient states she tested positive for Covid 1 week ago. She became short of breath today. She had a syncopal event while seated, states next thing she knew EMS was there. She denies fevers, chills, chest pain, nausea, vomiting, abd pain, urinary symptoms, normal bowel movements. States her biggest symptom of Covid has been generalized weakness. She did receive both Covid vaccines. She reports a history of osteopenia, denies any other significant medical history.  Additional history obtained from EMS. Per EMS, they were called out earlier tonight after syncopal event, but patient refused admission. An hour later, they were called to take her to the hospital due to Covid and shortness of breath.  HPI     Past Medical History:  Diagnosis Date  . Arthritis    knees  . Complication of anesthesia    problem 40 yrs ago Doctor told her anesthesia drugs caused a reaction, BP drops    Patient Active Problem List   Diagnosis Date Noted  . Acute blood loss anemia 02/28/2014  . Osteoarthritis of right knee 02/27/2014  . Total knee replacement status 02/27/2014  . HYPERLIPIDEMIA 02/13/2009  . ALLERGIC RHINITIS 02/13/2009  . PLEURAL EFFUSION, RIGHT 02/13/2009  . PULMONARY NODULE 02/13/2009    Past Surgical History:  Procedure Laterality Date  . ABDOMINAL HYSTERECTOMY    . CATARACT EXTRACTION W/PHACO Left 08/09/2018   Procedure: CATARACT EXTRACTION PHACO AND INTRAOCULAR LENS PLACEMENT ([place])  ISTENT AND ISTENT INJECT LEFT;  Surgeon: Leandrew Koyanagi, MD;  Location: [facility];  Service: Ophthalmology;  Laterality: Left;  . CHOLECYSTECTOMY    . EYE SURGERY      cataract with lens implant-right  . INSERTION OF ANTERIOR SEGMENT AQUEOUS DRAINAGE DEVICE (ISTENT) Left 08/09/2018   Procedure: INSERTION OF ANTERIOR SEGMENT AQUEOUS DRAINAGE DEVICE (ISTENT);  Surgeon: Leandrew Koyanagi, MD;  Location: [facility];  Service: Ophthalmology;  Laterality: Left;  . TOTAL KNEE ARTHROPLASTY Right 02/27/2014   Procedure: RIGHT TOTAL KNEE ARTHROPLASTY;  Surgeon: Tobi Bastos, MD;  Location: [facility];  Service: Orthopedics;  Laterality: Right;  . TUBAL LIGATION       OB History   No obstetric history on file.     History reviewed. No pertinent family history.  Social History   Tobacco Use  . Smoking status: Never Smoker  . Smokeless tobacco: Never Used  Vaping Use  . Vaping Use: Never used  Substance Use Topics  . Alcohol use: No  . Drug use: No    Home Medications Prior to Admission medications   Medication Sig Start Date End Date Taking? Authorizing Provider  ACETAMINOPHEN PO Take by mouth as needed.    [provider]  alendronate (FOSAMAX) 70 MG tablet Take 70 mg by mouth once a week. Take with a full glass of water on an empty stomach.    [provider]  aspirin 81 MG tablet Take 81 mg by mouth daily.    [provider]  bimatoprost (LUMIGAN) 0.01 % SOLN Place 1 drop into both eyes at bedtime.    [provider]  buPROPion (WELLBUTRIN XL) 150 MG 24 hr tablet Take 150 mg by mouth daily.  [provider]  Calcium Carb-Cholecalciferol (CALCIUM 600 + D PO) Take by mouth daily.    [provider]  dorzolamide-timolol (COSOPT) 22.3-6.8 MG/ML ophthalmic solution Place 1 drop into both eyes 2 (two) times daily.    [provider]  fexofenadine (ALLEGRA) 180 MG tablet Take 180 mg by mouth daily.    [provider]  fluticasone (FLONASE) 50 MCG/ACT nasal spray Place 1 spray into both nostrils daily as needed for allergies or rhinitis.    [provider]  hyoscyamine  (ANASPAZ) 0.125 MG TBDP disintergrating tablet Place 0.125 mg under the tongue every 4 (four) hours as needed for cramping.    [provider]  Multiple Vitamins-Minerals (CENTRUM SILVER PO) Take by mouth daily.    [provider]  Peppermint Oil (IBGARD PO) Take by mouth as needed.    [provider]  rosuvastatin (CRESTOR) 10 MG tablet Take 5 mg by mouth daily.    [provider]  Vitamin D, Cholecalciferol, 400 units TABS Take by mouth daily.    [provider]    Allergies    Codeine, Meperidine hcl, Adhesive [tape], and Sulfonamide derivatives  Review of Systems   Review of Systems  Respiratory: Positive for shortness of breath.   Neurological: Positive for syncope and weakness.  All other systems reviewed and are negative.   Physical Exam Updated Vital Signs BP (!) 148/69   Pulse 92   Temp 97.9 F (36.6 C) (Oral)   Resp (!) 31   Ht 5\' 5"  (1.651 m)   Wt 90.7 kg   SpO2 100%   BMI 33.28 kg/m   Physical Exam Vitals and nursing note reviewed.  Constitutional:      General: She is not in acute distress.    Appearance: She is well-developed. She is ill-appearing.     Comments: Appears ill  HENT:     Head: Normocephalic and atraumatic.  Eyes:     Extraocular Movements: Extraocular movements intact.     Conjunctiva/sclera: Conjunctivae normal.     Pupils: Pupils are equal, round, and reactive to light.  Cardiovascular:     Rate and Rhythm: Normal rate and regular rhythm.     Pulses: Normal pulses.  Pulmonary:     Effort: Pulmonary effort is normal. Tachypnea present.     Breath sounds: Normal breath sounds. No wheezing.     Comments: Tachypneic around 35. Speaking in short sentences. Sats greater than 95 on 3 L via nasal cannula. Abdominal:     General: There is no distension.     Palpations: Abdomen is soft. There is no mass.     Tenderness: There is no abdominal tenderness. There is no guarding or rebound.    Musculoskeletal:        General: Normal range of motion.     Cervical back: Normal range of motion and neck supple.     Left lower leg: Edema present.     Comments: L leg swelling, baseline per pt. No ttp  Skin:    General: Skin is warm and dry.     Capillary Refill: Capillary refill takes less than 2 seconds.  Neurological:     Mental Status: She is alert and oriented to person, place, and time.     ED Results / Procedures / Treatments   Labs (all labs ordered are listed, but only abnormal results are displayed) Labs Reviewed  SARS CORONAVIRUS 2 BY RT PCR ([place]) -  Abnormal; Notable for the following components:      Result Value   SARS Coronavirus 2 POSITIVE (*)    All other components within normal limits  CBC WITH DIFFERENTIAL/PLATELET - Abnormal; Notable for the following components:   WBC 13.0 (*)    Neutro Abs 10.2 (*)    Monocytes Absolute 1.4 (*)    Abs Immature Granulocytes 0.16 (*)    All other components within normal limits  COMPREHENSIVE METABOLIC PANEL - Abnormal; Notable for the following components:   Glucose, Bld 182 (*)    Creatinine, Ser 1.02 (*)    Albumin 3.4 (*)    GFR calc non Af Amer 50 (*)    GFR calc Af Amer 58 (*)    All other components within normal limits  D-DIMER, QUANTITATIVE (NOT AT [facility]) - Abnormal; Notable for the following components:   D-Dimer, Quant >20.00 (*)    All other components within normal limits  LACTATE DEHYDROGENASE - Abnormal; Notable for the following components:   LDH 232 (*)    All other components within normal limits  C-REACTIVE PROTEIN - Abnormal; Notable for the following components:   CRP 5.5 (*)    All other components within normal limits  CULTURE, BLOOD (ROUTINE X 2)  CULTURE, BLOOD (ROUTINE X 2)  LACTIC ACID, PLASMA  PROCALCITONIN  FERRITIN  TRIGLYCERIDES  FIBRINOGEN    EKG EKG Interpretation  Date/Time:  Monday July 21 2020 19:45:45  EDT Ventricular Rate:  95 PR Interval:    QRS Duration: 101 QT Interval:  368 QTC Calculation: 463 R Axis:   53 Text Interpretation: Sinus rhythm Minimal ST depression, inferior leads No significant change since prior 5/12 Confirmed by Aletta Edouard [phone]) on 07/21/2020 7:53:51 PM   Radiology CT Angio Chest PE W and/or Wo Contrast  Result Date: 07/21/2020 CLINICAL DATA:  COVID-19 positive, syncopal episode, hypoxic on room air EXAM: CT ANGIOGRAPHY CHEST WITH CONTRAST TECHNIQUE: Multidetector CT imaging of the chest was performed using the standard protocol during bolus administration of intravenous contrast. Multiplanar CT image reconstructions and MIPs were obtained to evaluate the vascular anatomy. CONTRAST:  10mL OMNIPAQUE IOHEXOL 350 MG/ML SOLN COMPARISON:  Chest radiograph 04/23/2016 FINDINGS: Cardiovascular: Satisfactory opacification of the pulmonary arteries. Large saddle embolus is seen in the distal pulmonary trunk. Filling defect seen extending into the left and right main pulmonary arteries as well as throughout the lobar and segmental pulmonary arterial branches of the left upper lobe, lingula and lower lobe. On the right, filling defect is seen predominantly within the right upper lobar, right interlobar pulmonary arteries and segmental pulmonary arteries of the right upper middle and lower lobes. Central pulmonary arteries are borderline enlarged. Marked elevation of the RV/LV ratio to 4.1. Flattening of the interventricular septum is noted. Reflux of contrast is also noted into the IVC and hepatic veins. Cardiac size remains within normal limits. Few coronary artery calcifications are present. Atherosclerotic plaque within the normal caliber aorta. No acute aortic luminal abnormality. No periaortic stranding or hemorrhage. Normal 3 vessel branching of the aortic arch with minimal plaque in the proximal great vessels. Proximal great vessels are otherwise unremarkable. Mediastinum/Nodes:  Extensive partially calcified mediastinal and hilar adenopathy seen bilaterally. No concerning mediastinal, hilar or axillary nodes. No acute abnormality of the trachea. Sliding-type hiatal hernia. Thoracic esophagus otherwise unremarkable. The thyroid gland and thoracic inlet are unremarkable. Lungs/Pleura: Extensive respiratory motion artifact limits evaluation of the lung parenchyma. There is a small right pleural effusion and some passive atelectatic changes  in the right lung base. Diffuse mild airways thickening and scattered secretions are noted including some thickened and fluid-filled airways in the medial right lung base. Question some small tree-in-bud opacities present in the periphery of the right upper lobe, medial right lower lobe into a lesser extent the lingula as well. No pneumothorax. Upper Abdomen: No acute abnormalities present in the visualized portions of the upper abdomen. Musculoskeletal: No acute osseous abnormality or suspicious osseous lesion. Multilevel degenerative changes are present in the imaged portions of the spine. Review of the MIP images confirms the above findings. IMPRESSION: 1. Extensive bilateral pulmonary emboli with a saddle embolus in the distal pulmonary trunk, extensive bilateral lobar and segmental pulmonary arterial branches throughout the lungs. Positive for acute PE with CT evidence of right heart strain (RV/LV Ratio = 4.1) consistent with at least submassive (intermediate risk) PE. The presence of right heart strain has been associated with an increased risk of morbidity and mortality. Small right pleural effusion and some passive atelectatic changes in the right lung base. 2. Diffuse mild airways thickening and scattered secretions including some thickened and fluid-filled airways in the medial right lung base. Question some small tree-in-bud opacities in the periphery of the right upper lobe, medial right lower lobe into a lesser extent the lingula as well.  Findings could reflect an acute infectious or inflammatory process, particularly in the setting of COVID-19 positivity. 3. Extensive partially calcified mediastinal and hilar adenopathy, may be seen as sequela of remote granulomatous disease. 4. Sliding-type hiatal hernia. 5. Aortic Atherosclerosis (ICD10-I70.0). Critical Value/emergent results were called by telephone at the time of interpretation on 07/21/2020 at 11:05 pm to provider [facility] , who verbally acknowledged these results. Electronically Signed   By: Lovena Le M.D.   On: 07/21/2020 23:06    Procedures .Critical Care Performed by: [name], PA-C Authorized by: [name], PA-C   Critical care provider statement:    Critical care time (minutes):  45   Critical care time was exclusive of:  Separately billable procedures and treating other patients and teaching time   Critical care was necessary to treat or prevent imminent or life-threatening deterioration of the following conditions:  Respiratory failure   Critical care was time spent personally by me on the following activities:  Blood draw for specimens, development of treatment plan with patient or surrogate, discussions with consultants, evaluation of patient's response to treatment, examination of patient, obtaining history from patient or surrogate, ordering and performing treatments and interventions, ordering and review of laboratory studies, ordering and review of radiographic studies, pulse oximetry, re-evaluation of patient's condition and review of old charts   I assumed direction of critical care for this patient from another provider in my specialty: no   Comments:     Pt with hypoxia due to large PE requiring heparin and admission   (including critical care time)  Medications Ordered in ED Medications  heparin bolus via infusion 2,000 Units (2,000 Units Intravenous Bolus from Bag 07/21/20 2325)    Followed by  heparin ADULT infusion 100  units/mL (25000 units/258mL sodium chloride 0.45%) (1,150 Units/hr Intravenous New Bag/Given 07/21/20 2327)  iohexol (OMNIPAQUE) 350 MG/ML injection 80 mL (80 mLs Intravenous Contrast Given 07/21/20 2253)    ED Course  I have reviewed the triage vital signs and the nursing notes.  Pertinent labs & imaging results that were available during my care of the patient were reviewed by me and considered in my medical decision making (see chart for details).  Clinical Course as of Jul 21 2336  Mon Jul 22, 6863  2662 86 year old female known Covid positive here after syncopal events not eating and drinking much sats 89% on room air.  She is in no distress.  Will need admission to the hospital for further work-up.  Getting screening labs.   [MB]    Clinical Course User Index [MB] Hayden Rasmussen, MD   MDM Rules/Calculators/A&P                          Patient presenting for evaluation of worsening shortness of breath and syncope in the setting of a Covid infection. On exam, patient is tachypneic, speaking short sentences. She is requiring oxygen with room air sat of 89% per EMS. She likely need to be admitted for hypoxia, however due to acute worsening shortness of breath today, concern for PE. Will obtain labs and CTA prior to admission. Case discussed with attending, Dr. Melina Copa evaluated the patient.  Labs consistent with Covid.  Patient has a mild leukocytosis of 13, however without fever for suspicion for superimposed infection.  Dimer elevated greater than 20, higher concern for PE.  CTA pending.  CTA shows massive saddle PE with a heart strain and a ratio 4.1. PESI score 126. Will consult with critical care.  Pt signed out to Andris Baumann, PA-C for discussion with critical care and admission.    Final Clinical Impression(s) / ED Diagnoses Final diagnoses:  Acute saddle pulmonary embolism with acute cor pulmonale (HCC)  COVID-19  Syncope, unspecified syncope type    Rx / DC Orders ED  Discharge Orders    None       [name], PA-C 07/21/20 2337    Hayden Rasmussen, MD 07/22/20 1047

## 2020-07-21 NOTE — ED Triage Notes (Signed)
Pt arrived from home via EMS. Pt is covid positive. Pt had a syncopal episode. Pt was 89% on room air. Pt tested positive for covid a week ago. Pt had a near syncopal episode when EMS had her stand up. Chest x ray from a week ago was clear.

## 2020-07-22 ENCOUNTER — Inpatient Hospital Stay (HOSPITAL_COMMUNITY): Payer: Medicare Other

## 2020-07-22 ENCOUNTER — Encounter (HOSPITAL_COMMUNITY): Payer: Self-pay | Admitting: Internal Medicine

## 2020-07-22 ENCOUNTER — Other Ambulatory Visit (HOSPITAL_COMMUNITY): Payer: Self-pay | Admitting: Oncology

## 2020-07-22 DIAGNOSIS — J9601 Acute respiratory failure with hypoxia: Secondary | ICD-10-CM | POA: Diagnosis present

## 2020-07-22 DIAGNOSIS — I2602 Saddle embolus of pulmonary artery with acute cor pulmonale: Secondary | ICD-10-CM | POA: Diagnosis present

## 2020-07-22 DIAGNOSIS — Z885 Allergy status to narcotic agent status: Secondary | ICD-10-CM | POA: Diagnosis not present

## 2020-07-22 DIAGNOSIS — U071 COVID-19: Secondary | ICD-10-CM | POA: Diagnosis present

## 2020-07-22 DIAGNOSIS — I358 Other nonrheumatic aortic valve disorders: Secondary | ICD-10-CM | POA: Diagnosis present

## 2020-07-22 DIAGNOSIS — I82432 Acute embolism and thrombosis of left popliteal vein: Secondary | ICD-10-CM | POA: Diagnosis present

## 2020-07-22 DIAGNOSIS — I2699 Other pulmonary embolism without acute cor pulmonale: Secondary | ICD-10-CM | POA: Diagnosis not present

## 2020-07-22 DIAGNOSIS — Z9109 Other allergy status, other than to drugs and biological substances: Secondary | ICD-10-CM | POA: Diagnosis not present

## 2020-07-22 DIAGNOSIS — H4010X0 Unspecified open-angle glaucoma, stage unspecified: Secondary | ICD-10-CM | POA: Diagnosis present

## 2020-07-22 DIAGNOSIS — R5381 Other malaise: Secondary | ICD-10-CM | POA: Diagnosis not present

## 2020-07-22 DIAGNOSIS — E782 Mixed hyperlipidemia: Secondary | ICD-10-CM | POA: Diagnosis present

## 2020-07-22 DIAGNOSIS — M810 Age-related osteoporosis without current pathological fracture: Secondary | ICD-10-CM | POA: Diagnosis present

## 2020-07-22 DIAGNOSIS — Z90710 Acquired absence of both cervix and uterus: Secondary | ICD-10-CM | POA: Diagnosis not present

## 2020-07-22 DIAGNOSIS — R778 Other specified abnormalities of plasma proteins: Secondary | ICD-10-CM | POA: Diagnosis present

## 2020-07-22 DIAGNOSIS — R279 Unspecified lack of coordination: Secondary | ICD-10-CM | POA: Diagnosis not present

## 2020-07-22 DIAGNOSIS — I82442 Acute embolism and thrombosis of left tibial vein: Secondary | ICD-10-CM | POA: Diagnosis present

## 2020-07-22 DIAGNOSIS — Z961 Presence of intraocular lens: Secondary | ICD-10-CM | POA: Diagnosis present

## 2020-07-22 DIAGNOSIS — Z743 Need for continuous supervision: Secondary | ICD-10-CM | POA: Diagnosis not present

## 2020-07-22 DIAGNOSIS — M6259 Muscle wasting and atrophy, not elsewhere classified, multiple sites: Secondary | ICD-10-CM | POA: Diagnosis not present

## 2020-07-22 DIAGNOSIS — M6281 Muscle weakness (generalized): Secondary | ICD-10-CM | POA: Diagnosis not present

## 2020-07-22 DIAGNOSIS — R278 Other lack of coordination: Secondary | ICD-10-CM | POA: Diagnosis not present

## 2020-07-22 DIAGNOSIS — Z79899 Other long term (current) drug therapy: Secondary | ICD-10-CM | POA: Diagnosis not present

## 2020-07-22 DIAGNOSIS — R2681 Unsteadiness on feet: Secondary | ICD-10-CM | POA: Diagnosis not present

## 2020-07-22 DIAGNOSIS — I2692 Saddle embolus of pulmonary artery without acute cor pulmonale: Secondary | ICD-10-CM

## 2020-07-22 DIAGNOSIS — J1282 Pneumonia due to coronavirus disease 2019: Secondary | ICD-10-CM | POA: Diagnosis present

## 2020-07-22 DIAGNOSIS — Z8249 Family history of ischemic heart disease and other diseases of the circulatory system: Secondary | ICD-10-CM | POA: Diagnosis not present

## 2020-07-22 DIAGNOSIS — Z882 Allergy status to sulfonamides status: Secondary | ICD-10-CM | POA: Diagnosis not present

## 2020-07-22 DIAGNOSIS — Z66 Do not resuscitate: Secondary | ICD-10-CM | POA: Diagnosis present

## 2020-07-22 DIAGNOSIS — Z9842 Cataract extraction status, left eye: Secondary | ICD-10-CM | POA: Diagnosis not present

## 2020-07-22 DIAGNOSIS — Z9841 Cataract extraction status, right eye: Secondary | ICD-10-CM | POA: Diagnosis not present

## 2020-07-22 DIAGNOSIS — M170 Bilateral primary osteoarthritis of knee: Secondary | ICD-10-CM | POA: Diagnosis present

## 2020-07-22 DIAGNOSIS — R1319 Other dysphagia: Secondary | ICD-10-CM | POA: Diagnosis not present

## 2020-07-22 DIAGNOSIS — R0602 Shortness of breath: Secondary | ICD-10-CM | POA: Diagnosis not present

## 2020-07-22 DIAGNOSIS — M1711 Unilateral primary osteoarthritis, right knee: Secondary | ICD-10-CM | POA: Diagnosis not present

## 2020-07-22 LAB — CBC WITH DIFFERENTIAL/PLATELET
Abs Immature Granulocytes: 0.16 10*3/uL — ABNORMAL HIGH (ref 0.00–0.07)
Basophils Absolute: 0 10*3/uL (ref 0.0–0.1)
Basophils Relative: 0 %
Eosinophils Absolute: 0 10*3/uL (ref 0.0–0.5)
Eosinophils Relative: 0 %
HCT: 39.3 % (ref 36.0–46.0)
Hemoglobin: 12.6 g/dL (ref 12.0–15.0)
Immature Granulocytes: 1 %
Lymphocytes Relative: 8 %
Lymphs Abs: 1 10*3/uL (ref 0.7–4.0)
MCH: 29 pg (ref 26.0–34.0)
MCHC: 32.1 g/dL (ref 30.0–36.0)
MCV: 90.3 fL (ref 80.0–100.0)
Monocytes Absolute: 1 10*3/uL (ref 0.1–1.0)
Monocytes Relative: 8 %
Neutro Abs: 10.6 10*3/uL — ABNORMAL HIGH (ref 1.7–7.7)
Neutrophils Relative %: 83 %
Platelets: 175 10*3/uL (ref 150–400)
RBC: 4.35 MIL/uL (ref 3.87–5.11)
RDW: 13.6 % (ref 11.5–15.5)
WBC: 12.7 10*3/uL — ABNORMAL HIGH (ref 4.0–10.5)
nRBC: 0 % (ref 0.0–0.2)

## 2020-07-22 LAB — URINALYSIS, COMPLETE (UACMP) WITH MICROSCOPIC
Bilirubin Urine: NEGATIVE
Glucose, UA: NEGATIVE mg/dL
Hgb urine dipstick: NEGATIVE
Ketones, ur: 5 mg/dL — AB
Leukocytes,Ua: NEGATIVE
Nitrite: NEGATIVE
Protein, ur: 30 mg/dL — AB
Specific Gravity, Urine: 1.01 (ref 1.005–1.030)
pH: 5 (ref 5.0–8.0)

## 2020-07-22 LAB — C-REACTIVE PROTEIN: CRP: 4.8 mg/dL — ABNORMAL HIGH (ref <1.0)

## 2020-07-22 LAB — CBC
HCT: 39.3 % (ref 36.0–46.0)
Hemoglobin: 12.5 g/dL (ref 12.0–15.0)
MCH: 28.6 pg (ref 26.0–34.0)
MCHC: 31.8 g/dL (ref 30.0–36.0)
MCV: 89.9 fL (ref 80.0–100.0)
Platelets: 183 10*3/uL (ref 150–400)
RBC: 4.37 MIL/uL (ref 3.87–5.11)
RDW: 13.6 % (ref 11.5–15.5)
WBC: 12.9 10*3/uL — ABNORMAL HIGH (ref 4.0–10.5)
nRBC: 0 % (ref 0.0–0.2)

## 2020-07-22 LAB — TROPONIN I (HIGH SENSITIVITY)
Troponin I (High Sensitivity): 1617 ng/L (ref <18)
Troponin I (High Sensitivity): 1076 ng/L (ref <18)
Troponin I (High Sensitivity): 962 ng/L (ref <18)

## 2020-07-22 LAB — ECHOCARDIOGRAM COMPLETE
Area-P 1/2: 3.72 cm2
Height: 65 in
P 1/2 time: 548 msec
S' Lateral: 3.3 cm
Weight: 3200 oz

## 2020-07-22 LAB — PROTIME-INR
INR: 1.3 — ABNORMAL HIGH (ref 0.8–1.2)
Prothrombin Time: 15.8 seconds — ABNORMAL HIGH (ref 11.4–15.2)

## 2020-07-22 LAB — D-DIMER, QUANTITATIVE: D-Dimer, Quant: >20 ug/mL-FEU — ABNORMAL HIGH (ref 0.00–0.50)

## 2020-07-22 LAB — PHOSPHORUS: Phosphorus: 3.1 mg/dL (ref 2.5–4.6)

## 2020-07-22 LAB — HEPARIN LEVEL (UNFRACTIONATED)
Heparin Unfractionated: 0.25 IU/mL — ABNORMAL LOW (ref 0.30–0.70)
Heparin Unfractionated: 0.64 IU/mL (ref 0.30–0.70)

## 2020-07-22 LAB — HEMOGLOBIN A1C
Hgb A1c MFr Bld: 6 % — ABNORMAL HIGH (ref 4.8–5.6)
Mean Plasma Glucose: 125.5 mg/dL

## 2020-07-22 LAB — LIPASE, BLOOD: Lipase: 28 U/L (ref 11–51)

## 2020-07-22 LAB — BRAIN NATRIURETIC PEPTIDE: B Natriuretic Peptide: 340.9 pg/mL — ABNORMAL HIGH (ref 0.0–100.0)

## 2020-07-22 LAB — ABO/RH: ABO/RH(D): O NEG

## 2020-07-22 LAB — APTT: aPTT: 186 seconds (ref 24–36)

## 2020-07-22 LAB — MAGNESIUM: Magnesium: 1.8 mg/dL (ref 1.7–2.4)

## 2020-07-22 MED ORDER — ALBUTEROL SULFATE HFA 108 (90 BASE) MCG/ACT IN AERS
2.0000 | INHALATION_SPRAY | RESPIRATORY_TRACT | Status: DC | PRN
  Administered 2020-07-23 – 2020-07-25 (×3): 2 via RESPIRATORY_TRACT

## 2020-07-22 MED ORDER — ONDANSETRON HCL 4 MG/2ML IJ SOLN
4.0000 mg | Freq: Four times a day (QID) | INTRAMUSCULAR | Status: DC | PRN
  Administered 2020-07-26: 4 mg via INTRAVENOUS
  Filled 2020-07-22: qty 2

## 2020-07-22 MED ORDER — LATANOPROST 0.005 % OP SOLN
1.0000 [drp] | Freq: Every day | OPHTHALMIC | Status: DC
  Administered 2020-07-23 – 2020-07-28 (×7): 1 [drp] via OPHTHALMIC
  Filled 2020-07-22: qty 2.5

## 2020-07-22 MED ORDER — ZINC SULFATE 220 (50 ZN) MG PO CAPS
220.0000 mg | ORAL_CAPSULE | Freq: Every day | ORAL | Status: DC
  Administered 2020-07-22 – 2020-07-29 (×8): 220 mg via ORAL
  Filled 2020-07-22 (×7): qty 1

## 2020-07-22 MED ORDER — ACETAMINOPHEN 325 MG PO TABS
650.0000 mg | ORAL_TABLET | Freq: Four times a day (QID) | ORAL | Status: DC | PRN
  Administered 2020-07-22: 650 mg via ORAL
  Filled 2020-07-22: qty 2

## 2020-07-22 MED ORDER — POLYETHYLENE GLYCOL 3350 17 G PO PACK
17.0000 g | PACK | Freq: Every day | ORAL | Status: DC | PRN
  Administered 2020-07-28: 17 g via ORAL
  Filled 2020-07-22: qty 1

## 2020-07-22 MED ORDER — ASCORBIC ACID 500 MG PO TABS
500.0000 mg | ORAL_TABLET | Freq: Every day | ORAL | Status: DC
  Administered 2020-07-22 – 2020-07-29 (×8): 500 mg via ORAL
  Filled 2020-07-22 (×7): qty 1

## 2020-07-22 MED ORDER — SODIUM CHLORIDE 0.9 % IV SOLN
200.0000 mg | Freq: Once | INTRAVENOUS | Status: AC
  Administered 2020-07-22: 200 mg via INTRAVENOUS
  Filled 2020-07-22: qty 200

## 2020-07-22 MED ORDER — DEXAMETHASONE SODIUM PHOSPHATE 10 MG/ML IJ SOLN
6.0000 mg | INTRAMUSCULAR | Status: DC
  Administered 2020-07-23 – 2020-07-27 (×5): 6 mg via INTRAVENOUS
  Filled 2020-07-22 (×5): qty 1

## 2020-07-22 MED ORDER — HEPARIN (PORCINE) 25000 UT/250ML-% IV SOLN
1150.0000 [IU]/h | INTRAVENOUS | Status: DC
  Administered 2020-07-22: 1150 [IU]/h via INTRAVENOUS
  Filled 2020-07-22: qty 250

## 2020-07-22 MED ORDER — LORATADINE 10 MG PO TABS
10.0000 mg | ORAL_TABLET | Freq: Every day | ORAL | Status: DC
  Administered 2020-07-22 – 2020-07-29 (×8): 10 mg via ORAL
  Filled 2020-07-22 (×8): qty 1

## 2020-07-22 MED ORDER — ONDANSETRON HCL 4 MG PO TABS: 4.0000 mg | ORAL_TABLET | Freq: Four times a day (QID) | ORAL | Status: DC | PRN

## 2020-07-22 MED ORDER — GUAIFENESIN-DM 100-10 MG/5ML PO SYRP: 10.0000 mL | ORAL_SOLUTION | ORAL | Status: DC | PRN

## 2020-07-22 MED ORDER — ROSUVASTATIN CALCIUM 5 MG PO TABS
2.5000 mg | ORAL_TABLET | Freq: Every day | ORAL | Status: DC
  Administered 2020-07-22 – 2020-07-29 (×8): 2.5 mg via ORAL
  Filled 2020-07-22: qty 0.5
  Filled 2020-07-22 (×4): qty 1
  Filled 2020-07-22: qty 0.5
  Filled 2020-07-22 (×2): qty 1

## 2020-07-22 MED ORDER — SODIUM CHLORIDE 0.9 % IV SOLN
100.0000 mg | Freq: Every day | INTRAVENOUS | Status: AC
  Administered 2020-07-23 – 2020-07-26 (×4): 100 mg via INTRAVENOUS
  Filled 2020-07-22 (×5): qty 20

## 2020-07-22 MED ORDER — ALBUTEROL SULFATE HFA 108 (90 BASE) MCG/ACT IN AERS
2.0000 | INHALATION_SPRAY | Freq: Four times a day (QID) | RESPIRATORY_TRACT | Status: DC
  Administered 2020-07-22 – 2020-07-28 (×26): 2 via RESPIRATORY_TRACT
  Filled 2020-07-22: qty 6.7

## 2020-07-22 MED ORDER — LACTATED RINGERS IV SOLN: INTRAVENOUS | Status: AC

## 2020-07-22 MED ORDER — DEXAMETHASONE SODIUM PHOSPHATE 10 MG/ML IJ SOLN
6.0000 mg | Freq: Once | INTRAMUSCULAR | Status: AC
  Administered 2020-07-22: 6 mg via INTRAVENOUS
  Filled 2020-07-22: qty 1

## 2020-07-22 MED ORDER — FLUTICASONE PROPIONATE 50 MCG/ACT NA SUSP: 1.0000 | Freq: Every day | NASAL | Status: DC | PRN

## 2020-07-22 MED ORDER — PERFLUTREN LIPID MICROSPHERE
1.0000 mL | INTRAVENOUS | Status: AC | PRN
  Administered 2020-07-22: 3 mL via INTRAVENOUS
  Filled 2020-07-22: qty 10

## 2020-07-22 MED ORDER — DORZOLAMIDE HCL-TIMOLOL MAL 2-0.5 % OP SOLN
1.0000 [drp] | Freq: Two times a day (BID) | OPHTHALMIC | Status: DC
  Administered 2020-07-22 – 2020-07-29 (×15): 1 [drp] via OPHTHALMIC
  Filled 2020-07-22: qty 10

## 2020-07-22 NOTE — ED Notes (Signed)
Second troponin needed at 10:42AM per MD

## 2020-07-22 NOTE — ED Notes (Signed)
Date and time results received: 07/22/20 5:49 AM  (use smartphrase ".now" to insert current time)  Test: troponin Critical Value: 1617  Name of Provider Notified: Inda Merlin, MD  Orders Received? Or Actions Taken?:

## 2020-07-22 NOTE — Progress Notes (Signed)
Patient's daughter, Baker Janus, would like call for provider update, to discuss prognosis and treatment.at [phone]

## 2020-07-22 NOTE — Progress Notes (Signed)
ANTICOAGULATION CONSULT NOTE - follow up  Pharmacy Consult for Heparin Indication: pulmonary embolus  Allergies  Allergen Reactions  . Codeine     "makes me feel weird"  . Meperidine Hcl     Lowers blood pressure   . Adhesive [Tape] Rash  . Sulfonamide Derivatives Rash    Patient Measurements: Height: 5\' 5"  (165.1 cm) Weight: 90.7 kg (200 lb) IBW/kg (Calculated) : 57 HEPARIN DW (KG): 77.1   Vital Signs: Temp: 97.9 F (36.6 C) (08/31 0730) Temp Source: Oral (08/31 0730) BP: 143/83 (08/31 0830) Pulse Rate: 85 (08/31 0830)  Labs: Recent Labs    07/21/20 2013 07/21/20 2013 07/22/20 0442 07/22/20 0850  HGB 13.0   < > 12.6 12.5  HCT 40.3  --  39.3 39.3  PLT 186  --  175 183  APTT  --   --  186*  --   LABPROT  --   --  15.8*  --   INR  --   --  1.3*  --   HEPARINUNFRC  --   --   --  0.25*  CREATININE 1.02*  --   --   --   TROPONINIHS  --   --  1,617*  --    < > = values in this interval not displayed.    Estimated Creatinine Clearance: 44.1 mL/min (A) (by C-G formula based on SCr of 1.02 mg/dL (H)).   Medical History: Past Medical History:  Diagnosis Date  . Arthritis    knees  . Complication of anesthesia    problem 40 yrs ago Doctor told her anesthesia drugs caused a reaction, BP drops    Medications:  Infusions:   Assessment: 86 yo COVID + F presents with worsening shortness of breath.  Chest CT + massive saddle PE with right heart strain.  Pharmacy consulted to start IV heparin.  No hx anticoagulation PTA. Baseline CBC WNL, DDimer>20. Of note, heparin infusion was stopped this AM around at 0600. I see no legitimate reason it was stopped, admitting Md wants heparin restarted. Heparin level still 0.25 drawn at 0850, despite heparin being off since about 0600 as mentioned above.   Goal of Therapy:  Heparin level 0.3-0.7 units/ml Monitor platelets by anticoagulation protocol: Yes   Plan:   Due to heparin level still being 0.25 despite being off for 3  hours, will not re-bolus at this time  Restart IV heparin at previous rate ordered 1150 units/hr  Check 8h heparin level  Daily heparin level & CBC while on heparin  Monitor for s/sx of bleeding   Kara Mead PharmD, BCPS 07/22/2020,9:10 AM

## 2020-07-22 NOTE — H&P (Addendum)
History and Physical    Jaime [name] DJT:[id number] DOB: [birth year] DOA: 07/21/2020  PCP: Crist Infante, MD  Patient coming from: Home   Chief Complaint:  Chief Complaint  Patient presents with  . Shortness of Breath  . Loss of Consciousness     HPI:    86 year old female with past medical history of osteoporosis, hyperlipidemia, open-angle glaucoma who presents to [facility] emergency department with complaints of shortness of breath.  Patient explains that approximately 2 weeks ago she began to develop generalized malaise and weakness with muscle aches.  Patient symptoms worsened over the next several days and became associated with poor oral intake.  Patient did repeatedly develop fevers.  Patient reports that as her symptoms persisted she lost her appetite.   Of note, patient underwent COVID-19 vaccination in early 2021.    Patient symptoms progressively worsened until the patient eventually presented to a local urgent care clinic approximately 1 week ago where she was tested for COVID-19 and came back positive.  Patient went home to self quarantine and manage her symptoms conservatively.  Over half okay Supples car, she around abdominal pain scan abdomen and pelvis BMP on heart failure  Approximately 1 to 2 days prior to presentation, the patient suddenly began rapid onset shortness of breath associated with dry cough.  Patient denies any associated leg pain.  Patient does complain of bilateral lower extremity swelling but feels that she has had leg swelling for a long time.  Patient denies associated chest pain.  Patient eventually presented to [facility] emergency department for evaluation where she was found to be hypoxic.  COVID-19 PCR testing remained positive.  CT angiogram of the chest revealed bilateral pulmonary emboli with evidence of saddle embolism and right heart strain thought to be secondary to COVID-19 infection.  Case was discussed with Dr. Lucile Shutters with  critical care medicine who felt that it would be appropriate for medicine to admit the patient to the stepdown unit with recommendation to obtain cardiac echocardiography the next morning.  Patient was initiated on intravenous heparin infusion.  Hospitalist group was then called to assess the patient for admission to the hospital.   Review of Systems: Review of Systems  Constitutional: Positive for fever and malaise/fatigue.  Respiratory: Positive for cough and shortness of breath.   Gastrointestinal: Positive for diarrhea.  Musculoskeletal: Positive for myalgias.  Neurological: Positive for weakness.  All other systems reviewed and are negative.     Past Medical History:  Diagnosis Date  . Arthritis    knees  . Complication of anesthesia    problem 40 yrs ago Doctor told her anesthesia drugs caused a reaction, BP drops    Past Surgical History:  Procedure Laterality Date  . ABDOMINAL HYSTERECTOMY    . CATARACT EXTRACTION W/PHACO Left 08/09/2018   Procedure: CATARACT EXTRACTION PHACO AND INTRAOCULAR LENS PLACEMENT ([place])  ISTENT AND ISTENT INJECT LEFT;  Surgeon: Leandrew Koyanagi, MD;  Location: [place];  Service: Ophthalmology;  Laterality: Left;  . CHOLECYSTECTOMY    . EYE SURGERY     cataract with lens implant-right  . INSERTION OF ANTERIOR SEGMENT AQUEOUS DRAINAGE DEVICE (ISTENT) Left 08/09/2018   Procedure: INSERTION OF ANTERIOR SEGMENT AQUEOUS DRAINAGE DEVICE (ISTENT);  Surgeon: Leandrew Koyanagi, MD;  Location: [place];  Service: Ophthalmology;  Laterality: Left;  . TOTAL KNEE ARTHROPLASTY Right 02/27/2014   Procedure: RIGHT TOTAL KNEE ARTHROPLASTY;  Surgeon: Tobi Bastos, MD;  Location: [facility];  Service: Orthopedics;  Laterality:  Right;  Marland Kitchen TUBAL LIGATION       reports that she has never smoked. She has never used smokeless tobacco. She reports that she does not drink alcohol and does not use drugs.  Allergies  Allergen Reactions  . Codeine      "makes me feel weird"  . Meperidine Hcl     Lowers blood pressure   . Adhesive [Tape] Rash  . Sulfonamide Derivatives Rash    Family History  Problem Relation Age of Onset  . Ovarian cancer Mother   . Heart attack Father      Prior to Admission medications   Medication Sig Start Date End Date Taking? Authorizing Provider  alendronate (FOSAMAX) 70 MG tablet Take 70 mg by mouth once a week. Take with a full glass of water on an empty stomach.   Yes [provider]  bimatoprost (LUMIGAN) 0.01 % SOLN Place 1 drop into both eyes at bedtime.   Yes [provider]  Calcium Carb-Cholecalciferol (CALCIUM 600 + D PO) Take 1 tablet by mouth daily.    Yes [provider]  dorzolamide-timolol (COSOPT) 22.3-6.8 MG/ML ophthalmic solution Place 1 drop into both eyes 2 (two) times daily.   Yes [provider]  fexofenadine (ALLEGRA) 180 MG tablet Take 180 mg by mouth daily.   Yes [provider]  fluticasone (FLONASE) 50 MCG/ACT nasal spray Place 1 spray into both nostrils daily as needed for allergies or rhinitis.   Yes [provider]  Multiple Vitamins-Minerals (CENTRUM SILVER PO) Take 1 tablet by mouth daily.    Yes [provider]  rosuvastatin (CRESTOR) 10 MG tablet Take 5 mg by mouth daily.   Yes [provider]  Vitamin D, Cholecalciferol, 400 units TABS Take 1 tablet by mouth daily.    Yes [provider]    Physical Exam: Vitals:   07/22/20 0145 07/22/20 0215 07/22/20 0245 07/22/20 0315  BP: (!) 146/98 (!) 149/86 137/82 (!) 149/83  Pulse: 89 85 83 83  Resp: (!) 32 (!) 31 (!) 31 (!) 29  Temp:      TempSrc:      SpO2: 99% 99% 100% 100%  Weight:      Height:        Constitutional: Acute alert and oriented x3, patient is in mild respiratory distress. Skin: no rashes, no lesions, poor skin turgor noted. Eyes: Pupils are equally reactive to light.  No evidence of scleral icterus or conjunctival pallor.    ENMT: Dry mucous membranes noted.  Posterior pharynx clear of any exudate or lesions.   Neck: normal, supple, no masses, no thyromegaly.  No evidence of jugular venous distension.   Respiratory: Notable bibasilar and mid field rales with scattered rhonchi.  No evidence of wheezing.  Patient is in respiratory distress without accessory muscle use.  Cardiovascular: Regular rate and rhythm, no murmurs / rubs / gallops.  Significant bilateral lower extremity pitting edema that tracks up towards the knees.  2+ pedal pulses. No carotid bruits.  Chest:   Nontender without crepitus or deformity.   Back:   Nontender without crepitus or deformity. Abdomen: Mild left-sided abdominal tenderness.  No evidence of intra-abdominal masses.  Positive bowel sounds noted in all quadrants.   Musculoskeletal: Notable tenderness of the distal bilateral lower extremities.  No evidence of deformity however.  Good ROM, no contractures. Normal muscle tone.  Neurologic: CN 2-12 grossly intact. Sensation intact, patient is moving all 4 extremities spontaneously.  Patient is following all commands.  Patient is responsive to verbal stimuli.   Psychiatric: Patient exhibits an anxious mood with labile affect..  Patient seems to possess insight as to theircurrent situation.     Labs on Admission: I have personally reviewed following labs and imaging studies -   CBC: Recent Labs  Lab 07/21/20 2013  WBC 13.0*  NEUTROABS 10.2*  HGB 13.0  HCT 40.3  MCV 90.2  PLT 630   Basic Metabolic Panel: Recent Labs  Lab 07/21/20 2013  NA 136  K 3.8  CL 102  CO2 22  GLUCOSE 182*  BUN 22  CREATININE 1.02*  CALCIUM 8.9   GFR: Estimated Creatinine Clearance: 44.1 mL/min (A) (by C-G formula based on SCr of 1.02 mg/dL (H)). Liver Function Tests: Recent Labs  Lab 07/21/20 2013  AST 27  ALT 15  ALKPHOS 66  BILITOT 0.6  PROT 8.0  ALBUMIN 3.4*   No results for input(s): LIPASE, AMYLASE in the last 168 hours. No results for  input(s): AMMONIA in the last 168 hours. Coagulation Profile: No results for input(s): INR, PROTIME in the last 168 hours. Cardiac Enzymes: No results for input(s): CKTOTAL, CKMB, CKMBINDEX, TROPONINI in the last 168 hours. BNP (last 3 results) No results for input(s): PROBNP in the last 8760 hours. HbA1C: No results for input(s): HGBA1C in the last 72 hours. CBG: No results for input(s): GLUCAP in the last 168 hours. Lipid Profile: Recent Labs    07/21/20 2013  TRIG 101   Thyroid Function Tests: No results for input(s): TSH, T4TOTAL, FREET4, T3FREE, THYROIDAB in the last 72 hours. Anemia Panel: Recent Labs    07/21/20 2013  FERRITIN 204   Urine analysis:    Component Value Date/Time   COLORURINE YELLOW 02/21/2014 1346   APPEARANCEUR CLEAR 02/21/2014 1346   LABSPEC 1.015 02/21/2014 1346   PHURINE 6.0 02/21/2014 1346   GLUCOSEU NEGATIVE 02/21/2014 1346   HGBUR TRACE (A) 02/21/2014 1346   BILIRUBINUR NEGATIVE 02/21/2014 1346   KETONESUR NEGATIVE 02/21/2014 1346   PROTEINUR NEGATIVE 02/21/2014 1346   UROBILINOGEN 0.2 02/21/2014 1346   NITRITE NEGATIVE 02/21/2014 1346   LEUKOCYTESUR NEGATIVE 02/21/2014 1346    Radiological Exams on Admission - Personally Reviewed: CT Angio Chest PE W and/or Wo Contrast  Result Date: 07/21/2020 CLINICAL DATA:  COVID-19 positive, syncopal episode, hypoxic on room air EXAM: CT ANGIOGRAPHY CHEST WITH CONTRAST TECHNIQUE: Multidetector CT imaging of the chest was performed using the standard protocol during bolus administration of intravenous contrast. Multiplanar CT image reconstructions and MIPs were obtained to evaluate the vascular anatomy. CONTRAST:  58mL OMNIPAQUE IOHEXOL 350 MG/ML SOLN COMPARISON:  Chest radiograph 04/23/2016 FINDINGS: Cardiovascular: Satisfactory opacification of the pulmonary arteries. Large saddle embolus is seen in the distal pulmonary trunk. Filling defect seen extending into the left and right main pulmonary arteries  as well as throughout the lobar and segmental pulmonary arterial branches of the left upper lobe, lingula and lower lobe. On the right, filling defect is seen predominantly within the right upper lobar, right interlobar pulmonary arteries and segmental pulmonary arteries of the right upper middle and lower lobes. Central pulmonary arteries are borderline enlarged. Marked elevation of the RV/LV ratio to 4.1. Flattening of the interventricular septum is noted. Reflux of contrast is also noted into the IVC and hepatic veins. Cardiac size remains within normal limits. Few coronary artery calcifications are present. Atherosclerotic plaque within the normal caliber aorta. No acute aortic luminal abnormality. No periaortic stranding or hemorrhage. Normal 3 vessel branching of the aortic arch  with minimal plaque in the proximal great vessels. Proximal great vessels are otherwise unremarkable. Mediastinum/Nodes: Extensive partially calcified mediastinal and hilar adenopathy seen bilaterally. No concerning mediastinal, hilar or axillary nodes. No acute abnormality of the trachea. Sliding-type hiatal hernia. Thoracic esophagus otherwise unremarkable. The thyroid gland and thoracic inlet are unremarkable. Lungs/Pleura: Extensive respiratory motion artifact limits evaluation of the lung parenchyma. There is a small right pleural effusion and some passive atelectatic changes in the right lung base. Diffuse mild airways thickening and scattered secretions are noted including some thickened and fluid-filled airways in the medial right lung base. Question some small tree-in-bud opacities present in the periphery of the right upper lobe, medial right lower lobe into a lesser extent the lingula as well. No pneumothorax. Upper Abdomen: No acute abnormalities present in the visualized portions of the upper abdomen. Musculoskeletal: No acute osseous abnormality or suspicious osseous lesion. Multilevel degenerative changes are present in  the imaged portions of the spine. Review of the MIP images confirms the above findings. IMPRESSION: 1. Extensive bilateral pulmonary emboli with a saddle embolus in the distal pulmonary trunk, extensive bilateral lobar and segmental pulmonary arterial branches throughout the lungs. Positive for acute PE with CT evidence of right heart strain (RV/LV Ratio = 4.1) consistent with at least submassive (intermediate risk) PE. The presence of right heart strain has been associated with an increased risk of morbidity and mortality. Small right pleural effusion and some passive atelectatic changes in the right lung base. 2. Diffuse mild airways thickening and scattered secretions including some thickened and fluid-filled airways in the medial right lung base. Question some small tree-in-bud opacities in the periphery of the right upper lobe, medial right lower lobe into a lesser extent the lingula as well. Findings could reflect an acute infectious or inflammatory process, particularly in the setting of COVID-19 positivity. 3. Extensive partially calcified mediastinal and hilar adenopathy, may be seen as sequela of remote granulomatous disease. 4. Sliding-type hiatal hernia. 5. Aortic Atherosclerosis (ICD10-I70.0). Critical Value/emergent results were called by telephone at the time of interpretation on 07/21/2020 at 11:05 pm to provider [facility] , who verbally acknowledged these results. Electronically Signed   By: Lovena Le M.D.   On: 07/21/2020 23:06    EKG: Personally reviewed.  Rhythm is normal sinus rhythm with heart rate of 95 bpm.  No dynamic ST segment changes appreciated.  Assessment/Plan Principal Problem:   COVID-19 virus infection   Patient presenting with nearly 2 weeks of malaise, weakness and poor appetite with 2-day history of rapidly progressive shortness of breath and cough.  COVID-19 PCR positive.  Chest imaging revealing bilateral infiltrates with tree-in-bud opacities in the right  upper lobe, right lower lobe and lingula  Symptoms secondary to COVID-19 infection with superimposed extensive bilateral pulmonary emboli and saddle embolism  Surprisingly, patient only requiring 3 L of oxygen via nasal cannula at this time  Initiating intravenous remdesivir and dexamethasone  Admitting patient to COVID-19 unit  Providing patient with as needed bronchodilator therapy via MDI  Zinc and vitamin C  Considering extensive clot burden patient is at high risk of clinical decompensation  Active Problems:   Acute saddle pulmonary embolism with acute cor pulmonale (HCC)   Evidence of bilateral pulmonary emboli with saddle embolism and evidence of right heart strain on CT angiogram of the chest  Submassive pulmonary embolism with intermediate risk  No evidence hemodynamic instability and therefore evidence does not support thrombolysis at this time.  Case discussed with Dr. Lucile Shutters with critical  care medicine who recommends that the patient remain on the hospital service.  He recommends admission to the stepdown unit for continued close clinical monitoring, echocardiography in the morning and notification of critical care medicine if patient deteriorates.  If patient becomes hemodynamically unstable patient may require reconsideration of catheter directed thrombolysis  Heparin infusion initiated  Patient will eventually need to be transitioned to oral anticoagulation once clinically improved for minimum of 3 months of anticoagulation, possibly lifelong.    Acute respiratory failure with hypoxia (HCC)   Please see assessment and plan above.    Open-angle glaucoma   Continue home regimen of drops    Mixed hyperlipidemia  Continue home regimen statin therapy  Elevated Troponin Not Due to Myocardial Infarction   Markedly elevated troponin secondary to heart straight/supply demand mismatch  Unlikely to be due to plaque rupture  Patient chest pain  free  Monitoring on telemetry  Cycling cardiac enzymes  Obtaining echo  Will consider getting cardiology consultation based on next result  Code Status:  DNR Family Communication: deferred   Status is: Inpatient  Remains inpatient appropriate because:Ongoing diagnostic testing needed not appropriate for outpatient work up, IV treatments appropriate due to intensity of illness or inability to take PO and Inpatient level of care appropriate due to severity of illness   Dispo: The patient is from: Home              Anticipated d/c is to: Home              Anticipated d/c date is: > 3 days              Patient currently is not medically stable to d/c.        Vernelle Emerald MD [facility] Pager [phone]  If 7PM-7AM, please contact night-coverage [email or web address] Use universal [place] password for that web site. If you do not have the password, please call the hospital operator.  07/22/2020, 4:23 AM

## 2020-07-22 NOTE — ED Provider Notes (Signed)
Case discussed with Dr. Lucile Shutters, from critical care, who recommends hospitalist admission.  Advises admit to ICU on stepdown protocol.  Recommends echocardiogram in the morning.  Remains available if patient decompensates.  Appreciate Dr. Cyd Silence for admitting the patient.  Advises starting remdesivir and 6 mg Decadron.  Patient remains normotensive, slightly tachypneic in the mid 20s, and maintaining normal O2 saturation on 3 L nasal cannula.   Montine Circle, PA-C 07/22/20 [id number]    Merryl Hacker, MD 07/22/20 [phone]

## 2020-07-22 NOTE — Progress Notes (Signed)
PROGRESS NOTE    Jaime [name]  ZOX:[id number] DOB: [birth year] DOA: 07/21/2020 PCP: Crist Infante, MD   Brief Narrative:  86 year old female with past medical history of osteoporosis, hyperlipidemia, open-angle glaucoma who presents to [facility] emergency department with complaints of shortness of breath. Patient explains that approximately 2 weeks ago she began to develop generalized malaise and weakness with muscle aches.  Patient symptoms worsened over the next several days and became associated with poor oral intake.  Patient did repeatedly develop fevers.  Patient reports that as her symptoms persisted she lost her appetite. Of note, patient underwent COVID-19 vaccination in early 2021. Patient symptoms progressively worsened until the patient eventually presented to a local urgent care clinic approximately 1 week ago where she was tested for COVID-19 and came back positive.  Patient went home to self quarantine and manage her symptoms conservatively.  Over half okay Supples car, she around abdominal pain scan abdomen and pelvis BMP on heart failure. Approximately 1 to 2 days prior to presentation, the patient suddenly began rapid onset shortness of breath associated with dry cough.  Patient denies any associated leg pain.  Patient does complain of bilateral lower extremity swelling but feels that she has had leg swelling for a long time.  Patient denies associated chest pain. Patient eventually presented to [facility] emergency department for evaluation where she was found to be hypoxic.  COVID-19 PCR testing remained positive.  CT angiogram of the chest revealed bilateral pulmonary emboli with evidence of saddle embolism and right heart strain thought to be secondary to COVID-19 infection.  Case was discussed with Dr. Lucile Shutters with critical care medicine who felt that it would be appropriate for medicine to admit the patient to the stepdown unit with recommendation to obtain cardiac  echocardiography the next morning.  Patient was initiated on intravenous heparin infusion.  Hospitalist group was then called to assess the patient for admission to the hospital.   Assessment & Plan:   Principal Problem:   COVID-19 virus infection Active Problems:   Mixed hyperlipidemia   Acute saddle pulmonary embolism with acute cor pulmonale (HCC)   Open-angle glaucoma   Acute respiratory failure with hypoxia (HCC)   Elevated troponin level not due myocardial infarction   Acute hypoxic respiratory failure in the setting of COVID-19 pneumonia, acute saddle pulmonary embolism with acute cor pulmonale, POA  -Patient reports vaccination early 2021\ -Covid PCR positive around 07/16/2020 -Patient has notable multifocal pneumonia on chest x-ray -D-dimer markedly elevated, greater than 20 at intake, CTA performed remarkable for saddle PE with notable acute cor pulmonale(see below) -Continue Remdesivir and dexamethasone for 5 and 10 days respectively -Continue supportive care, bronchodilators, incentive spirometry, proning, early ambulation and out of bed to chair as tolerated -Continue to wean oxygen aggressively, goal sats between 85 and 90%, transient hypoxia into the low 80s high 70s is reasonable with exertion, ambulation, eating as long as patient sats recover afterwards SpO2: 97 % O2 Flow Rate (L/min): 3 L/min Recent Labs    07/21/20 2013 07/22/20 0442  DDIMER >20.00* >20.00*  FERRITIN 204  --   LDH 232*  --   CRP 5.5* 4.8*   Acute saddle pulmonary embolism with acute cor pulmonale (HCC) -No evidence hemodynamic instability and therefore evidence does not support thrombolysis at this time. - Case discussed with Dr. Lucile Shutters PCCM who notes the patient is currently stable, reconsult if patient's vitals change or she becomes unstable -Echocardiogram pending -Continue heparin drip per pharmacy  -Follow  with PCP  in 3 months for repeat imaging  Open-angle glaucoma - Continue home  regimen of drops  Mixed hyperlipidemia - Continue home regimen statin therapy  Elevated Troponin in the setting of saddle PE and hypoxia, likely supply demand mismatch and not Due to Myocardial Infarction - Markedly elevated troponin secondary to heart straight/supply demand mismatch - Patient chest pain free -Follow echocardiogram  DVT prophylaxis: Heparin gtt per pharmacy Code Status: DNR Family Communication: Patient states she will call family and inform us if they have any further questions  Status is: Inpatient  Dispo: The patient is from: Patient              Anticipated d/c is to: Home to be determined              Anticipated d/c date is: 48 to 72 hours pending clinical course              Patient currently not medically stable for discharge given acute hypoxia in the setting of acute saddle PE symptomatic with supply demand mismatch elevated troponin requiring IV anticoagulation close monitoring in the setting of worsening condition and currently remains high risk for decompensation  Consultants:   None  Procedures:   Echocardiogram  Antimicrobials:  None  Subjective: No acute issues or events overnight, patient indicates her respiratory status is improving, she denies overt chest pain nausea vomiting diarrhea constipation headache fevers or chills.  Objective: Vitals:   07/22/20 0645 07/22/20 0715 07/22/20 0730 07/22/20 0800  BP: (!) 152/83 (!) 163/86 (!) 163/86 (!) 160/83  Pulse: 83 82 84 84  Resp: (!) 31 (!) 33 (!) 33 (!) 29  Temp:   97.9 F (36.6 C)   TempSrc:   Oral   SpO2: 96% 98% 98% 100%  Weight:      Height:        Intake/Output Summary (Last 24 hours) at 07/22/2020 0833 Last data filed at 07/22/2020 0546 Gross per 24 hour  Intake 91.8 ml  Output --  Net 91.8 ml   Filed Weights   07/21/20 1943  Weight: 90.7 kg    Examination:  General:  Pleasantly resting in bed, No acute distress. HEENT:  Normocephalic atraumatic.  Sclerae  nonicteric, noninjected.  Extraocular movements intact bilaterally. Neck:  Without mass or deformity.  Trachea is midline. Lungs:  Clear to auscultate bilaterally without rhonchi, wheeze, or rales. Heart:  Regular rate and rhythm.  Without murmurs, rubs, or gallops. Abdomen:  Soft, nontender, nondistended.  Without guarding or rebound. Extremities: Without cyanosis, clubbing, edema, or obvious deformity. Vascular:  Dorsalis pedis and posterior tibial pulses palpable bilaterally. Skin:  Warm and dry, no erythema, no ulcerations.    Data Reviewed: I have personally reviewed following labs and imaging studies  CBC: Recent Labs  Lab 07/21/20 2013 07/22/20 0442  WBC 13.0* 12.7*  NEUTROABS 10.2* 10.6*  HGB 13.0 12.6  HCT 40.3 39.3  MCV 90.2 90.3  PLT 186 366   Basic Metabolic Panel: Recent Labs  Lab 07/21/20 2013 07/22/20 0442  NA 136  --   K 3.8  --   CL 102  --   CO2 22  --   GLUCOSE 182*  --   BUN 22  --   CREATININE 1.02*  --   CALCIUM 8.9  --   MG  --  1.8  PHOS  --  3.1   GFR: Estimated Creatinine Clearance: 44.1 mL/min (A) (by C-G formula based on SCr of 1.02 mg/dL (H)).  Liver Function Tests: Recent Labs  Lab 07/21/20 2013  AST 27  ALT 15  ALKPHOS 66  BILITOT 0.6  PROT 8.0  ALBUMIN 3.4*   Recent Labs  Lab 07/22/20 0442  LIPASE 28   No results for input(s): AMMONIA in the last 168 hours. Coagulation Profile: Recent Labs  Lab 07/22/20 0442  INR 1.3*   Cardiac Enzymes: No results for input(s): CKTOTAL, CKMB, CKMBINDEX, TROPONINI in the last 168 hours. BNP (last 3 results) No results for input(s): PROBNP in the last 8760 hours. HbA1C: Recent Labs    07/22/20 0442  HGBA1C 6.0*   CBG: No results for input(s): GLUCAP in the last 168 hours. Lipid Profile: Recent Labs    07/21/20 2013  TRIG 101   Thyroid Function Tests: No results for input(s): TSH, T4TOTAL, FREET4, T3FREE, THYROIDAB in the last 72 hours. Anemia Panel: Recent Labs     07/21/20 2013  FERRITIN 204   Sepsis Labs: Recent Labs  Lab 07/21/20 2013  PROCALCITON <0.10  LATICACIDVEN 1.8    Recent Results (from the past 240 hour(s))  SARS Coronavirus 2 by RT PCR (hospital order, performed in [facility] hospital lab) Nasopharyngeal Nasopharyngeal Swab     Status: Abnormal   Collection Time: 07/21/20  8:13 PM   Specimen: Nasopharyngeal Swab  Result Value Ref Range Status   SARS Coronavirus 2 POSITIVE (A) NEGATIVE Final    Comment: RESULT CALLED TO, READ BACK BY AND VERIFIED WITH: RN Alanson Aly AT 2239 07/21/20 CRUICKSHANK A (NOTE) SARS-CoV-2 target nucleic acids are DETECTED  SARS-CoV-2 RNA is generally detectable in upper respiratory specimens  during the acute phase of infection.  Positive results are indicative  of the presence of the identified virus, but do not rule out bacterial infection or co-infection with other pathogens not detected by the test.  Clinical correlation with patient history and  other diagnostic information is necessary to determine patient infection status.  The expected result is negative.  Fact Sheet for Patients:   StrictlyIdeas.no   Fact Sheet for Healthcare Providers:   BankingDealers.co.za    This test is not yet approved or cleared by the [place] FDA and  has been authorized for detection and/or diagnosis of SARS-CoV-2 by FDA under an Emergency Use Authorization (EUA).  This EUA will remain in effect (mean ing this test can be used) for the duration of  the COVID-19 declaration under Section 564(b)(1) of the Act, 21 U.S.C. section 360-bbb-3(b)(1), unless the authorization is terminated or revoked sooner.  Performed at [facility], [place] [address], [place], [place] [address]   Blood Culture (routine x 2)     Status: None (Preliminary result)   Collection Time: 07/21/20  8:13 PM   Specimen: BLOOD  Result Value Ref Range Status   Specimen  Description   Final    BLOOD LEFT ANTECUBITAL Performed at [facility] [address], [place], [place] [address]    Special Requests   Final    BOTTLES DRAWN AEROBIC AND ANAEROBIC Blood Culture adequate volume Performed at [facility] [address], [place], [place] [address]    Culture   Final    NO GROWTH < 12 HOURS Performed at [facility] [address], [place], [place] [address]    Report Status PENDING  Incomplete  Blood Culture (routine x 2)     Status: None (Preliminary result)   Collection Time: 07/21/20  8:13 PM   Specimen: BLOOD  Result Value Ref Range Status   Specimen Description   Final    BLOOD BLOOD RIGHT HAND Performed at [facility] [address], [place], [place] [address]    Special Requests   Final    BOTTLES DRAWN AEROBIC AND ANAEROBIC Blood Culture results may not be optimal due to an inadequate volume of blood received in culture bottles Performed at [facility] [address], [place], [place] [address]    Culture   Final    NO GROWTH < 12 HOURS Performed at [facility] [address], [place], [place] [address]    Report Status PENDING  Incomplete         Radiology Studies: CT Angio Chest PE W and/or Wo Contrast  Result Date: 07/21/2020 CLINICAL DATA:  COVID-19 positive, syncopal episode, hypoxic on room air EXAM: CT ANGIOGRAPHY CHEST WITH CONTRAST TECHNIQUE: Multidetector CT imaging of the chest was performed using the standard protocol during bolus administration of intravenous contrast. Multiplanar CT image reconstructions and MIPs were obtained to evaluate the vascular anatomy. CONTRAST:  15mL OMNIPAQUE IOHEXOL 350 MG/ML SOLN COMPARISON:  Chest radiograph 04/23/2016 FINDINGS: Cardiovascular: Satisfactory opacification of the pulmonary arteries. Large saddle embolus is seen in the distal pulmonary trunk. Filling defect seen extending into the left  and right main pulmonary arteries as well as throughout the lobar and segmental pulmonary arterial branches of the left upper lobe, lingula and lower lobe. On the right, filling defect is seen predominantly within the right upper lobar, right interlobar pulmonary arteries and segmental pulmonary arteries of the right upper middle and lower lobes. Central pulmonary arteries are borderline enlarged. Marked elevation of the RV/LV ratio to 4.1. Flattening of the interventricular septum is noted. Reflux of contrast is also noted into the IVC and hepatic veins. Cardiac size remains within normal limits. Few coronary artery calcifications are present. Atherosclerotic plaque within the normal caliber aorta. No acute aortic luminal abnormality. No periaortic stranding or hemorrhage. Normal 3 vessel branching of the aortic arch with minimal plaque in the proximal great vessels. Proximal great vessels are otherwise unremarkable. Mediastinum/Nodes: Extensive partially calcified mediastinal and hilar adenopathy seen bilaterally. No concerning mediastinal, hilar or axillary nodes. No acute abnormality of the trachea. Sliding-type hiatal hernia. Thoracic esophagus otherwise unremarkable. The thyroid gland and thoracic inlet are unremarkable. Lungs/Pleura: Extensive respiratory motion artifact limits evaluation of the lung parenchyma. There is a small right pleural effusion and some passive atelectatic changes in the right lung base. Diffuse mild airways thickening and scattered secretions are noted including some thickened and fluid-filled airways in the medial right lung base. Question some small tree-in-bud opacities present in the periphery of the right upper lobe, medial right lower lobe into a lesser extent the lingula as well. No pneumothorax. Upper Abdomen: No acute abnormalities present in the visualized portions of the upper abdomen. Musculoskeletal: No acute osseous abnormality or suspicious osseous lesion. Multilevel  degenerative changes are present in the imaged portions of the spine. Review of the MIP images confirms the above findings. IMPRESSION: 1. Extensive bilateral pulmonary emboli with a saddle embolus in the distal pulmonary trunk, extensive bilateral lobar and segmental pulmonary arterial branches throughout the lungs. Positive for acute PE with CT evidence of right heart strain (RV/LV Ratio = 4.1) consistent with at least submassive (intermediate risk) PE. The presence of right heart strain has been associated with an increased risk of morbidity and mortality. Small right pleural effusion and some passive atelectatic changes in the  right lung base. 2. Diffuse mild airways thickening and scattered secretions including some thickened and fluid-filled airways in the medial right lung base. Question some small tree-in-bud opacities in the periphery of the right upper lobe, medial right lower lobe into a lesser extent the lingula as well. Findings could reflect an acute infectious or inflammatory process, particularly in the setting of COVID-19 positivity. 3. Extensive partially calcified mediastinal and hilar adenopathy, may be seen as sequela of remote granulomatous disease. 4. Sliding-type hiatal hernia. 5. Aortic Atherosclerosis (ICD10-I70.0). Critical Value/emergent results were called by telephone at the time of interpretation on 07/21/2020 at 11:05 pm to provider [facility] , who verbally acknowledged these results. Electronically Signed   By: Lovena Le M.D.   On: 07/21/2020 23:06        Scheduled Meds: . albuterol  2 puff Inhalation Q6H  . vitamin C  500 mg Oral Daily  . [START ON 07/23/2020] dexamethasone (DECADRON) injection  6 mg Intravenous Q24H  . dorzolamide-timolol  1 drop Both Eyes BID  . latanoprost  1 drop Both Eyes QHS  . loratadine  10 mg Oral Daily  . rosuvastatin  2.5 mg Oral Daily  . zinc sulfate  220 mg Oral Daily   Continuous Infusions: . lactated ringers 100 mL/hr at  07/22/20 0451  . [START ON 07/23/2020] remdesivir 100 mg in NS 100 mL       LOS: 0 days   Time spent: 56min  Aero Drummonds C Young Mulvey, DO [facility]  If 7PM-7AM, please contact night-coverage [email or web address]  07/22/2020, 8:33 AM

## 2020-07-22 NOTE — Progress Notes (Signed)
TOC CM contacted dtr and states she will give pharmacy a call. She spoke to [facility] and pt has [place]. [employer] Medicare was in chart. Contacted [facility] # [phone]. Pt has [place], [place], [place] [id number], Group # [id number], # [id number]. Will need benefits check on Eliquis or Xarelto. They have Eliquis started pack in stock but will have to order Xarelto starter pack.   [place], [place] [facility] TOC CM [phone]

## 2020-07-22 NOTE — Progress Notes (Signed)
ANTICOAGULATION CONSULT NOTE - follow up  Pharmacy Consult for Heparin Indication: pulmonary embolus  Allergies  Allergen Reactions  . Codeine     "makes me feel weird"  . Meperidine Hcl     Lowers blood pressure   . Adhesive [Tape] Rash  . Sulfonamide Derivatives Rash    Patient Measurements: Height: 5\' 5"  (165.1 cm) Weight: 90.7 kg (200 lb) IBW/kg (Calculated) : 57 HEPARIN DW (KG): 77.1   Vital Signs: Temp: 97.9 F (36.6 C) (08/31 1716) Temp Source: Oral (08/31 1716) BP: 137/66 (08/31 1716) Pulse Rate: 71 (08/31 1716)  Labs: Recent Labs    07/21/20 2013 07/21/20 2013 07/22/20 0442 07/22/20 0850 07/22/20 1440 07/22/20 1730  HGB 13.0   < > 12.6 12.5  --   --   HCT 40.3  --  39.3 39.3  --   --   PLT 186  --  175 183  --   --   APTT  --   --  186*  --   --   --   LABPROT  --   --  15.8*  --   --   --   INR  --   --  1.3*  --   --   --   HEPARINUNFRC  --   --   --  0.25*  --  0.64  CREATININE 1.02*  --   --   --   --   --   TROPONINIHS  --   --  1,617*  --  1,076*  --    < > = values in this interval not displayed.    Estimated Creatinine Clearance: 44.1 mL/min (A) (by C-G formula based on SCr of 1.02 mg/dL (H)).   Medical History: Past Medical History:  Diagnosis Date  . Arthritis    knees  . Complication of anesthesia    problem 40 yrs ago Doctor told her anesthesia drugs caused a reaction, BP drops    Medications:  Infusions:   Assessment: 86 yo COVID + F presents with worsening shortness of breath.  Chest CT + massive saddle PE with right heart strain.  Pharmacy consulted to start IV heparin.  No hx anticoagulation PTA. Baseline CBC WNL, DDimer>20. Of note, heparin infusion was stopped this AM around at 0600. I see no legitimate reason it was stopped, admitting Md wants heparin restarted. Heparin level still 0.25 drawn at 0850, despite heparin being off since about 0600 as mentioned above.   07/22/20 6:16 PM   HL 0.25 sub-therapeutic  No  bleeding or issues with infusion per RN  Goal of Therapy:  Heparin level 0.3-0.7 units/ml Monitor platelets by anticoagulation protocol: Yes   Plan:   Continue heparin infusion at 1150 units/hr and recheck with AM labs  Daily heparin level & CBC while on heparin  Monitor for s/sx of bleeding   Ulice Dash D PharmD, BCPS 07/22/2020,6:16 PM

## 2020-07-22 NOTE — Progress Notes (Signed)
  Echocardiogram 2D Echocardiogram has been performed.  Jaime [name] 07/22/2020, 2:40 PM

## 2020-07-23 LAB — BLOOD CULTURE ID PANEL (REFLEXED) - BCID2

## 2020-07-23 LAB — CBC
HCT: 39.8 % (ref 36.0–46.0)
Hemoglobin: 12.7 g/dL (ref 12.0–15.0)
MCH: 28.5 pg (ref 26.0–34.0)
MCHC: 31.9 g/dL (ref 30.0–36.0)
MCV: 89.2 fL (ref 80.0–100.0)
Platelets: 187 10*3/uL (ref 150–400)
RBC: 4.46 MIL/uL (ref 3.87–5.11)
RDW: 13.7 % (ref 11.5–15.5)
WBC: 16.5 10*3/uL — ABNORMAL HIGH (ref 4.0–10.5)
nRBC: 0 % (ref 0.0–0.2)

## 2020-07-23 LAB — COMPREHENSIVE METABOLIC PANEL
ALT: 13 U/L (ref 0–44)
AST: 26 U/L (ref 15–41)
Albumin: 3 g/dL — ABNORMAL LOW (ref 3.5–5.0)
Alkaline Phosphatase: 58 U/L (ref 38–126)
Anion gap: 9 (ref 5–15)
BUN: 28 mg/dL — ABNORMAL HIGH (ref 8–23)
CO2: 25 mmol/L (ref 22–32)
Calcium: 8.7 mg/dL — ABNORMAL LOW (ref 8.9–10.3)
Chloride: 104 mmol/L (ref 98–111)
Creatinine, Ser: 0.88 mg/dL (ref 0.44–1.00)
GFR calc Af Amer: >60 mL/min (ref >60)
GFR calc non Af Amer: 59 mL/min — ABNORMAL LOW (ref >60)
Glucose, Bld: 128 mg/dL — ABNORMAL HIGH (ref 70–99)
Potassium: 4.2 mmol/L (ref 3.5–5.1)
Sodium: 138 mmol/L (ref 135–145)
Total Bilirubin: 0.8 mg/dL (ref 0.3–1.2)
Total Protein: 7.8 g/dL (ref 6.5–8.1)

## 2020-07-23 LAB — HEPARIN LEVEL (UNFRACTIONATED)
Heparin Unfractionated: 0.91 IU/mL — ABNORMAL HIGH (ref 0.30–0.70)
Heparin Unfractionated: 0.46 IU/mL (ref 0.30–0.70)

## 2020-07-23 MED ORDER — HEPARIN (PORCINE) 25000 UT/250ML-% IV SOLN
850.0000 [IU]/h | INTRAVENOUS | Status: DC
  Administered 2020-07-23: 950 [IU]/h via INTRAVENOUS
  Administered 2020-07-24: 850 [IU]/h via INTRAVENOUS
  Filled 2020-07-23 (×2): qty 250

## 2020-07-23 NOTE — Consult Note (Addendum)
NAME:  Jaime [name], MRN:  [id number], DOB:  11-10-1932, LOS: 1 ADMISSION DATE:  07/21/2020, CONSULTATION DATE:  07/23/2020  REFERRING MD:  Sloan Leiter, CHIEF COMPLAINT: Pulmonary embolism  Brief History   86 year old lady We will came in for weakness She has been feeling quite fatigued recently She passed out following trying to get to the bathroom Subsequently brought into the emergency department  History of present illness   Tested positive for Covid Daughter tested positive about a week ago and was quarantining, this is her only known contact She had had generalized malaise, weakness, muscle pain, poor oral intake  Past Medical History   Past Medical History:  Diagnosis Date  . Arthritis    knees  . Complication of anesthesia    problem 40 yrs ago Doctor told her anesthesia drugs caused a reaction, BP drops   Significant Hospital Events   Has been stable  Consults:  pccm  Procedures:  None  Significant Diagnostic Tests:  CT chest IMPRESSION: 1. Extensive bilateral pulmonary emboli with a saddle embolus in the distal pulmonary trunk, extensive bilateral lobar and segmental pulmonary arterial branches throughout the lungs. Positive for acute PE with CT evidence of right heart strain (RV/LV Ratio = 4.1) consistent with at least submassive (intermediate risk) PE. The presence of right heart strain has been associated with an increased risk of morbidity and mortality. Small right pleural effusion and some passive atelectatic changes in the right lung base. 2. Diffuse mild airways thickening and scattered secretions including some thickened and fluid-filled airways in the medial right lung base. Question some small tree-in-bud opacities in the periphery of the right upper lobe, medial right lower lobe into a lesser extent the lingula as well. Findings could reflect an acute infectious or inflammatory process, particularly in the setting of COVID-19 positivity. 3.  Extensive partially calcified mediastinal and hilar adenopathy, may be seen as sequela of remote granulomatous disease. 4. Sliding-type hiatal hernia. 5. Aortic Atherosclerosis (ICD10-I70.0).  Echocardiogram 07/22/2020 reviewed -Evidence of right heart strain  Micro Data:  Coronavirus positive Blood culture with contaminant showing staph epidermidis  Antimicrobials:  Remdesivir  Interim history/subjective:  Elderly lady, comfortable, on oxygen supplementation with saturations at 100%  Objective   Blood pressure (!) 149/63, pulse 67, temperature 98.2 F (36.8 C), temperature source Oral, resp. rate 15, height 5\' 5"  (1.651 m), weight 90.7 kg, SpO2 100 %.        Intake/Output Summary (Last 24 hours) at 07/23/2020 1618 Last data filed at 07/23/2020 1019 Gross per 24 hour  Intake 312.81 ml  Output --  Net 312.81 ml   Filed Weights   07/21/20 1943  Weight: 90.7 kg    Examination: General: Elderly, does not appear to be in distress HENT: Moist oral mucosa Lungs: Fair air entry bilaterally with few rales at the bases Cardiovascular: S1-S2 appreciated Abdomen: Bowel sounds appreciated Extremities: No clubbing, trace edema Neuro: Alert and oriented x3 GU:   Resolved Hospital Problem list     Assessment & Plan:  Submassive PE -Evidence of right ventricular strain on CT chest and echo, elevated cardiac markers, -Discussed thrombolytics with her  Intermediate risk for poor outcome, however, she is hemodynamically very stable and in discussions about thrombolytics, she would rather continue with current anticoagulation  Saddle pulmonary embolism with acute cor pulmonale  Acute hypoxic respiratory failure secondary combination of COVID-19 pneumonia and pulmonary embolism  Very minimal oxygen requirement Noted to be saturating well without oxygen on  COVID-19 pneumonia patient She  is vaccinated   Continue to monitor closely Risk of decompensation is  significant Discussed with Dr. Sloan Leiter  The patient is critically ill with multiple organ systems failure and requires high complexity decision making for assessment and support, frequent evaluation and titration of therapies, application of advanced monitoring technologies and extensive interpretation of multiple databases. Critical Care Time devoted to patient care services described in this note independent of APP/resident time (if applicable)  is 30 minutes.   Sherrilyn Rist MD [facility] Personal pager: [phone] If unanswered, please page CCM On-call: [phone]   Call if needed, continue anticoagulation

## 2020-07-23 NOTE — Progress Notes (Signed)
PROGRESS NOTE    Jaime [name]  JIR:[id number] DOB: 02-Dec-1931 DOA: 07/21/2020 PCP: Crist Infante, MD    Brief Narrative:  86 year old female with history of osteoporosis, hyperlipidemia, vaccinated against COVID-19 presented to the ER with about 2 weeks of generalized malaise and weakness and muscle aches and poor oral intake and extreme fatigue.  She also had intermittent low-grade fever.  She was tested positive for COVID-19 on 8/23 after her daughter was tested positive a week ago.  Also has bilateral lower extremity swelling. In the emergency room, hemodynamically stable.  On 2 L oxygen.  CTA of the chest showed bilateral pulmonary emboli with saddle embolism and right heart strain.  Started on heparin infusion and admitted to the hospital.   Assessment & Plan:   Principal Problem:   COVID-19 virus infection Active Problems:   Mixed hyperlipidemia   Acute saddle pulmonary embolism with acute cor pulmonale (HCC)   Open-angle glaucoma   Acute respiratory failure with hypoxia (HCC)   Elevated troponin level not due myocardial infarction   Acute hypoxemic respiratory failure due to COVID-19 ([place])  Acute hypoxemic respiratory failure secondary to combination of COVID-19 pneumonia and acute saddle pulmonary embolism with acute cor pulmonale: Pulmonary embolism: Patient is currently on room air to minimum oxygen requirement.  2D echocardiogram showed evidence of right heart strain.  Given patient's stability, as suggested by pulmonology patient is not catheter directed TPA candidate. Tolerating heparin.  Will continue for next 24 to 48 hours and start on Eliquis.  COVID-19 pneumonia in a vaccinated patient: Does have evidence of bilateral pneumonia.  Hypoxic especially given saddle PE. Continue to monitor due to significant symptoms  chest physiotherapy, incentive spirometry, deep breathing exercises, sputum induction, mucolytic's and bronchodilators. Supplemental oxygen to keep  saturations more than 90%. Covid directed therapy with , steroids, on dexamethasone remdesivir, day 2/5 antibiotics not indicated Due to severity of symptoms, patient will need daily inflammatory markers, chest x-rays, liver function test to monitor and direct COVID-19 therapies.  Elevated troponins: Due to acute PE.   DVT prophylaxis:  Heparin infusion   Code Status: DNR Family Communication: Daughter on the phone Disposition Plan: Status is: Inpatient  Remains inpatient appropriate because:Hemodynamically unstable and Inpatient level of care appropriate due to severity of illness   Dispo: The patient is from: Home              Anticipated d/c is to: Home              Anticipated d/c date is: 3 days              Patient currently is not medically stable to d/c.         Consultants:   Pulmonary, curbside  Procedures:   None  Antimicrobials:  Antibiotics Given (last 72 hours)    Date/Time Action Medication Dose Rate   07/22/20 0145 New Bag/Given   remdesivir 200 mg in sodium chloride 0.9% 250 mL IVPB 200 mg 580 mL/hr   07/23/20 0937 New Bag/Given   remdesivir 100 mg in sodium chloride 0.9 % 100 mL IVPB 100 mg 200 mL/hr         Subjective: Patient seen and examined.  No overnight events.  Feels very tired and wonders why.  Still in the emergency room waiting for inpatient bed availability.  Has not mobilized yet.  Denies any nausea vomiting.  Objective: Vitals:   07/23/20 1030 07/23/20 1150 07/23/20 1344 07/23/20 1445  BP: (!) 159/62 (!) 148/66 Marland Kitchen)  156/65 (!) 141/73  Pulse: 67 64 65 66  Resp: (!) 23 (!) 27 (!) 26 (!) 26  Temp:      TempSrc:      SpO2: 100% 99% 98% 100%  Weight:      Height:        Intake/Output Summary (Last 24 hours) at 07/23/2020 1519 Last data filed at 07/23/2020 1019 Gross per 24 hour  Intake 312.81 ml  Output --  Net 312.81 ml   Filed Weights   07/21/20 1943  Weight: 90.7 kg    Examination:  General exam: Appears calm  and comfortable  Chronically sick looking.  Not in any distress. Respiratory system: Clear to auscultation. Respiratory effort normal.  No added sounds. Cardiovascular system: S1 & S2 heard, RRR. No JVD, murmurs, rubs, gallops or clicks.  Bilateral 1+ pedal edema.  Right calf tenderness present.  Bevelyn Buckles' sign negative.   Gastrointestinal system: Abdomen is nondistended, soft and nontender. No organomegaly or masses felt. Normal bowel sounds heard. Central nervous system: Alert and oriented. No focal neurological deficits. Extremities: Symmetric 5 x 5 power. Skin: No rashes, lesions or ulcers Psychiatry: Judgement and insight appear normal. Mood & affect appropriate.     Data Reviewed: I have personally reviewed following labs and imaging studies  CBC: Recent Labs  Lab 07/21/20 2013 07/22/20 0442 07/22/20 0850 07/23/20 0454  WBC 13.0* 12.7* 12.9* 16.5*  NEUTROABS 10.2* 10.6*  --   --   HGB 13.0 12.6 12.5 12.7  HCT 40.3 39.3 39.3 39.8  MCV 90.2 90.3 89.9 89.2  PLT 186 175 183 035   Basic Metabolic Panel: Recent Labs  Lab 07/21/20 2013 07/22/20 0442 07/23/20 0454  NA 136  --  138  K 3.8  --  4.2  CL 102  --  104  CO2 22  --  25  GLUCOSE 182*  --  128*  BUN 22  --  28*  CREATININE 1.02*  --  0.88  CALCIUM 8.9  --  8.7*  MG  --  1.8  --   PHOS  --  3.1  --    GFR: Estimated Creatinine Clearance: 51.1 mL/min (by C-G formula based on SCr of 0.88 mg/dL). Liver Function Tests: Recent Labs  Lab 07/21/20 2013 07/23/20 0454  AST 27 26  ALT 15 13  ALKPHOS 66 58  BILITOT 0.6 0.8  PROT 8.0 7.8  ALBUMIN 3.4* 3.0*   Recent Labs  Lab 07/22/20 0442  LIPASE 28   No results for input(s): AMMONIA in the last 168 hours. Coagulation Profile: Recent Labs  Lab 07/22/20 0442  INR 1.3*   Cardiac Enzymes: No results for input(s): CKTOTAL, CKMB, CKMBINDEX, TROPONINI in the last 168 hours. BNP (last 3 results) No results for input(s): PROBNP in the last 8760  hours. HbA1C: Recent Labs    07/22/20 0442  HGBA1C 6.0*   CBG: No results for input(s): GLUCAP in the last 168 hours. Lipid Profile: Recent Labs    07/21/20 2013  TRIG 101   Thyroid Function Tests: No results for input(s): TSH, T4TOTAL, FREET4, T3FREE, THYROIDAB in the last 72 hours. Anemia Panel: Recent Labs    07/21/20 2013  FERRITIN 204   Sepsis Labs: Recent Labs  Lab 07/21/20 2013  PROCALCITON <0.10  LATICACIDVEN 1.8    Recent Results (from the past 240 hour(s))  SARS Coronavirus 2 by RT PCR (hospital order, performed in [facility] hospital lab) Nasopharyngeal Nasopharyngeal Swab     Status: Abnormal  Collection Time: 07/21/20  8:13 PM   Specimen: Nasopharyngeal Swab  Result Value Ref Range Status   SARS Coronavirus 2 POSITIVE (A) NEGATIVE Final    Comment: RESULT CALLED TO, READ BACK BY AND VERIFIED WITH: RN Alanson Aly AT 2239 07/21/20 CRUICKSHANK A (NOTE) SARS-CoV-2 target nucleic acids are DETECTED  SARS-CoV-2 RNA is generally detectable in upper respiratory specimens  during the acute phase of infection.  Positive results are indicative  of the presence of the identified virus, but do not rule out bacterial infection or co-infection with other pathogens not detected by the test.  Clinical correlation with patient history and  other diagnostic information is necessary to determine patient infection status.  The expected result is negative.  Fact Sheet for Patients:   StrictlyIdeas.no   Fact Sheet for Healthcare Providers:   BankingDealers.co.za    This test is not yet approved or cleared by the [place] FDA and  has been authorized for detection and/or diagnosis of SARS-CoV-2 by FDA under an Emergency Use Authorization (EUA).  This EUA will remain in effect (mean ing this test can be used) for the duration of  the COVID-19 declaration under Section 564(b)(1) of the Act, 21 U.S.C. section  360-bbb-3(b)(1), unless the authorization is terminated or revoked sooner.  Performed at [facility], [place] [address], [place], [place] [address]   Blood Culture (routine x 2)     Status: None (Preliminary result)   Collection Time: 07/21/20  8:13 PM   Specimen: BLOOD  Result Value Ref Range Status   Specimen Description   Final    BLOOD LEFT ANTECUBITAL Performed at [facility] [address], [place], [place] [address]    Special Requests   Final    BOTTLES DRAWN AEROBIC AND ANAEROBIC Blood Culture adequate volume Performed at [facility] [address], [place], [place] [address]    Culture   Final    NO GROWTH 2 DAYS Performed at [facility] [address] [place], [place] [address]    Report Status PENDING  Incomplete  Blood Culture (routine x 2)     Status: Abnormal (Preliminary result)   Collection Time: 07/21/20  8:13 PM   Specimen: BLOOD RIGHT HAND  Result Value Ref Range Status   Specimen Description   Final    BLOOD RIGHT HAND Performed at [facility], [place] [address], [place], [place] [address]    Special Requests   Final    BOTTLES DRAWN AEROBIC AND ANAEROBIC Blood Culture results may not be optimal due to an inadequate volume of blood received in culture bottles Performed at [facility] [address] [place], [place] [address]    Culture  Setup Time   Final    GRAM POSITIVE COCCI IN CLUSTERS IN BOTH AEROBIC AND ANAEROBIC BOTTLES CRITICAL RESULT CALLED TO, READ BACK BY AND VERIFIED WITH: Sheffield Slider [facility] 07/23/20 0125 JDW Performed at [facility], [place] [address] [place],  [address]    Culture STAPHYLOCOCCUS EPIDERMIDIS (A)  Final   Report Status PENDING  Incomplete  Blood Culture ID Panel (Reflexed)     Status: Abnormal   Collection Time: 07/21/20  8:13 PM  Result Value Ref Range Status   Enterococcus faecalis NOT DETECTED NOT DETECTED Final    Enterococcus Faecium NOT DETECTED NOT DETECTED Final   Listeria monocytogenes NOT DETECTED NOT DETECTED Final   Staphylococcus species DETECTED (A) NOT DETECTED Final  Comment: CRITICAL RESULT CALLED TO, READ BACK BY AND VERIFIED WITH: Sheffield Slider [facility] 07/23/20 0125    Staphylococcus aureus (BCID) NOT DETECTED NOT DETECTED Final   Staphylococcus epidermidis DETECTED (A) NOT DETECTED Final    Comment: Methicillin (oxacillin) resistant coagulase negative staphylococcus. Possible blood culture contaminant (unless isolated from more than one blood culture draw or clinical case suggests pathogenicity). No antibiotic treatment is indicated for blood  culture contaminants. CRITICAL RESULT CALLED TO, READ BACK BY AND VERIFIED WITH: Sheffield Slider [facility] 07/23/20 0125 JDW    Staphylococcus lugdunensis NOT DETECTED NOT DETECTED Final   Streptococcus species NOT DETECTED NOT DETECTED Final   Streptococcus agalactiae NOT DETECTED NOT DETECTED Final   Streptococcus pneumoniae NOT DETECTED NOT DETECTED Final   Streptococcus pyogenes NOT DETECTED NOT DETECTED Final   A.calcoaceticus-baumannii NOT DETECTED NOT DETECTED Final   Bacteroides fragilis NOT DETECTED NOT DETECTED Final   Enterobacterales NOT DETECTED NOT DETECTED Final   Enterobacter cloacae complex NOT DETECTED NOT DETECTED Final   Escherichia coli NOT DETECTED NOT DETECTED Final   Klebsiella aerogenes NOT DETECTED NOT DETECTED Final   Klebsiella oxytoca NOT DETECTED NOT DETECTED Final   Klebsiella pneumoniae NOT DETECTED NOT DETECTED Final   Proteus species NOT DETECTED NOT DETECTED Final   Salmonella species NOT DETECTED NOT DETECTED Final   Serratia marcescens NOT DETECTED NOT DETECTED Final   Haemophilus influenzae NOT DETECTED NOT DETECTED Final   Neisseria meningitidis NOT DETECTED NOT DETECTED Final   Pseudomonas aeruginosa NOT DETECTED NOT DETECTED Final   Stenotrophomonas maltophilia NOT DETECTED NOT DETECTED Final   Candida  albicans NOT DETECTED NOT DETECTED Final   Candida auris NOT DETECTED NOT DETECTED Final   Candida glabrata NOT DETECTED NOT DETECTED Final   Candida krusei NOT DETECTED NOT DETECTED Final   Candida parapsilosis NOT DETECTED NOT DETECTED Final   Candida tropicalis NOT DETECTED NOT DETECTED Final   Cryptococcus neoformans/gattii NOT DETECTED NOT DETECTED Final   Methicillin resistance mecA/C DETECTED (A) NOT DETECTED Final    Comment: CRITICAL RESULT CALLED TO, READ BACK BY AND VERIFIED WITH: Sheffield Slider [facility] 07/23/20 0125 JDW Performed at [facility], [address] [place], [place] [address]          Radiology Studies: CT Angio Chest PE W and/or Wo Contrast  Result Date: 07/21/2020 CLINICAL DATA:  COVID-19 positive, syncopal episode, hypoxic on room air EXAM: CT ANGIOGRAPHY CHEST WITH CONTRAST TECHNIQUE: Multidetector CT imaging of the chest was performed using the standard protocol during bolus administration of intravenous contrast. Multiplanar CT image reconstructions and MIPs were obtained to evaluate the vascular anatomy. CONTRAST:  32mL OMNIPAQUE IOHEXOL 350 MG/ML SOLN COMPARISON:  Chest radiograph 04/23/2016 FINDINGS: Cardiovascular: Satisfactory opacification of the pulmonary arteries. Large saddle embolus is seen in the distal pulmonary trunk. Filling defect seen extending into the left and right main pulmonary arteries as well as throughout the lobar and segmental pulmonary arterial branches of the left upper lobe, lingula and lower lobe. On the right, filling defect is seen predominantly within the right upper lobar, right interlobar pulmonary arteries and segmental pulmonary arteries of the right upper middle and lower lobes. Central pulmonary arteries are borderline enlarged. Marked elevation of the RV/LV ratio to 4.1. Flattening of the interventricular septum is noted. Reflux of contrast is also noted into the IVC and hepatic veins. Cardiac size remains within normal  limits. Few coronary artery calcifications are present. Atherosclerotic plaque within the normal caliber aorta. No acute aortic luminal abnormality. No  periaortic stranding or hemorrhage. Normal 3 vessel branching of the aortic arch with minimal plaque in the proximal great vessels. Proximal great vessels are otherwise unremarkable. Mediastinum/Nodes: Extensive partially calcified mediastinal and hilar adenopathy seen bilaterally. No concerning mediastinal, hilar or axillary nodes. No acute abnormality of the trachea. Sliding-type hiatal hernia. Thoracic esophagus otherwise unremarkable. The thyroid gland and thoracic inlet are unremarkable. Lungs/Pleura: Extensive respiratory motion artifact limits evaluation of the lung parenchyma. There is a small right pleural effusion and some passive atelectatic changes in the right lung base. Diffuse mild airways thickening and scattered secretions are noted including some thickened and fluid-filled airways in the medial right lung base. Question some small tree-in-bud opacities present in the periphery of the right upper lobe, medial right lower lobe into a lesser extent the lingula as well. No pneumothorax. Upper Abdomen: No acute abnormalities present in the visualized portions of the upper abdomen. Musculoskeletal: No acute osseous abnormality or suspicious osseous lesion. Multilevel degenerative changes are present in the imaged portions of the spine. Review of the MIP images confirms the above findings. IMPRESSION: 1. Extensive bilateral pulmonary emboli with a saddle embolus in the distal pulmonary trunk, extensive bilateral lobar and segmental pulmonary arterial branches throughout the lungs. Positive for acute PE with CT evidence of right heart strain (RV/LV Ratio = 4.1) consistent with at least submassive (intermediate risk) PE. The presence of right heart strain has been associated with an increased risk of morbidity and mortality. Small right pleural effusion and  some passive atelectatic changes in the right lung base. 2. Diffuse mild airways thickening and scattered secretions including some thickened and fluid-filled airways in the medial right lung base. Question some small tree-in-bud opacities in the periphery of the right upper lobe, medial right lower lobe into a lesser extent the lingula as well. Findings could reflect an acute infectious or inflammatory process, particularly in the setting of COVID-19 positivity. 3. Extensive partially calcified mediastinal and hilar adenopathy, may be seen as sequela of remote granulomatous disease. 4. Sliding-type hiatal hernia. 5. Aortic Atherosclerosis (ICD10-I70.0). Critical Value/emergent results were called by telephone at the time of interpretation on 07/21/2020 at 11:05 pm to provider [facility] , who verbally acknowledged these results. Electronically Signed   By: Lovena Le M.D.   On: 07/21/2020 23:06   ECHOCARDIOGRAM COMPLETE  Result Date: 07/22/2020    ECHOCARDIOGRAM REPORT   Patient Name:   Jaime [name] Date of Exam: 07/22/2020 Medical Rec #:  [id number]     Height:       65.0 in Accession #:    [id number]    Weight:       200.0 lb Date of Birth:  02/24/1932     BSA:          1.978 m Patient Age:    36 years      BP:           141/71 mmHg Patient Gender: F             HR:           75 bpm. Exam Location:  Inpatient Procedure: 2D Echo, Cardiac Doppler, Color Doppler and Intracardiac            Opacification Agent Indications:    I26.02 Pulmonary embolus  History:        Patient has no prior history of Echocardiogram examinations.                 COVID-19 +.  Sonographer:  [place] Referring Phys: [id number] [place]  1. Left ventricular ejection fraction, by estimation, is 55 to 60%. The left ventricle has normal function. The left ventricle has no regional wall motion abnormalities. There is mild left ventricular hypertrophy. Left ventricular diastolic parameters are consistent with  Grade I diastolic dysfunction (impaired relaxation).  2. Right ventricular systolic function is moderately reduced. Preservation of RV apical function with severe hypokinesis of the RV free wall is consistent with McConnell's sign seen with pulmonary embolism. The right ventricular size is moderately enlarged. There is moderately elevated pulmonary artery systolic pressure. The estimated right ventricular systolic pressure is 83.1 mmHg. Mildly D-shaped interventricular septum suggests RV pressure/volume overload.  3. Left atrial size was mildly dilated.  4. Right atrial size was mildly dilated.  5. The mitral valve is normal in structure. No evidence of mitral valve regurgitation. No evidence of mitral stenosis.  6. The aortic valve is tricuspid. Aortic valve regurgitation is mild. Mild aortic valve sclerosis is present, with no evidence of aortic valve stenosis.  7. The inferior vena cava is dilated in size with <50% respiratory variability, suggesting right atrial pressure of 15 mmHg. FINDINGS  Left Ventricle: Left ventricular ejection fraction, by estimation, is 55 to 60%. The left ventricle has normal function. The left ventricle has no regional wall motion abnormalities. Definity contrast agent was given IV to delineate the left ventricular  endocardial borders. The left ventricular internal cavity size was normal in size. There is mild left ventricular hypertrophy. Left ventricular diastolic parameters are consistent with Grade I diastolic dysfunction (impaired relaxation). Right Ventricle: The right ventricular size is moderately enlarged. No increase in right ventricular wall thickness. Right ventricular systolic function is moderately reduced. There is moderately elevated pulmonary artery systolic pressure. The tricuspid  regurgitant velocity is 2.91 m/s, and with an assumed right atrial pressure of 15 mmHg, the estimated right ventricular systolic pressure is 51.7 mmHg. Left Atrium: Left atrial size was  mildly dilated. Right Atrium: Right atrial size was mildly dilated. Pericardium: There is no evidence of pericardial effusion. Mitral Valve: The mitral valve is normal in structure. No evidence of mitral valve regurgitation. No evidence of mitral valve stenosis. Tricuspid Valve: The tricuspid valve is normal in structure. Tricuspid valve regurgitation is trivial. Aortic Valve: The aortic valve is tricuspid. Aortic valve regurgitation is mild. Aortic regurgitation PHT measures 548 msec. Mild aortic valve sclerosis is present, with no evidence of aortic valve stenosis. Pulmonic Valve: The pulmonic valve was normal in structure. Pulmonic valve regurgitation is not visualized. Aorta: The aortic root is normal in size and structure. Venous: The inferior vena cava is dilated in size with less than 50% respiratory variability, suggesting right atrial pressure of 15 mmHg. IAS/Shunts: No atrial level shunt detected by color flow Doppler.  LEFT VENTRICLE PLAX 2D LVIDd:         4.00 cm  Diastology LVIDs:         3.30 cm  LV e' lateral:   4.90 cm/s LV PW:         1.20 cm  LV E/e' lateral: 14.4 LV IVS:        1.10 cm  LV e' medial:    5.00 cm/s LVOT diam:     1.60 cm  LV E/e' medial:  14.1 LV SV:         39 LV SV Index:   20 LVOT Area:     2.01 cm  RIGHT VENTRICLE  IVC RV Basal diam:  2.60 cm    IVC diam: 2.30 cm RV S prime:     6.64 cm/s TAPSE (M-mode): 1.3 cm LEFT ATRIUM             Index       RIGHT ATRIUM           Index LA diam:        3.40 cm 1.72 cm/m  RA Area:     13.60 cm LA Vol (A2C):   50.3 ml 25.43 ml/m RA Volume:   34.60 ml  17.49 ml/m LA Vol (A4C):   22.9 ml 11.58 ml/m LA Biplane Vol: 34.4 ml 17.39 ml/m  AORTIC VALVE LVOT Vmax:   102.00 cm/s LVOT Vmean:  71.600 cm/s LVOT VTI:    0.193 m AI PHT:      548 msec  AORTA Ao Root diam: 2.50 cm Ao Asc diam:  2.70 cm MITRAL VALVE               TRICUSPID VALVE MV Area (PHT): 3.72 cm    TR Peak grad:   33.9 mmHg MV Decel Time: 204 msec    TR Vmax:         291.00 cm/s MV E velocity: 70.40 cm/s MV A velocity: 75.10 cm/s  SHUNTS MV E/A ratio:  0.94        Systemic VTI:  0.19 m                            Systemic Diam: 1.60 cm Loralie Champagne MD Electronically signed by Loralie Champagne MD Signature Date/Time: 07/22/2020/5:23:39 PM    Final         Scheduled Meds: . albuterol  2 puff Inhalation Q6H  . vitamin C  500 mg Oral Daily  . dexamethasone (DECADRON) injection  6 mg Intravenous Q24H  . dorzolamide-timolol  1 drop Both Eyes BID  . latanoprost  1 drop Both Eyes QHS  . loratadine  10 mg Oral Daily  . rosuvastatin  2.5 mg Oral Daily  . zinc sulfate  220 mg Oral Daily   Continuous Infusions: . heparin 950 Units/hr (07/23/20 0712)  . remdesivir 100 mg in NS 100 mL Stopped (07/23/20 1019)     LOS: 1 day    Time spent: 35 minutes    Barb Merino, MD [facility] Pager [phone]

## 2020-07-23 NOTE — Progress Notes (Addendum)
ANTICOAGULATION CONSULT NOTE - follow up  Pharmacy Consult for Heparin Indication: pulmonary embolus  Allergies  Allergen Reactions  . Codeine     "makes me feel weird"  . Meperidine Hcl     Lowers blood pressure   . Adhesive [Tape] Rash  . Sulfonamide Derivatives Rash    Patient Measurements: Height: 5\' 5"  (165.1 cm) Weight: 90.7 kg (200 lb) IBW/kg (Calculated) : 57 HEPARIN DW (KG): 77.1   Vital Signs: Temp: 98.2 F (36.8 C) (09/01 0430) Temp Source: Oral (09/01 0430) BP: 137/65 (09/01 0500) Pulse Rate: 67 (09/01 0500)  Labs: Recent Labs    07/21/20 2013 07/21/20 2013 07/22/20 0442 07/22/20 0442 07/22/20 0850 07/22/20 1440 07/22/20 1730 07/23/20 0454  HGB 13.0   < > 12.6   < > 12.5  --   --  12.7  HCT 40.3   < > 39.3  --  39.3  --   --  39.8  PLT 186   < > 175  --  183  --   --  187  APTT  --   --  186*  --   --   --   --   --   LABPROT  --   --  15.8*  --   --   --   --   --   INR  --   --  1.3*  --   --   --   --   --   HEPARINUNFRC  --   --   --   --  0.25*  --  0.64 0.91*  CREATININE 1.02*  --   --   --   --   --   --  0.88  TROPONINIHS  --   --  1,617*  --   --  1,076* 962*  --    < > = values in this interval not displayed.    Estimated Creatinine Clearance: 51.1 mL/min (by C-G formula based on SCr of 0.88 mg/dL).   Medical History: Past Medical History:  Diagnosis Date  . Arthritis    knees  . Complication of anesthesia    problem 40 yrs ago Doctor told her anesthesia drugs caused a reaction, BP drops    Medications:  Infusions:   Assessment: 86 yo COVID + F presents with worsening shortness of breath.  Chest CT + massive saddle PE with right heart strain.  Pharmacy consulted to start IV heparin.  No hx anticoagulation PTA. Baseline CBC WNL, DDimer>20.   Significant Events: 8/31: Heparin off 0600-0850. Order to resume per hospitalist.   07/23/20 5:46 AM   HL 0.91- supra-therapeutic  No bleeding or issues with infusion per RN  RN  reports drawing lab correctly from arm opposite heparin infusion  CBC- WNL, stable  Goal of Therapy:  Heparin level 0.3-0.7 units/ml Monitor platelets by anticoagulation protocol: Yes   Plan:   Hold heparin infusion x1 hour, resume at decreased rate of 950 units/hr   Recheck heparin level in 8h  Daily heparin level & CBC while on heparin  Monitor for s/sx of bleeding  Netta Cedars PharmD, BCPS 07/23/2020,5:46 AM

## 2020-07-23 NOTE — Progress Notes (Signed)
PHARMACY - PHYSICIAN COMMUNICATION CRITICAL VALUE ALERT - BLOOD CULTURE IDENTIFICATION (BCID)  Jaime [name] is an 86 y.o. female who presented to [facility] on 07/21/2020 with a chief complaint of PE in setting of COVID infection.   Assessment:  Aerobic bottle of 1 set of blood cx + GPCC, BCID + MRSE. She is currently on remdesivir for + COVID infection.   Afebrile, procalcitonin <0.1. Presume this is a contaminant rather then true bacteremia.   Name of physician (or Provider) Contacted:  Ardith Dark, NP Current antibiotics: none  Changes to prescribed antibiotics recommended:  No antibiotics recommended at this time.  Observe off antibiotics.    Results for orders placed or performed during the hospital encounter of 07/21/20  Blood Culture ID Panel (Reflexed) (Collected: 07/21/2020  8:13 PM)  Result Value Ref Range   Enterococcus faecalis NOT DETECTED NOT DETECTED   Enterococcus Faecium NOT DETECTED NOT DETECTED   Listeria monocytogenes NOT DETECTED NOT DETECTED   Staphylococcus species DETECTED (A) NOT DETECTED   Staphylococcus aureus (BCID) NOT DETECTED NOT DETECTED   Staphylococcus epidermidis DETECTED (A) NOT DETECTED   Staphylococcus lugdunensis NOT DETECTED NOT DETECTED   Streptococcus species NOT DETECTED NOT DETECTED   Streptococcus agalactiae NOT DETECTED NOT DETECTED   Streptococcus pneumoniae NOT DETECTED NOT DETECTED   Streptococcus pyogenes NOT DETECTED NOT DETECTED   A.calcoaceticus-baumannii NOT DETECTED NOT DETECTED   Bacteroides fragilis NOT DETECTED NOT DETECTED   Enterobacterales NOT DETECTED NOT DETECTED   Enterobacter cloacae complex NOT DETECTED NOT DETECTED   Escherichia coli NOT DETECTED NOT DETECTED   Klebsiella aerogenes NOT DETECTED NOT DETECTED   Klebsiella oxytoca NOT DETECTED NOT DETECTED   Klebsiella pneumoniae NOT DETECTED NOT DETECTED   Proteus species NOT DETECTED NOT DETECTED   Salmonella species NOT DETECTED NOT DETECTED   Serratia marcescens  NOT DETECTED NOT DETECTED   Haemophilus influenzae NOT DETECTED NOT DETECTED   Neisseria meningitidis NOT DETECTED NOT DETECTED   Pseudomonas aeruginosa NOT DETECTED NOT DETECTED   Stenotrophomonas maltophilia NOT DETECTED NOT DETECTED   Candida albicans NOT DETECTED NOT DETECTED   Candida auris NOT DETECTED NOT DETECTED   Candida glabrata NOT DETECTED NOT DETECTED   Candida krusei NOT DETECTED NOT DETECTED   Candida parapsilosis NOT DETECTED NOT DETECTED   Candida tropicalis NOT DETECTED NOT DETECTED   Cryptococcus neoformans/gattii NOT DETECTED NOT DETECTED   Methicillin resistance mecA/C DETECTED (A) NOT DETECTED    Netta Cedars PharmD 07/23/2020  1:29 AM

## 2020-07-23 NOTE — Progress Notes (Signed)
Pharmacy Brief Note - Anticoagulation Follow Up:  Patient on heparin drip for PE.  Assessment:  HL = 0.46 is therapeutic on heparin infusion of 950 units/hr  Confirmed with RN that heparin infusing at correct rate. No signs of bleeding.   Goal: HL 0.3-0.7  Plan:  Continue heparin infusion at current rate of 950 units/hr  Check confirmatory HL in 8 hours  HL, CBC daily  Monitor for signs of bleeding  Lenis Noon, PharmD 07/23/20 5:36 PM

## 2020-07-23 NOTE — ED Notes (Signed)
Unable to obtain lab at this time

## 2020-07-23 NOTE — ED Notes (Signed)
Assumed care of patient at this time, nad noted, sr up x2, bed locked and low, call bell w/I reach.  Will continue to monitor. ° °

## 2020-07-23 NOTE — ED Notes (Signed)
Patient is resting comfortably. 

## 2020-07-23 NOTE — TOC Benefit Eligibility Note (Signed)
Transition of Care [facility]) Benefit Eligibility Note    Patient Details  Name: [name] MRN: [id number] Date of Birth: 06-25-1932   Medication/Dose: Alveda Reasons 20 MG DAILY  Covered?: Yes  Tier:  (PREFERRED)  Prescription Coverage Preferred Pharmacy: [facility]  Spoke with Person/Company/Phone Number:: DEBBIE  @  [employer] [phone]  Co-Pay: $ 97.54  Prior Approval: No  Deductible: Met  Additional Notes: ELIQUIS  5 MG BID   COVER- YES  CO-PAY-$49.44  TIER-PREFERRED  P/A-NO    Memory Argue Phone Number: 07/23/2020, 11:06 AM

## 2020-07-23 NOTE — ED Notes (Signed)
Eating breakfast 

## 2020-07-24 ENCOUNTER — Inpatient Hospital Stay (HOSPITAL_COMMUNITY): Payer: Medicare Other

## 2020-07-24 DIAGNOSIS — I2699 Other pulmonary embolism without acute cor pulmonale: Secondary | ICD-10-CM

## 2020-07-24 LAB — HEPARIN LEVEL (UNFRACTIONATED)
Heparin Unfractionated: 0.73 IU/mL — ABNORMAL HIGH (ref 0.30–0.70)
Heparin Unfractionated: 0.55 IU/mL (ref 0.30–0.70)
Heparin Unfractionated: 0.42 IU/mL (ref 0.30–0.70)

## 2020-07-24 LAB — COMPREHENSIVE METABOLIC PANEL
ALT: 13 U/L (ref 0–44)
AST: 23 U/L (ref 15–41)
Albumin: 2.8 g/dL — ABNORMAL LOW (ref 3.5–5.0)
Alkaline Phosphatase: 51 U/L (ref 38–126)
Anion gap: 12 (ref 5–15)
BUN: 24 mg/dL — ABNORMAL HIGH (ref 8–23)
CO2: 22 mmol/L (ref 22–32)
Calcium: 8.3 mg/dL — ABNORMAL LOW (ref 8.9–10.3)
Chloride: 105 mmol/L (ref 98–111)
Creatinine, Ser: 0.78 mg/dL (ref 0.44–1.00)
GFR calc Af Amer: >60 mL/min (ref >60)
GFR calc non Af Amer: >60 mL/min (ref >60)
Glucose, Bld: 103 mg/dL — ABNORMAL HIGH (ref 70–99)
Potassium: 4.1 mmol/L (ref 3.5–5.1)
Sodium: 139 mmol/L (ref 135–145)
Total Bilirubin: 0.7 mg/dL (ref 0.3–1.2)
Total Protein: 7 g/dL (ref 6.5–8.1)

## 2020-07-24 LAB — CBC
HCT: 38.6 % (ref 36.0–46.0)
Hemoglobin: 12.1 g/dL (ref 12.0–15.0)
MCH: 28.7 pg (ref 26.0–34.0)
MCHC: 31.3 g/dL (ref 30.0–36.0)
MCV: 91.7 fL (ref 80.0–100.0)
Platelets: 216 10*3/uL (ref 150–400)
RBC: 4.21 MIL/uL (ref 3.87–5.11)
RDW: 13.6 % (ref 11.5–15.5)
WBC: 15.7 10*3/uL — ABNORMAL HIGH (ref 4.0–10.5)
nRBC: 0 % (ref 0.0–0.2)

## 2020-07-24 LAB — CULTURE, BLOOD (ROUTINE X 2)

## 2020-07-24 NOTE — Evaluation (Signed)
Physical Therapy Evaluation Patient Details Name: JACEE ENERSON MRN: [id number] DOB: May 29, 1932 Today's Date: 07/24/2020   History of Present Illness  Patient is a 86 year old female past medical history of osteoporosis, hyperlipidemia, open-angle glaucoma that was diagnosed with COVID approximately 1 week prior to admission and was quarentining at home. Patient developed increased shortness of breath and was admitted 8/30. CT angiogram of the chest revealed bilateral pulmonary emboli with evidence of saddle embolism and right heart strain.   Clinical Impression  Pt admitted with above diagnosis.  Pt currently with functional limitations due to the deficits listed below (see PT Problem List). Pt will benefit from skilled PT to increase their independence and safety with mobility to allow discharge to the venue listed below.  Pt up to chair by OT and fatigued quickly.  Pt requesting assist back to bed upon PT arrival.  Pt current mod assist +2 for safety due to weakness and fear of falling.  Pt currently on 3L O2 [place] and SPO2 95% after transfer.  Pt appears agreeable to SNF upon d/c to improve strength and mobility prior to return home alone.     Follow Up Recommendations SNF;Supervision/Assistance - 24 hour    Equipment Recommendations  None recommended by PT    Recommendations for Other Services       Precautions / Restrictions Precautions Precautions: Fall Precaution Comments: reports fall ~4 months ago Restrictions Weight Bearing Restrictions: No      Mobility  Bed Mobility Overal bed mobility: Needs Assistance Bed Mobility: Supine to Sit     Supine to sit: Min assist     General bed mobility comments: pt attempted to perform return to supine on her own however slight assist for LEs onto bed  Transfers Overall transfer level: Needs assistance Equipment used: Rolling walker (2 wheeled) Transfers: Sit to/from Stand Sit to Stand: Mod assist;+2 physical assistance Stand pivot  transfers: Mod assist;+2 physical assistance       General transfer comment: verbal cues for hand placement and self assist; required min assist to rise and steady, pt states posterior lean however able to correct with gentle cues, pt with LE buckling however able to quickly control with mod assist and cue that staff was present to assist (pt appears very anxious about falling) improved ability with encouragement of current safety being provided (+2 and gait belt)  Ambulation/Gait             General Gait Details: pt requesting back to bed due to fatigue (just up to recliner with OT and in chair approx 20 min prior to PT session)  Stairs            Wheelchair Mobility    Modified Rankin (Stroke Patients Only)       Balance Overall balance assessment: Needs assistance;History of Falls Sitting-balance support: Feet supported Sitting balance-Leahy Scale: Fair     Standing balance support: Bilateral upper extremity supported;During functional activity Standing balance-Leahy Scale: Poor Standing balance comment: mod A with rolling walker                             Pertinent Vitals/Pain Pain Assessment: No/denies pain Faces Pain Scale: No hurt    Home Living Family/patient expects to be discharged to:: Private residence Living Arrangements: Alone Available Help at Discharge: Family;Available PRN/intermittently Type of Home: House Home Access: Ramped entrance     Home Layout: One level Home Equipment: Cane - single point;Walker -  2 wheels;Shower seat;Grab bars - tub/shower;Walker - 4 wheels Additional Comments: recently widowed    Prior Function Level of Independence: Independent with assistive device(s)         Comments: "I have a tendency to fall" last fall ~4 months ago     Hand Dominance   Dominant Hand:  (did not specify)    Extremity/Trunk Assessment   Upper Extremity Assessment Upper Extremity Assessment: Generalized weakness     Lower Extremity Assessment Lower Extremity Assessment: Generalized weakness    Cervical / Trunk Assessment Cervical / Trunk Assessment: Normal  Communication   Communication: No difficulties  Cognition Arousal/Alertness: Awake/alert Behavior During Therapy: WFL for tasks assessed/performed Overall Cognitive Status: Within Functional Limits for tasks assessed                                        General Comments General comments (skin integrity, edema, etc.): HR 73 and O2 99-100% on 3L once seated in recliner, denied shortness of breath with activity     Exercises     Assessment/Plan    PT Assessment Patient needs continued PT services  PT Problem List Decreased strength;Decreased mobility;Decreased activity tolerance;Decreased balance;Decreased knowledge of use of DME;Cardiopulmonary status limiting activity       PT Treatment Interventions DME instruction;Gait training;Balance training;Therapeutic exercise;Functional mobility training;Therapeutic activities;Patient/family education    PT Goals (Current goals can be found in the Care Plan section)  Acute Rehab PT Goals Patient Stated Goal: agreeable with up to chair PT Goal Formulation: With patient Time For Goal Achievement: 08/07/20 Potential to Achieve Goals: Good    Frequency Min 2X/week   Barriers to discharge        Co-evaluation               AM-PAC PT "6 Clicks" Mobility  Outcome Measure Help needed turning from your back to your side while in a flat bed without using bedrails?: A Little Help needed moving from lying on your back to sitting on the side of a flat bed without using bedrails?: A Little Help needed moving to and from a bed to a chair (including a wheelchair)?: A Lot Help needed standing up from a chair using your arms (e.g., wheelchair or bedside chair)?: A Lot Help needed to walk in hospital room?: Total Help needed climbing 3-5 steps with a railing? : Total 6 Click  Score: 12    End of Session Equipment Utilized During Treatment: Gait belt;Oxygen Activity Tolerance: Patient tolerated treatment well Patient left: in bed;with call bell/phone within reach;with bed alarm set Nurse Communication: Mobility status PT Visit Diagnosis: Other abnormalities of gait and mobility (R26.89)    Time: 7897-8478 PT Time Calculation (min) (ACUTE ONLY): 17 min   Charges:   PT Evaluation $PT Eval Low Complexity: 1 Low        Kati PT, DPT Acute Rehabilitation Services Pager: [phone] Office: [phone]  York Ram E 07/24/2020, 4:31 PM

## 2020-07-24 NOTE — Progress Notes (Signed)
PROGRESS NOTE    Jaime [name]  QQV:[id number] DOB: [birth year] DOA: 07/21/2020 PCP: Crist Infante, MD    Brief Narrative:  86 year old female with history of osteoporosis, hyperlipidemia, vaccinated against COVID-19 presented to the ER with about 2 weeks of generalized malaise and weakness and muscle aches and poor oral intake and extreme fatigue.  She also had intermittent low-grade fever.  She was tested positive for COVID-19 on 8/23 after her daughter was tested positive a week ago.  Also has bilateral lower extremity swelling. In the emergency room, hemodynamically stable.  On 2 L oxygen.  CTA of the chest showed bilateral pulmonary emboli with saddle embolism and right heart strain.  Started on heparin infusion and admitted to the hospital.   Assessment & Plan:   Principal Problem:   COVID-19 virus infection Active Problems:   Mixed hyperlipidemia   Acute saddle pulmonary embolism with acute cor pulmonale (HCC)   Open-angle glaucoma   Acute respiratory failure with hypoxia (HCC)   Elevated troponin level not due myocardial infarction   Acute hypoxemic respiratory failure due to COVID-19 ([place])  Acute hypoxemic respiratory failure secondary to combination of COVID-19 pneumonia and acute saddle pulmonary embolism with acute cor pulmonale: Pulmonary embolism/left lower extremity multiple vein DVT. Patient is currently on room air to minimum oxygen requirement.  2D echocardiogram showed evidence of right heart strain.  Given patient's stability, as suggested by pulmonology patient is not catheter directed TPA candidate. Tolerating heparin.  Will continue for next 24 to 48 hours and start on Eliquis. Given saddle embolus, patient needs to be on lifelong anticoagulation.   COVID-19 pneumonia in a vaccinated patient: Does have evidence of bilateral pneumonia.  Hypoxic especially given saddle PE. Continue to monitor due to significant symptoms  chest physiotherapy, incentive spirometry,  deep breathing exercises, sputum induction, mucolytic's and bronchodilators. Supplemental oxygen to keep saturations more than 90%. Covid directed therapy with , steroids, on dexamethasone remdesivir, day 3/5 antibiotics not indicated Due to severity of symptoms, patient will need daily inflammatory markers, chest x-rays, liver function test to monitor and direct COVID-19 therapies.  [place]    07/21/20 2013 07/22/20 0442  DDIMER >20.00* >20.00*  FERRITIN 204  --   LDH 232*  --   CRP 5.5* 4.8*    Lab Results  Component Value Date   SARSCOV2NAA POSITIVE (A) 07/21/2020   SpO2: 99 % O2 Flow Rate (L/min): 3 L/min   Elevated troponins: Due to acute PE.  Mobilize with PT OT.   DVT prophylaxis:  Heparin infusion   Code Status: DNR Family Communication: Daughter on the phone  Disposition Plan: Status is: Inpatient  Remains inpatient appropriate because:Hemodynamically unstable and Inpatient level of care appropriate due to severity of illness   Dispo: The patient is from: Home              Anticipated d/c is to: Home vs SNF              Anticipated d/c date is: 3 days              Patient currently is not medically stable to d/c.         Consultants:   Pulmonary,   Procedures:   None  Antimicrobials:  Antibiotics Given (last 72 hours)    Date/Time Action Medication Dose Rate   07/22/20 0145 New Bag/Given   remdesivir 200 mg in sodium chloride 0.9% 250 mL IVPB 200 mg 580 mL/hr   07/23/20 3329  New Bag/Given   remdesivir 100 mg in sodium chloride 0.9 % 100 mL IVPB 100 mg 200 mL/hr   07/24/20 1202 New Bag/Given   remdesivir 100 mg in sodium chloride 0.9 % 100 mL IVPB 100 mg 200 mL/hr         Subjective: Patient seen and examined.  No overnight events.  Negative pressure room exhaust fan is really making her difficulty to sleep.  Denies any shortness of breath at rest. Still feels tired.  She is not sure she will be able to walk with  therapies.  Not mobilized yet.  Objective: Vitals:   07/23/20 2345 07/24/20 0040 07/24/20 0225 07/24/20 0500  BP: (!) 120/56 137/66 (!) 154/65 (!) 187/80  Pulse: (!) 58 66 61 64  Resp: 20 20 20    Temp:  98 F (36.7 C) 98 F (36.7 C) 98 F (36.7 C)  TempSrc:  Oral Axillary Axillary  SpO2: 98% 95% 92% 99%  Weight:      Height:        Intake/Output Summary (Last 24 hours) at 07/24/2020 1324 Last data filed at 07/24/2020 0600 Gross per 24 hour  Intake 206.73 ml  Output --  Net 206.73 ml   Filed Weights   07/21/20 1943  Weight: 90.7 kg    Examination:  General exam: Appears calm and comfortable  Chronically sick looking.  Not in any distress. Respiratory system: Clear to auscultation. Respiratory effort normal.  No added sounds.  On 3 L of oxygen. Cardiovascular system: S1 & S2 heard, RRR. No JVD, murmurs, rubs, gallops or clicks.  Bilateral 1+ pedal edema.  Right calf tenderness present.  Bevelyn Buckles' sign negative.   Gastrointestinal system: Abdomen is nondistended, soft and nontender. No organomegaly or masses felt. Normal bowel sounds heard. Central nervous system: Alert and oriented. No focal neurological deficits. Extremities: Symmetric 5 x 5 power. Skin: No rashes, lesions or ulcers Psychiatry: Judgement and insight appear normal. Mood & affect appropriate.     Data Reviewed: I have personally reviewed following labs and imaging studies  CBC: Recent Labs  Lab 07/21/20 2013 07/22/20 0442 07/22/20 0850 07/23/20 0454 07/24/20 0049  WBC 13.0* 12.7* 12.9* 16.5* 15.7*  NEUTROABS 10.2* 10.6*  --   --   --   HGB 13.0 12.6 12.5 12.7 12.1  HCT 40.3 39.3 39.3 39.8 38.6  MCV 90.2 90.3 89.9 89.2 91.7  PLT 186 175 183 187 947   Basic Metabolic Panel: Recent Labs  Lab 07/21/20 2013 07/22/20 0442 07/23/20 0454 07/24/20 0049  NA 136  --  138 139  K 3.8  --  4.2 4.1  CL 102  --  104 105  CO2 22  --  25 22  GLUCOSE 182*  --  128* 103*  BUN 22  --  28* 24*  CREATININE  1.02*  --  0.88 0.78  CALCIUM 8.9  --  8.7* 8.3*  MG  --  1.8  --   --   PHOS  --  3.1  --   --    GFR: Estimated Creatinine Clearance: 56.2 mL/min (by C-G formula based on SCr of 0.78 mg/dL). Liver Function Tests: Recent Labs  Lab 07/21/20 2013 07/23/20 0454 07/24/20 0049  AST 27 26 23   ALT 15 13 13   ALKPHOS 66 58 51  BILITOT 0.6 0.8 0.7  PROT 8.0 7.8 7.0  ALBUMIN 3.4* 3.0* 2.8*   Recent Labs  Lab 07/22/20 0442  LIPASE 28   No results for input(s): AMMONIA in  the last 168 hours. Coagulation Profile: Recent Labs  Lab 07/22/20 0442  INR 1.3*   Cardiac Enzymes: No results for input(s): CKTOTAL, CKMB, CKMBINDEX, TROPONINI in the last 168 hours. BNP (last 3 results) No results for input(s): PROBNP in the last 8760 hours. HbA1C: Recent Labs    07/22/20 0442  HGBA1C 6.0*   CBG: No results for input(s): GLUCAP in the last 168 hours. Lipid Profile: Recent Labs    07/21/20 2013  TRIG 101   Thyroid Function Tests: No results for input(s): TSH, T4TOTAL, FREET4, T3FREE, THYROIDAB in the last 72 hours. Anemia Panel: Recent Labs    07/21/20 2013  FERRITIN 204   Sepsis Labs: Recent Labs  Lab 07/21/20 2013  PROCALCITON <0.10  LATICACIDVEN 1.8    Recent Results (from the past 240 hour(s))  SARS Coronavirus 2 by RT PCR (hospital order, performed in [facility] hospital lab) Nasopharyngeal Nasopharyngeal Swab     Status: Abnormal   Collection Time: 07/21/20  8:13 PM   Specimen: Nasopharyngeal Swab  Result Value Ref Range Status   SARS Coronavirus 2 POSITIVE (A) NEGATIVE Final    Comment: RESULT CALLED TO, READ BACK BY AND VERIFIED WITH: RN Alanson Aly AT 2239 07/21/20 CRU[place]ANK A (NOTE) SARS-CoV-2 target nucleic acids are DETECTED  SARS-CoV-2 RNA is generally detectable in upper respiratory specimens  during the acute phase of infection.  Positive results are indicative  of the presence of the identified virus, but do not rule out bacterial infection or  co-infection with other pathogens not detected by the test.  Clinical correlation with patient history and  other diagnostic information is necessary to determine patient infection status.  The expected result is negative.  Fact Sheet for Patients:   StrictlyIdeas.no   Fact Sheet for Healthcare Providers:   BankingDealers.co.za    This test is not yet approved or cleared by the [place] FDA and  has been authorized for detection and/or diagnosis of SARS-CoV-2 by FDA under an Emergency Use Authorization (EUA).  This EUA will remain in effect (mean ing this test can be used) for the duration of  the COVID-19 declaration under Section 564(b)(1) of the Act, 21 U.S.C. section 360-bbb-3(b)(1), unless the authorization is terminated or revoked sooner.  Performed at [facility], [place] [address] [place], [place] [address]   Blood Culture (routine x 2)     Status: None (Preliminary result)   Collection Time: 07/21/20  8:13 PM   Specimen: BLOOD  Result Value Ref Range Status   Specimen Description   Final    BLOOD LEFT ANTECUBITAL Performed at [place] [address] [place], [place] [address]    Special Requests   Final    BOTTLES DRAWN AEROBIC AND ANAEROBIC Blood Culture adequate volume Performed at [facility] [address] [place], [place] [address]    Culture   Final    NO GROWTH 3 DAYS Performed at [facility], [place] [address], [place], [place] [address]    Report Status PENDING  Incomplete  Blood Culture (routine x 2)     Status: Abnormal   Collection Time: 07/21/20  8:13 PM   Specimen: BLOOD RIGHT HAND  Result Value Ref Range Status   Specimen Description   Final    BLOOD RIGHT HAND Performed at [facility], [place] [address] [place],  [address]    Special Requests   Final    BOTTLES DRAWN AEROBIC AND ANAEROBIC Blood  Culture results may  not be optimal due to an inadequate volume of blood received in culture bottles Performed at [facility] [address] [place], [place] [address]    Culture  Setup Time   Final    GRAM POSITIVE COCCI IN CLUSTERS IN BOTH AEROBIC AND ANAEROBIC BOTTLES CRITICAL RESULT CALLED TO, READ BACK BY AND VERIFIED WITH: Sheffield Slider [facility] 07/23/20 0125 JDW    Culture (A)  Final    STAPHYLOCOCCUS EPIDERMIDIS THE SIGNIFICANCE OF ISOLATING THIS ORGANISM FROM A SINGLE SET OF BLOOD CULTURES WHEN MULTIPLE SETS ARE DRAWN IS UNCERTAIN. PLEASE NOTIFY THE MICROBIOLOGY DEPARTMENT WITHIN ONE WEEK IF SPECIATION AND SENSITIVITIES ARE REQUIRED. Performed at [facility], [place] [address], [place], [place] [address]    Report Status 07/24/2020 FINAL  Final  Blood Culture ID Panel (Reflexed)     Status: Abnormal   Collection Time: 07/21/20  8:13 PM  Result Value Ref Range Status   Enterococcus faecalis NOT DETECTED NOT DETECTED Final   Enterococcus Faecium NOT DETECTED NOT DETECTED Final   Listeria monocytogenes NOT DETECTED NOT DETECTED Final   Staphylococcus species DETECTED (A) NOT DETECTED Final    Comment: CRITICAL RESULT CALLED TO, READ BACK BY AND VERIFIED WITH: Sheffield Slider [facility] 07/23/20 0125    Staphylococcus aureus (BCID) NOT DETECTED NOT DETECTED Final   Staphylococcus epidermidis DETECTED (A) NOT DETECTED Final    Comment: Methicillin (oxacillin) resistant coagulase negative staphylococcus. Possible blood culture contaminant (unless isolated from more than one blood culture draw or clinical case suggests pathogenicity). No antibiotic treatment is indicated for blood  culture contaminants. CRITICAL RESULT CALLED TO, READ BACK BY AND VERIFIED WITH: Sheffield Slider [facility] 07/23/20 0125 JDW    Staphylococcus lugdunensis NOT DETECTED NOT DETECTED Final   Streptococcus species NOT DETECTED NOT DETECTED Final   Streptococcus agalactiae NOT DETECTED NOT DETECTED Final   Streptococcus  pneumoniae NOT DETECTED NOT DETECTED Final   Streptococcus pyogenes NOT DETECTED NOT DETECTED Final   A.calcoaceticus-baumannii NOT DETECTED NOT DETECTED Final   Bacteroides fragilis NOT DETECTED NOT DETECTED Final   Enterobacterales NOT DETECTED NOT DETECTED Final   Enterobacter cloacae complex NOT DETECTED NOT DETECTED Final   Escherichia coli NOT DETECTED NOT DETECTED Final   Klebsiella aerogenes NOT DETECTED NOT DETECTED Final   Klebsiella oxytoca NOT DETECTED NOT DETECTED Final   Klebsiella pneumoniae NOT DETECTED NOT DETECTED Final   Proteus species NOT DETECTED NOT DETECTED Final   Salmonella species NOT DETECTED NOT DETECTED Final   Serratia marcescens NOT DETECTED NOT DETECTED Final   Haemophilus influenzae NOT DETECTED NOT DETECTED Final   Neisseria meningitidis NOT DETECTED NOT DETECTED Final   Pseudomonas aeruginosa NOT DETECTED NOT DETECTED Final   Stenotrophomonas maltophilia NOT DETECTED NOT DETECTED Final   Candida albicans NOT DETECTED NOT DETECTED Final   Candida auris NOT DETECTED NOT DETECTED Final   Candida glabrata NOT DETECTED NOT DETECTED Final   Candida krusei NOT DETECTED NOT DETECTED Final   Candida parapsilosis NOT DETECTED NOT DETECTED Final   Candida tropicalis NOT DETECTED NOT DETECTED Final   Cryptococcus neoformans/gattii NOT DETECTED NOT DETECTED Final   Methicillin resistance mecA/C DETECTED (A) NOT DETECTED Final    Comment: CRITICAL RESULT CALLED TO, READ BACK BY AND VERIFIED WITH: Sheffield Slider [facility] 07/23/20 0125 JDW Performed at [facility], [address] [place], [place] [address]          Radiology Studies: ECHOCARDIOGRAM COMPLETE  Result Date: 07/22/2020  ECHOCARDIOGRAM REPORT   Patient Name:   BRITTANYA WINBURN Date of Exam: 07/22/2020 Medical Rec #:  [id number]     Height:       65.0 in Accession #:    [id number]    Weight:       200.0 lb Date of Birth:  09-Oct-1932     BSA:          1.978 m Patient Age:    89 years      BP:            141/71 mmHg Patient Gender: F             HR:           75 bpm. Exam Location:  Inpatient Procedure: 2D Echo, Cardiac Doppler, Color Doppler and Intracardiac            Opacification Agent Indications:    I26.02 Pulmonary embolus  History:        Patient has no prior history of Echocardiogram examinations.                 COVID-19 +.  Sonographer:    Jonelle Sidle Dance Referring Phys: [id number] [place]  1. Left ventricular ejection fraction, by estimation, is 55 to 60%. The left ventricle has normal function. The left ventricle has no regional wall motion abnormalities. There is mild left ventricular hypertrophy. Left ventricular diastolic parameters are consistent with Grade I diastolic dysfunction (impaired relaxation).  2. Right ventricular systolic function is moderately reduced. Preservation of RV apical function with severe hypokinesis of the RV free wall is consistent with McConnell's sign seen with pulmonary embolism. The right ventricular size is moderately enlarged. There is moderately elevated pulmonary artery systolic pressure. The estimated right ventricular systolic pressure is 97.6 mmHg. Mildly D-shaped interventricular septum suggests RV pressure/volume overload.  3. Left atrial size was mildly dilated.  4. Right atrial size was mildly dilated.  5. The mitral valve is normal in structure. No evidence of mitral valve regurgitation. No evidence of mitral stenosis.  6. The aortic valve is tricuspid. Aortic valve regurgitation is mild. Mild aortic valve sclerosis is present, with no evidence of aortic valve stenosis.  7. The inferior vena cava is dilated in size with <50% respiratory variability, suggesting right atrial pressure of 15 mmHg. FINDINGS  Left Ventricle: Left ventricular ejection fraction, by estimation, is 55 to 60%. The left ventricle has normal function. The left ventricle has no regional wall motion abnormalities. Definity contrast agent was given IV to delineate the left  ventricular  endocardial borders. The left ventricular internal cavity size was normal in size. There is mild left ventricular hypertrophy. Left ventricular diastolic parameters are consistent with Grade I diastolic dysfunction (impaired relaxation). Right Ventricle: The right ventricular size is moderately enlarged. No increase in right ventricular wall thickness. Right ventricular systolic function is moderately reduced. There is moderately elevated pulmonary artery systolic pressure. The tricuspid  regurgitant velocity is 2.91 m/s, and with an assumed right atrial pressure of 15 mmHg, the estimated right ventricular systolic pressure is 73.4 mmHg. Left Atrium: Left atrial size was mildly dilated. Right Atrium: Right atrial size was mildly dilated. Pericardium: There is no evidence of pericardial effusion. Mitral Valve: The mitral valve is normal in structure. No evidence of mitral valve regurgitation. No evidence of mitral valve stenosis. Tricuspid Valve: The tricuspid valve is normal in structure. Tricuspid valve regurgitation is trivial. Aortic Valve: The aortic valve is tricuspid. Aortic valve regurgitation is  mild. Aortic regurgitation PHT measures 548 msec. Mild aortic valve sclerosis is present, with no evidence of aortic valve stenosis. Pulmonic Valve: The pulmonic valve was normal in structure. Pulmonic valve regurgitation is not visualized. Aorta: The aortic root is normal in size and structure. Venous: The inferior vena cava is dilated in size with less than 50% respiratory variability, suggesting right atrial pressure of 15 mmHg. IAS/Shunts: No atrial level shunt detected by color flow Doppler.  LEFT VENTRICLE PLAX 2D LVIDd:         4.00 cm  Diastology LVIDs:         3.30 cm  LV e' lateral:   4.90 cm/s LV PW:         1.20 cm  LV E/e' lateral: 14.4 LV IVS:        1.10 cm  LV e' medial:    5.00 cm/s LVOT diam:     1.60 cm  LV E/e' medial:  14.1 LV SV:         39 LV SV Index:   20 LVOT Area:     2.01 cm   RIGHT VENTRICLE            IVC RV Basal diam:  2.60 cm    IVC diam: 2.30 cm RV S prime:     6.64 cm/s TAPSE (M-mode): 1.3 cm LEFT ATRIUM             Index       RIGHT ATRIUM           Index LA diam:        3.40 cm 1.72 cm/m  RA Area:     13.60 cm LA Vol (A2C):   50.3 ml 25.43 ml/m RA Volume:   34.60 ml  17.49 ml/m LA Vol (A4C):   22.9 ml 11.58 ml/m LA Biplane Vol: 34.4 ml 17.39 ml/m  AORTIC VALVE LVOT Vmax:   102.00 cm/s LVOT Vmean:  71.600 cm/s LVOT VTI:    0.193 m AI PHT:      548 msec  AORTA Ao Root diam: 2.50 cm Ao Asc diam:  2.70 cm MITRAL VALVE               TRICUSPID VALVE MV Area (PHT): 3.72 cm    TR Peak grad:   33.9 mmHg MV Decel Time: 204 msec    TR Vmax:        291.00 cm/s MV E velocity: 70.40 cm/s MV A velocity: 75.10 cm/s  SHUNTS MV E/A ratio:  0.94        Systemic VTI:  0.19 m                            Systemic Diam: 1.60 cm Loralie Champagne MD Electronically signed by Loralie Champagne MD Signature Date/Time: 07/22/2020/5:23:39 PM    Final    VAS [place] LOWER EXTREMITY VENOUS (DVT)  Result Date: 07/24/2020  Lower Venous DVTStudy Indications: Pulmonary embolism.  Comparison Study: no prior Performing Technologist: Abram Sander RVS  Examination Guidelines: A complete evaluation includes B-mode imaging, spectral Doppler, color Doppler, and power Doppler as needed of all accessible portions of each vessel. Bilateral testing is considered an integral part of a complete examination. Limited examinations for reoccurring indications may be performed as noted. The reflux portion of the exam is performed with the patient in reverse Trendelenburg.  +---------+---------------+---------+-----------+----------+--------------+ RIGHT    CompressibilityPhasicitySpontaneityPropertiesThrombus Aging +---------+---------------+---------+-----------+----------+--------------+ CFV      Full  Yes      Yes                                  +---------+---------------+---------+-----------+----------+--------------+ SFJ      Full                                                        +---------+---------------+---------+-----------+----------+--------------+ FV Prox  Full                                                        +---------+---------------+---------+-----------+----------+--------------+ FV Mid   Full                                                        +---------+---------------+---------+-----------+----------+--------------+ FV DistalFull                                                        +---------+---------------+---------+-----------+----------+--------------+ PFV      Full                                                        +---------+---------------+---------+-----------+----------+--------------+ POP      Full           Yes      Yes                                 +---------+---------------+---------+-----------+----------+--------------+ PTV      Full                                                        +---------+---------------+---------+-----------+----------+--------------+ PERO                                                  Not visualized +---------+---------------+---------+-----------+----------+--------------+   +---------+---------------+---------+-----------+----------+-----------------+ LEFT     CompressibilityPhasicitySpontaneityPropertiesThrombus Aging    +---------+---------------+---------+-----------+----------+-----------------+ CFV      Full           Yes      Yes                                    +---------+---------------+---------+-----------+----------+-----------------+ SFJ      Full                                                           +---------+---------------+---------+-----------+----------+-----------------+  FV Prox  Full                                                            +---------+---------------+---------+-----------+----------+-----------------+ FV Mid   None                                         Age Indeterminate +---------+---------------+---------+-----------+----------+-----------------+ FV DistalNone                                         Age Indeterminate +---------+---------------+---------+-----------+----------+-----------------+ PFV      Full                                                           +---------+---------------+---------+-----------+----------+-----------------+ POP      None           No       No                   Age Indeterminate +---------+---------------+---------+-----------+----------+-----------------+ PTV      None                                         Age Indeterminate +---------+---------------+---------+-----------+----------+-----------------+ PERO                                                  Not visualized    +---------+---------------+---------+-----------+----------+-----------------+     Summary: RIGHT: - There is no evidence of deep vein thrombosis in the lower extremity.  - No cystic structure found in the popliteal fossa.  LEFT: - Findings consistent with age indeterminate deep vein thrombosis involving the left popliteal vein, and left posterior tibial veins. - No cystic structure found in the popliteal fossa.  *See table(s) above for measurements and observations.    Preliminary         Scheduled Meds: . albuterol  2 puff Inhalation Q6H  . vitamin C  500 mg Oral Daily  . dexamethasone (DECADRON) injection  6 mg Intravenous Q24H  . dorzolamide-timolol  1 drop Both Eyes BID  . latanoprost  1 drop Both Eyes QHS  . loratadine  10 mg Oral Daily  . rosuvastatin  2.5 mg Oral Daily  . zinc sulfate  220 mg Oral Daily   Continuous Infusions: . heparin 850 Units/hr (07/24/20 0153)  . remdesivir 100 mg in NS 100 mL 100 mg (07/24/20 1202)     LOS: 2 days    Time spent: 35  minutes    Barb Merino, MD [facility] Pager [phone]

## 2020-07-24 NOTE — Progress Notes (Signed)
Lower extremity venous has been completed.   Preliminary results in CV Proc.   Jaime [name] 07/24/2020 8:47 AM

## 2020-07-24 NOTE — Progress Notes (Signed)
Occupational Therapy Evaluation  Patient with functional deficits listed below impacting safety and independence with self care. Patient reports recently widowed, spouse passed away 07/07/23. Patient has DTR in the area but works as a Education officer, museum. Patient min A with bed mobility, reports feeling dizzy seated EOB however resolved after transfer. Patient required 2 attempts with cues for hand placement, use of momentum and mod A to stand from EOB. Patient heavy mod A for stand pivot to recliner, pt pushing walker too far forward and letting go before lined up with recliner. Patient HR maintained in low to mid 70s and O2 99-100% on 3L with transfer. Recommend continued acute OT services to maximize patient strength, activity tolerance, balance, safety to facilitate D/C to venue listed below.     07/24/20 1453  OT Visit Information  Last OT Received On 07/24/20  Assistance Needed +2 (to progress)  History of Present Illness Patient is a 86 year old female past medical history of osteoporosis, hyperlipidemia, open-angle glaucoma that was diagnosed with COVID approximately 1 week prior to admission and was quarentining at home. Patient developed increased shortness of breath and was admitted 8/30. CT angiogram of the chest revealed bilateral pulmonary emboli with evidence of saddle embolism and right heart strain.   Precautions  Precautions Fall  Precaution Comments reports fall ~4 months ago  Restrictions  Weight Bearing Restrictions No  Home Living  Family/patient expects to be discharged to: Private residence  Living Arrangements Alone  Available Help at Discharge Family;Available PRN/intermittently  Type of [place] One level  Bathroom Shower/Tub Walk-in shower  [place] - single point;Walker - 2 wheels;Shower seat;Grab bars - tub/shower  Additional Comments recently widowed  Prior Function   Level of Independence Independent with assistive device(s)  Comments "I have a tendency to fall" last fall ~4 months ago  Communication  Communication No difficulties  Pain Assessment  Pain Assessment Faces  Faces Pain Scale 0  Cognition  Arousal/Alertness Awake/alert  Behavior During Therapy WFL for tasks assessed/performed  Overall Cognitive Status Within Functional Limits for tasks assessed  Upper Extremity Assessment  Upper Extremity Assessment Generalized weakness  Lower Extremity Assessment  Lower Extremity Assessment Defer to PT evaluation  ADL  Overall ADL's  Needs assistance/impaired  Grooming Set up;Sitting  Upper Body Bathing Set up;Sitting  Lower Body Bathing Maximal assistance;Sitting/lateral leans;Sit to/from stand  Upper Body Dressing  Set up;Sitting  Lower Body Dressing Maximal assistance;Sitting/lateral leans;Sit to/from Retail buyer Moderate assistance;Stand-pivot;Cueing for safety;Cueing for sequencing;BSC;RW  Toilet Transfer Details (indicate cue type and reason) to recliner, decreased safety letting go of walker prematurely, anxious with mobility and fear of falling  Toileting- Clothing Manipulation and Hygiene Set up;Sitting/lateral lean  Toileting - Clothing Manipulation Details (indicate cue type and reason) patient with stress urine incontinence, performed peri care in sitting   Functional mobility during ADLs Moderate assistance;Cueing for safety;Cueing for sequencing;Rolling walker  General ADL Comments patient requiring increased assistance with self care due to decreased strength, activity tolerance, safety awareness, balance  Bed Mobility  Overal bed mobility Needs Assistance  Bed Mobility Supine to Sit  Supine to sit Min assist;HOB elevated  General bed mobility comments increased time, cues for sequencing  Transfers  Overall transfer level Needs assistance  Equipment used Rolling walker (2 wheeled)  Transfers Sit to/from [employer] Transfers  Sit to Stand Mod assist  Stand pivot transfers Mod assist  General transfer comment verbal cues for hand placement and use of momentum to stand from EOB required 2 attempts. Patient heavy mod A with stand pivot to chair, pushes walker too far foward difficulty weight shifting to advance steps and lets go of walker before lined up with chair. poor eccentric control  Balance  Overall balance assessment Needs assistance  Sitting-balance support Feet supported  Sitting balance-Leahy Scale Fair  Standing balance support Bilateral upper extremity supported;During functional activity  Standing balance-Leahy Scale Poor  Standing balance comment mod A with rolling walker  General Comments  General comments (skin integrity, edema, etc.) HR 73 and O2 99-100% on 3L once seated in recliner, denied shortness of breath with activity   OT - End of Session  Equipment Utilized During Treatment Rolling walker;Oxygen  Activity Tolerance Patient limited by fatigue;Patient tolerated treatment well  Patient left in chair;with call bell/phone within reach;with chair alarm set  Nurse Communication Mobility status  OT Assessment  OT Recommendation/Assessment Patient needs continued OT Services  OT Visit Diagnosis Unsteadiness on feet (R26.81);Other abnormalities of gait and mobility (R26.89);Muscle weakness (generalized) (M62.81)  OT Problem List Decreased strength;Decreased activity tolerance;Impaired balance (sitting and/or standing);Decreased safety awareness;Decreased knowledge of use of DME or AE;Cardiopulmonary status limiting activity  Barriers to Discharge Comments patient's DTR works as a Pharmacist, hospital, would be unable to provide 24/7 A  OT Plan  OT Frequency (ACUTE ONLY) Min 2X/week  OT Treatment/Interventions (ACUTE ONLY) Self-care/ADL training;Therapeutic exercise;Energy conservation;DME and/or AE instruction;Therapeutic activities;Patient/family education;Balance training  AM-PAC OT "6  Clicks" Daily Activity Outcome Measure (Version 2)  Help from another person eating meals? 4  Help from another person taking care of personal grooming? 3  Help from another person toileting, which includes using toliet, bedpan, or urinal? 2  Help from another person bathing (including washing, rinsing, drying)? 2  Help from another person to put on and taking off regular upper body clothing? 3  Help from another person to put on and taking off regular lower body clothing? 2  6 Click Score 16  OT Recommendation  Follow Up Recommendations SNF;Supervision/Assistance - 24 hour  OT Equipment Other (comment) (defer to next venue)  Individuals Consulted  Consulted and Agree with Results and Recommendations Patient  Acute Rehab OT Goals  Patient Stated Goal agreeable with up to chair  OT Goal Formulation With patient  Time For Goal Achievement 08/07/20  Potential to Achieve Goals Good  OT Time Calculation  OT Start Time (ACUTE ONLY) 1341  OT Stop Time (ACUTE ONLY) 1421  OT Time Calculation (min) 40 min  OT General Charges  $OT Visit 1 Visit  OT Evaluation  $OT Eval Moderate Complexity 1 Mod  OT Treatments  $Self Care/Home Management  23-37 mins  Written Expression  Dominant Hand  (did not specify)   Delbert Phenix OT OT pager: [phone]

## 2020-07-24 NOTE — Progress Notes (Signed)
ANTICOAGULATION CONSULT NOTE - follow up  Pharmacy Consult for Heparin Indication: pulmonary embolus  Allergies  Allergen Reactions  . Codeine     "makes me feel weird"  . Meperidine Hcl     Lowers blood pressure   . Adhesive [Tape] Rash  . Sulfonamide Derivatives Rash    Patient Measurements: Height: 5\' 5"  (165.1 cm) Weight: 90.7 kg (200 lb) IBW/kg (Calculated) : 57 HEPARIN DW (KG): 77.1   Vital Signs: Temp: 98 F (36.7 C) (09/02 0040) Temp Source: Oral (09/02 0040) BP: 137/66 (09/02 0040) Pulse Rate: 66 (09/02 0040)  Labs: Recent Labs    07/21/20 2013 07/21/20 2013 07/22/20 0442 07/22/20 0442 07/22/20 0850 07/22/20 0850 07/22/20 1440 07/22/20 1730 07/22/20 1730 07/23/20 0454 07/23/20 1634 07/24/20 0049  HGB 13.0   < > 12.6   < > 12.5   < >  --   --   --  12.7  --  12.1  HCT 40.3   < > 39.3   < > 39.3  --   --   --   --  39.8  --  38.6  PLT 186   < > 175   < > 183  --   --   --   --  187  --  216  APTT  --   --  186*  --   --   --   --   --   --   --   --   --   LABPROT  --   --  15.8*  --   --   --   --   --   --   --   --   --   INR  --   --  1.3*  --   --   --   --   --   --   --   --   --   HEPARINUNFRC  --   --   --   --  0.25*   < >  --  0.64   < > 0.91* 0.46 0.73*  CREATININE 1.02*  --   --   --   --   --   --   --   --  0.88  --  0.78  TROPONINIHS  --   --  1,617*  --   --   --  1,076* 962*  --   --   --   --    < > = values in this interval not displayed.    Estimated Creatinine Clearance: 56.2 mL/min (by C-G formula based on SCr of 0.78 mg/dL).   Medical History: Past Medical History:  Diagnosis Date  . Arthritis    knees  . Complication of anesthesia    problem 40 yrs ago Doctor told her anesthesia drugs caused a reaction, BP drops    Medications:  Infusions:   Assessment: 86 yo COVID + F presents with worsening shortness of breath.  Chest CT + massive saddle PE with right heart strain.  Pharmacy consulted to start IV heparin.  No  hx anticoagulation PTA. Baseline CBC WNL, DDimer>20.   Significant Events: 8/31: Heparin off 0600-0850. Order to resume per hospitalist.   07/24/20 1:37 AM   HL 0.73- supra-therapeutic  No bleeding or issues with infusion per RN  Lab drawn peripherally from arm opposite heparin infusion  CBC- WNL, stable  Goal of Therapy:  Heparin level 0.3-0.7 units/ml Monitor platelets by anticoagulation protocol: Yes  Plan:   Decrease heparin infusion rate to 850 units/hr   Recheck heparin level in 8h  Daily heparin level & CBC while on heparin  Monitor for s/sx of bleeding  Noted plans to transition to Eliquis in next [place] PharmD, BCPS 07/24/2020,1:37 AM

## 2020-07-24 NOTE — Plan of Care (Signed)

## 2020-07-24 NOTE — Progress Notes (Signed)
Pharmacy - IV heparin  Assessment:    Please see note from Theodis Shove, PharmD earlier today for full details.  Briefly, 86 y.o. female on IV heparin for PE   Confirmatory heparin level therapeutic at 0.42 units/mL on 850 units/hr  No bleeding or infusion issues per RN  Plan:   Continue heparin infusion at 850 units/hr  Daily heparin level  Reuel Boom, PharmD, BCPS [phone] 07/24/2020, 1:52 PM

## 2020-07-24 NOTE — TOC Progression Note (Signed)
Transition of Care [facility]) - Progression Note    Patient Details  Name: Jaime [name] MRN: [id number] Date of Birth: [birth year]  [facility]) CM/SW Contact  Purcell Mouton, RN Phone Number: 07/24/2020, 1:18 PM  Clinical Narrative:     Pt live at home with spouse. Awaiting PT/OT for eval for any home health needs. TOC will continue to follow for [facility] needs.   Expected Discharge Plan: [place] Barriers to Discharge: No Barriers Identified  Expected Discharge Plan and Services Expected Discharge Plan: [place] arrangements for the past 2 months: Single Family Home                                       Social Determinants of Health (SDOH) Interventions    Readmission Risk Interventions No flowsheet data found.

## 2020-07-24 NOTE — Progress Notes (Signed)
ANTICOAGULATION CONSULT NOTE - follow up  Pharmacy Consult for Heparin Indication: pulmonary embolus  Allergies  Allergen Reactions  . Codeine     "makes me feel weird"  . Meperidine Hcl     Lowers blood pressure   . Adhesive [Tape] Rash  . Sulfonamide Derivatives Rash    Patient Measurements: Height: 5\' 5"  (165.1 cm) Weight: 90.7 kg (200 lb) IBW/kg (Calculated) : 57 HEPARIN DW (KG): 77.1   Vital Signs: Temp: 98 F (36.7 C) (09/02 0500) Temp Source: Axillary (09/02 0500) BP: 187/80 (09/02 0500) Pulse Rate: 64 (09/02 0500)  Labs: Recent Labs    07/21/20 2013 07/21/20 2013 07/22/20 0442 07/22/20 0442 07/22/20 0850 07/22/20 0850 07/22/20 1440 07/22/20 1730 07/22/20 1730 07/23/20 0454 07/23/20 0454 07/23/20 1634 07/24/20 0049 07/24/20 1029  HGB 13.0   < > 12.6   < > 12.5   < >  --   --   --  12.7  --   --  12.1  --   HCT 40.3   < > 39.3   < > 39.3  --   --   --   --  39.8  --   --  38.6  --   PLT 186   < > 175   < > 183  --   --   --   --  187  --   --  216  --   APTT  --   --  186*  --   --   --   --   --   --   --   --   --   --   --   LABPROT  --   --  15.8*  --   --   --   --   --   --   --   --   --   --   --   INR  --   --  1.3*  --   --   --   --   --   --   --   --   --   --   --   HEPARINUNFRC  --   --   --   --  0.25*   < >  --  0.64   < > 0.91*   < > 0.46 0.73* 0.55  CREATININE 1.02*  --   --   --   --   --   --   --   --  0.88  --   --  0.78  --   TROPONINIHS  --   --  1,617*  --   --   --  1,076* 962*  --   --   --   --   --   --    < > = values in this interval not displayed.    Estimated Creatinine Clearance: 56.2 mL/min (by C-G formula based on SCr of 0.78 mg/dL).   Medical History: Past Medical History:  Diagnosis Date  . Arthritis    knees  . Complication of anesthesia    problem 40 yrs ago Doctor told her anesthesia drugs caused a reaction, BP drops    Medications:  Infusions:   Assessment: 86 yo COVID + F presents with  worsening shortness of breath.  Chest CT + massive saddle PE with right heart strain.  Pharmacy consulted to start IV heparin.  No hx anticoagulation PTA. Baseline CBC WNL, DDimer>20.   Significant Events:  8/31: Heparin off 0600-0850. Order to resume per hospitalist.   07/24/20 11:42 AM   HL = 0.55 is therapeutic on heparin infusion of 850 units/hr  Confirmed with RN that heparin infusing at correct rate with no interruptions. No signs of bleeding.  CBC- WNL, stable  Goal of Therapy:  Heparin level 0.3-0.7 units/ml Monitor platelets by anticoagulation protocol: Yes   Plan:   Continue heparin infusion at current rate of 850 units/hr  Check confirmatory HL in 8 hours  Daily heparin level & CBC while on heparin  Monitor for s/sx of bleeding  Follow along for eventual transition to PO anticoagulation  Lenis Noon PharmD, BCPS 07/24/2020,11:42 AM

## 2020-07-25 LAB — COMPREHENSIVE METABOLIC PANEL
ALT: 13 U/L (ref 0–44)
AST: 21 U/L (ref 15–41)
Albumin: 2.7 g/dL — ABNORMAL LOW (ref 3.5–5.0)
Alkaline Phosphatase: 54 U/L (ref 38–126)
Anion gap: 9 (ref 5–15)
BUN: 21 mg/dL (ref 8–23)
CO2: 23 mmol/L (ref 22–32)
Calcium: 8.3 mg/dL — ABNORMAL LOW (ref 8.9–10.3)
Chloride: 108 mmol/L (ref 98–111)
Creatinine, Ser: 0.58 mg/dL (ref 0.44–1.00)
GFR calc Af Amer: >60 mL/min (ref >60)
GFR calc non Af Amer: >60 mL/min (ref >60)
Glucose, Bld: 132 mg/dL — ABNORMAL HIGH (ref 70–99)
Potassium: 3.5 mmol/L (ref 3.5–5.1)
Sodium: 140 mmol/L (ref 135–145)
Total Bilirubin: 0.5 mg/dL (ref 0.3–1.2)
Total Protein: 6.7 g/dL (ref 6.5–8.1)

## 2020-07-25 LAB — CBC
HCT: 38.2 % (ref 36.0–46.0)
Hemoglobin: 12.2 g/dL (ref 12.0–15.0)
MCH: 28.8 pg (ref 26.0–34.0)
MCHC: 31.9 g/dL (ref 30.0–36.0)
MCV: 90.3 fL (ref 80.0–100.0)
Platelets: 221 10*3/uL (ref 150–400)
RBC: 4.23 MIL/uL (ref 3.87–5.11)
RDW: 13.5 % (ref 11.5–15.5)
WBC: 10.3 10*3/uL (ref 4.0–10.5)
nRBC: 0 % (ref 0.0–0.2)

## 2020-07-25 LAB — HEPARIN LEVEL (UNFRACTIONATED): Heparin Unfractionated: 0.37 IU/mL (ref 0.30–0.70)

## 2020-07-25 MED ORDER — APIXABAN 5 MG PO TABS: 5.0000 mg | ORAL_TABLET | Freq: Two times a day (BID) | ORAL | Status: DC

## 2020-07-25 MED ORDER — APIXABAN 5 MG PO TABS
10.0000 mg | ORAL_TABLET | Freq: Two times a day (BID) | ORAL | Status: DC
  Administered 2020-07-25 – 2020-07-29 (×9): 10 mg via ORAL
  Filled 2020-07-25 (×10): qty 2

## 2020-07-25 NOTE — TOC Progression Note (Signed)
Transition of Care [facility]) - Progression Note    Patient Details  Name: Jaime [name] MRN: [id number] Date of Birth: Nov 07, 1932  [facility]) CM/SW Contact  Purcell Mouton, RN Phone Number: 07/25/2020, 11:07 AM  Clinical Narrative:     Spoke with pt concerning SNF for rehab. Pt agreed to be faxed out to SNF.   Expected Discharge Plan: [place] Barriers to Discharge: No Barriers Identified  Expected Discharge Plan and Services Expected Discharge Plan: [place] arrangements for the past 2 months: Single Family Home                                       Social Determinants of Health (SDOH) Interventions    Readmission Risk Interventions No flowsheet data found.

## 2020-07-25 NOTE — Plan of Care (Signed)

## 2020-07-25 NOTE — Clinical Social Work Note (Addendum)
30 Day Passar Note  RE: Jaime [name]         Date of Birth: 2032/02/04    Date: 07-25-20       To Whom It May Concern:  Please be advised that the above-named patient will require a short-term nursing home stay - anticipated 30 days or less for rehabilitation and strengthening.  The plan is for return home.   Evette Cristal, MSW, [place] [phone]

## 2020-07-25 NOTE — Care Management Important Message (Signed)
Important Message  Patient Details IM Letter given to the Patient Name: Jaime [name] MRN: [id number] Date of Birth: [birth year]   Medicare Important Message Given:  Yes     Kerin Salen 07/25/2020, 10:21 AM

## 2020-07-25 NOTE — Progress Notes (Signed)
ANTICOAGULATION CONSULT NOTE - Follow Up Consult  Pharmacy Consult for heparin --> Eliquis Indication: acute pulmonary embolus; age indeterminate left DVT  Allergies  Allergen Reactions  . Codeine     "makes me feel weird"  . Meperidine Hcl     Lowers blood pressure   . Adhesive [Tape] Rash  . Sulfonamide Derivatives Rash    Patient Measurements: Height: 5\' 5"  (165.1 cm) Weight: 90.7 kg (200 lb) IBW/kg (Calculated) : 57 Heparin Dosing Weight: 77 kg  Vital Signs: Temp: 98.2 F (36.8 C) (09/03 0542) Temp Source: Oral (09/03 0542) BP: 169/67 (09/03 0542) Pulse Rate: 56 (09/03 0542)  Labs: Recent Labs    07/22/20 0850 07/22/20 1440 07/22/20 1730 07/23/20 0454 07/23/20 1634 07/24/20 0049 07/24/20 0049 07/24/20 1029 07/24/20 1847 07/25/20 0452  HGB   < >  --   --  12.7  --  12.1  --   --   --  12.2  HCT   < >  --   --  39.8  --  38.6  --   --   --  38.2  PLT   < >  --   --  187  --  216  --   --   --  221  HEPARINUNFRC   < >  --  0.64 0.91*   < > 0.73*   < > 0.55 0.42 0.37  CREATININE  --   --   --  0.88  --  0.78  --   --   --  0.58  TROPONINIHS  --  1,076* 962*  --   --   --   --   --   --   --    < > = values in this interval not displayed.    Estimated Creatinine Clearance: 56.2 mL/min (by C-G formula based on SCr of 0.58 mg/dL).  Assessment: Patient is an 86 y.o F with COVID-19 presented to the ED on 8/30 with c/o SOB and syncopal event.  Chest CTA on 8/30 showed acute bilateral saddle PE with right heart strain.  LE doppler on 9/2 showed age indeterminate left DVT.  She was started on heparin drip on admission. Pharmacy is consulted on 9/3 to transition patient to Eliquis.  Today, 07/25/2020: - scr <1 (crcl~56) - cbc stable - no bleeding documented   Plan:  - d/c heparin drip - start Eliquis 10 mg bid x7 days, then 5 mg bid - monitor for s/sx bleeding - with stable renal function, pharmacy will sign off for Eliquis but will follow patient peripherally  along with you.  Dia Sitter P 07/25/2020,8:09 AM

## 2020-07-25 NOTE — Progress Notes (Signed)
PROGRESS NOTE    Jaime [name]  TIW:[id number] DOB: Nov 07, 1932 DOA: 07/21/2020 PCP: Crist Infante, MD    Brief Narrative:  86 year old female with history of osteoporosis, hyperlipidemia, vaccinated against COVID-19 presented to the ER with about 2 weeks of generalized malaise and weakness and muscle aches and poor oral intake and extreme fatigue.  She also had intermittent low-grade fever.  She was tested positive for COVID-19 on 8/23 after her daughter was tested positive a week ago.  Also has bilateral lower extremity swelling. In the emergency room, hemodynamically stable.  On 2 L oxygen.  CTA of the chest showed bilateral pulmonary emboli with saddle embolism and right heart strain.  Started on heparin infusion and admitted to the hospital.   Assessment & Plan:   Principal Problem:   COVID-19 virus infection Active Problems:   Mixed hyperlipidemia   Acute saddle pulmonary embolism with acute cor pulmonale (HCC)   Open-angle glaucoma   Acute respiratory failure with hypoxia (HCC)   Elevated troponin level not due myocardial infarction   Acute hypoxemic respiratory failure due to COVID-19 ([place])  Acute hypoxemic respiratory failure secondary to combination of COVID-19 pneumonia and acute saddle pulmonary embolism with acute cor pulmonale: Pulmonary embolism/left lower extremity multiple vein DVT. Patient is currently on room air to minimum oxygen requirement.  2D echocardiogram showed evidence of right heart strain.  Given patient's stability, as suggested by pulmonology patient is not catheter directed TPA candidate. Tolerating heparin.  Change to Eliquis today. Given saddle embolus, patient needs to be on lifelong anticoagulation.   COVID-19 pneumonia in a vaccinated patient: Does have evidence of bilateral pneumonia.  Hypoxic especially given saddle PE. Continue to monitor due to significant symptoms  chest physiotherapy, incentive spirometry, deep breathing exercises, sputum  induction, mucolytic's and bronchodilators. Supplemental oxygen to keep saturations more than 90%. Covid directed therapy with , steroids, on dexamethasone day 4/10. remdesivir, day 4/5 antibiotics not indicated Due to severity of symptoms, patient will need daily inflammatory markers, chest x-rays, liver function test to monitor and direct COVID-19 therapies.  COVID-19 Labs  No results for input(s): DDIMER, FERRITIN, LDH, CRP in the last 72 hours.  Lab Results  Component Value Date   SARSCOV2NAA POSITIVE (A) 07/21/2020   SpO2: 100 % O2 Flow Rate (L/min): 3 L/min   Elevated troponins: Due to acute PE.  Mobilize with PT OT.   DVT prophylaxis:  Heparin infusion   Code Status: DNR Family Communication: Daughter on the phone 9/2 Disposition Plan: Status is: Inpatient  Remains inpatient appropriate because:Hemodynamically unstable and Inpatient level of care appropriate due to severity of illness   Dispo: The patient is from: Home              Anticipated d/c is to: Skilled nursing facility.              Anticipated d/c date is: 2 to 3 days.              Patient currently is not medically stable to d/c.         Consultants:   Pulmonary,   Procedures:   None  Antimicrobials:  Antibiotics Given (last 72 hours)    Date/Time Action Medication Dose Rate   07/23/20 0937 New Bag/Given   remdesivir 100 mg in sodium chloride 0.9 % 100 mL IVPB 100 mg 200 mL/hr   07/24/20 1202 New Bag/Given   remdesivir 100 mg in sodium chloride 0.9 % 100 mL IVPB 100 mg 200 mL/hr  07/25/20 0929 New Bag/Given   remdesivir 100 mg in sodium chloride 0.9 % 100 mL IVPB 100 mg 200 mL/hr         Subjective: Patient was seen and examined. No overnight events. Her breathing is fine. She is very nervous about getting up due to feeling of dizziness. Tolerating heparin well.  Objective: Vitals:   07/24/20 0500 07/24/20 1446 07/24/20 2124 07/25/20 0542  BP: (!) 187/80 139/61 (!) 152/66  (!) 169/67  Pulse: 64 64 60 (!) 56  Resp:  (!) 22 20 20   Temp: 98 F (36.7 C) 98.6 F (37 C) 98.5 F (36.9 C) 98.2 F (36.8 C)  TempSrc: Axillary   Oral  SpO2: 99% 100% 100% 100%  Weight:      Height:        Intake/Output Summary (Last 24 hours) at 07/25/2020 1310 Last data filed at 07/25/2020 1100 Gross per 24 hour  Intake 296.43 ml  Output 900 ml  Net -603.57 ml   Filed Weights   07/21/20 1943  Weight: 90.7 kg    Examination:  General exam: Appears calm and comfortable  Chronically sick looking.  Not in any distress. Respiratory system: Clear to auscultation. Respiratory effort normal.  No added sounds.  On 3 L of oxygen. Cardiovascular system: S1 & S2 heard, RRR. No JVD, murmurs, rubs, gallops or clicks.  Bilateral 1+ pedal edema.   Gastrointestinal system: Abdomen is nondistended, soft and nontender. No organomegaly or masses felt. Normal bowel sounds heard. Central nervous system: Alert and oriented. No focal neurological deficits. Extremities: Symmetric 5 x 5 power. Skin: No rashes, lesions or ulcers Psychiatry: Judgement and insight appear normal. Mood & affect appropriate.     Data Reviewed: I have personally reviewed following labs and imaging studies  CBC: Recent Labs  Lab 07/21/20 2013 07/21/20 2013 07/22/20 7939 07/22/20 0850 07/23/20 0454 07/24/20 0049 07/25/20 0452  WBC 13.0*   < > 12.7* 12.9* 16.5* 15.7* 10.3  NEUTROABS 10.2*  --  10.6*  --   --   --   --   HGB 13.0   < > 12.6 12.5 12.7 12.1 12.2  HCT 40.3   < > 39.3 39.3 39.8 38.6 38.2  MCV 90.2   < > 90.3 89.9 89.2 91.7 90.3  PLT 186   < > 175 183 187 216 221   < > = values in this interval not displayed.   Basic Metabolic Panel: Recent Labs  Lab 07/21/20 2013 07/22/20 0442 07/23/20 0454 07/24/20 0049 07/25/20 0452  NA 136  --  138 139 140  K 3.8  --  4.2 4.1 3.5  CL 102  --  104 105 108  CO2 22  --  25 22 23   GLUCOSE 182*  --  128* 103* 132*  BUN 22  --  28* 24* 21  CREATININE  1.02*  --  0.88 0.78 0.58  CALCIUM 8.9  --  8.7* 8.3* 8.3*  MG  --  1.8  --   --   --   PHOS  --  3.1  --   --   --    GFR: Estimated Creatinine Clearance: 56.2 mL/min (by C-G formula based on SCr of 0.58 mg/dL). Liver Function Tests: Recent Labs  Lab 07/21/20 2013 07/23/20 0454 07/24/20 0049 07/25/20 0452  AST 27 26 23 21   ALT 15 13 13 13   ALKPHOS 66 58 51 54  BILITOT 0.6 0.8 0.7 0.5  PROT 8.0 7.8 7.0 6.7  ALBUMIN  3.4* 3.0* 2.8* 2.7*   Recent Labs  Lab 07/22/20 0442  LIPASE 28   No results for input(s): AMMONIA in the last 168 hours. Coagulation Profile: Recent Labs  Lab 07/22/20 0442  INR 1.3*   Cardiac Enzymes: No results for input(s): CKTOTAL, CKMB, CKMBINDEX, TROPONINI in the last 168 hours. BNP (last 3 results) No results for input(s): PROBNP in the last 8760 hours. HbA1C: No results for input(s): HGBA1C in the last 72 hours. CBG: No results for input(s): GLUCAP in the last 168 hours. Lipid Profile: No results for input(s): CHOL, HDL, LDLCALC, TRIG, CHOLHDL, LDLDIRECT in the last 72 hours. Thyroid Function Tests: No results for input(s): TSH, T4TOTAL, FREET4, T3FREE, THYROIDAB in the last 72 hours. Anemia Panel: No results for input(s): VITAMINB12, FOLATE, FERRITIN, TIBC, IRON, RETICCTPCT in the last 72 hours. Sepsis Labs: Recent Labs  Lab 07/21/20 2013  PROCALCITON <0.10  LATICACIDVEN 1.8    Recent Results (from the past 240 hour(s))  SARS Coronavirus 2 by RT PCR (hospital order, performed in [facility] hospital lab) Nasopharyngeal Nasopharyngeal Swab     Status: Abnormal   Collection Time: 07/21/20  8:13 PM   Specimen: Nasopharyngeal Swab  Result Value Ref Range Status   SARS Coronavirus 2 POSITIVE (A) NEGATIVE Final    Comment: RESULT CALLED TO, READ BACK BY AND VERIFIED WITH: RN Alanson Aly AT 2239 07/21/20 CRUICKSHANK A (NOTE) SARS-CoV-2 target nucleic acids are DETECTED  SARS-CoV-2 RNA is generally detectable in upper respiratory  specimens  during the acute phase of infection.  Positive results are indicative  of the presence of the identified virus, but do not rule out bacterial infection or co-infection with other pathogens not detected by the test.  Clinical correlation with patient history and  other diagnostic information is necessary to determine patient infection status.  The expected result is negative.  Fact Sheet for Patients:   StrictlyIdeas.no   Fact Sheet for Healthcare Providers:   BankingDealers.co.za    This test is not yet approved or cleared by the [place] FDA and  has been authorized for detection and/or diagnosis of SARS-CoV-2 by FDA under an Emergency Use Authorization (EUA).  This EUA will remain in effect (mean ing this test can be used) for the duration of  the COVID-19 declaration under Section 564(b)(1) of the Act, 21 U.S.C. section 360-bbb-3(b)(1), unless the authorization is terminated or revoked sooner.  Performed at [facility], [place] [address], [place], [place] [address]   Blood Culture (routine x 2)     Status: None (Preliminary result)   Collection Time: 07/21/20  8:13 PM   Specimen: BLOOD  Result Value Ref Range Status   Specimen Description   Final    BLOOD LEFT ANTECUBITAL Performed at [place] [address] [place], [place] [address]    Special Requests   Final    BOTTLES DRAWN AEROBIC AND ANAEROBIC Blood Culture adequate volume Performed at [place] [address], [place], [place] [address]    Culture   Final    NO GROWTH 4 DAYS Performed at [facility], [place] [address], [place], [place] [address]    Report Status PENDING  Incomplete  Blood Culture (routine x 2)     Status: Abnormal   Collection Time: 07/21/20  8:13 PM   Specimen: BLOOD RIGHT HAND  Result Value Ref Range Status   Specimen Description   Final    BLOOD RIGHT  HAND Performed at  [facility], [place] [address] [place], [place] [address]    Special Requests   Final    BOTTLES DRAWN AEROBIC AND ANAEROBIC Blood Culture results may not be optimal due to an inadequate volume of blood received in culture bottles Performed at [place] [address] [place], [place] [address]    Culture  Setup Time   Final    GRAM POSITIVE COCCI IN CLUSTERS IN BOTH AEROBIC AND ANAEROBIC BOTTLES CRITICAL RESULT CALLED TO, READ BACK BY AND VERIFIED WITH: Sheffield Slider [facility] 07/23/20 0125 JDW    Culture (A)  Final    STAPHYLOCOCCUS EPIDERMIDIS THE SIGNIFICANCE OF ISOLATING THIS ORGANISM FROM A SINGLE SET OF BLOOD CULTURES WHEN MULTIPLE SETS ARE DRAWN IS UNCERTAIN. PLEASE NOTIFY THE MICROBIOLOGY DEPARTMENT WITHIN ONE WEEK IF SPECIATION AND SENSITIVITIES ARE REQUIRED. Performed at [facility], [place] [address] [place], [place] [address]    Report Status 07/24/2020 FINAL  Final  Blood Culture ID Panel (Reflexed)     Status: Abnormal   Collection Time: 07/21/20  8:13 PM  Result Value Ref Range Status   Enterococcus faecalis NOT DETECTED NOT DETECTED Final   Enterococcus Faecium NOT DETECTED NOT DETECTED Final   Listeria monocytogenes NOT DETECTED NOT DETECTED Final   Staphylococcus species DETECTED (A) NOT DETECTED Final    Comment: CRITICAL RESULT CALLED TO, READ BACK BY AND VERIFIED WITH: Sheffield Slider [facility] 07/23/20 0125    Staphylococcus aureus (BCID) NOT DETECTED NOT DETECTED Final   Staphylococcus epidermidis DETECTED (A) NOT DETECTED Final    Comment: Methicillin (oxacillin) resistant coagulase negative staphylococcus. Possible blood culture contaminant (unless isolated from more than one blood culture draw or clinical case suggests pathogenicity). No antibiotic treatment is indicated for blood  culture contaminants. CRITICAL RESULT CALLED TO, READ BACK BY AND VERIFIED WITH: Sheffield Slider [facility] 07/23/20 0125 JDW    Staphylococcus  lugdunensis NOT DETECTED NOT DETECTED Final   Streptococcus species NOT DETECTED NOT DETECTED Final   Streptococcus agalactiae NOT DETECTED NOT DETECTED Final   Streptococcus pneumoniae NOT DETECTED NOT DETECTED Final   Streptococcus pyogenes NOT DETECTED NOT DETECTED Final   A.calcoaceticus-baumannii NOT DETECTED NOT DETECTED Final   Bacteroides fragilis NOT DETECTED NOT DETECTED Final   Enterobacterales NOT DETECTED NOT DETECTED Final   Enterobacter cloacae complex NOT DETECTED NOT DETECTED Final   Escherichia coli NOT DETECTED NOT DETECTED Final   Klebsiella aerogenes NOT DETECTED NOT DETECTED Final   Klebsiella oxytoca NOT DETECTED NOT DETECTED Final   Klebsiella pneumoniae NOT DETECTED NOT DETECTED Final   Proteus species NOT DETECTED NOT DETECTED Final   Salmonella species NOT DETECTED NOT DETECTED Final   Serratia marcescens NOT DETECTED NOT DETECTED Final   Haemophilus influenzae NOT DETECTED NOT DETECTED Final   Neisseria meningitidis NOT DETECTED NOT DETECTED Final   Pseudomonas aeruginosa NOT DETECTED NOT DETECTED Final   Stenotrophomonas maltophilia NOT DETECTED NOT DETECTED Final   Candida albicans NOT DETECTED NOT DETECTED Final   Candida auris NOT DETECTED NOT DETECTED Final   Candida glabrata NOT DETECTED NOT DETECTED Final   Candida krusei NOT DETECTED NOT DETECTED Final   Candida parapsilosis NOT DETECTED NOT DETECTED Final   Candida tropicalis NOT DETECTED NOT DETECTED Final   Cryptococcus neoformans/gattii NOT DETECTED NOT DETECTED Final   Methicillin resistance mecA/C DETECTED (A) NOT DETECTED Final    Comment: CRITICAL RESULT CALLED TO, READ BACK BY AND VERIFIED WITH: Sheffield Slider [facility] 07/23/20 0125 JDW Performed at [facility], [address] [place], [place] [address]          Radiology Studies: VAS [place] LOWER EXTREMITY VENOUS (DVT)  Result Date: 07/24/2020  Lower Venous DVTStudy Indications: Pulmonary embolism.  Comparison Study: no prior  Performing Technologist: Abram Sander RVS  Examination Guidelines: A complete evaluation includes B-mode imaging, spectral Doppler, color Doppler, and power Doppler as needed of all accessible portions of each vessel. Bilateral testing is considered an integral part of a complete examination. Limited examinations for reoccurring indications may be performed as noted. The reflux portion of the exam is performed with the patient in reverse Trendelenburg.  +---------+---------------+---------+-----------+----------+--------------+ RIGHT    CompressibilityPhasicitySpontaneityPropertiesThrombus Aging +---------+---------------+---------+-----------+----------+--------------+ CFV      Full           Yes      Yes                                 +---------+---------------+---------+-----------+----------+--------------+ SFJ      Full                                                        +---------+---------------+---------+-----------+----------+--------------+ FV Prox  Full                                                        +---------+---------------+---------+-----------+----------+--------------+ FV Mid   Full                                                        +---------+---------------+---------+-----------+----------+--------------+ FV DistalFull                                                        +---------+---------------+---------+-----------+----------+--------------+ PFV      Full                                                        +---------+---------------+---------+-----------+----------+--------------+ POP      Full           Yes      Yes                                 +---------+---------------+---------+-----------+----------+--------------+ PTV      Full                                                        +---------+---------------+---------+-----------+----------+--------------+ PERO  Not visualized +---------+---------------+---------+-----------+----------+--------------+   +---------+---------------+---------+-----------+----------+-----------------+ LEFT     CompressibilityPhasicitySpontaneityPropertiesThrombus Aging    +---------+---------------+---------+-----------+----------+-----------------+ CFV      Full           Yes      Yes                                    +---------+---------------+---------+-----------+----------+-----------------+ SFJ      Full                                                           +---------+---------------+---------+-----------+----------+-----------------+ FV Prox  Full                                                           +---------+---------------+---------+-----------+----------+-----------------+ FV Mid   None                                         Age Indeterminate +---------+---------------+---------+-----------+----------+-----------------+ FV DistalNone                                         Age Indeterminate +---------+---------------+---------+-----------+----------+-----------------+ PFV      Full                                                           +---------+---------------+---------+-----------+----------+-----------------+ POP      None           No       No                   Age Indeterminate +---------+---------------+---------+-----------+----------+-----------------+ PTV      None                                         Age Indeterminate +---------+---------------+---------+-----------+----------+-----------------+ PERO                                                  Not visualized    +---------+---------------+---------+-----------+----------+-----------------+     Summary: RIGHT: - There is no evidence of deep vein thrombosis in the lower extremity.  - No cystic structure found in the popliteal fossa.  LEFT: - Findings consistent with age  indeterminate deep vein thrombosis involving the left popliteal vein, and left posterior tibial veins. - No cystic structure found in the popliteal fossa.  *See table(s) above for measurements and observations. Electronically signed by Servando Snare MD on 07/24/2020 at 4:50:49 PM.    Final  Scheduled Meds: . albuterol  2 puff Inhalation Q6H  . apixaban  10 mg Oral BID   Followed by  . [START ON 08/01/2020] apixaban  5 mg Oral BID  . vitamin C  500 mg Oral Daily  . dexamethasone (DECADRON) injection  6 mg Intravenous Q24H  . dorzolamide-timolol  1 drop Both Eyes BID  . latanoprost  1 drop Both Eyes QHS  . loratadine  10 mg Oral Daily  . rosuvastatin  2.5 mg Oral Daily  . zinc sulfate  220 mg Oral Daily   Continuous Infusions: . remdesivir 100 mg in NS 100 mL 100 mg (07/25/20 0929)     LOS: 3 days    Time spent: 30 minutes    Barb Merino, MD [facility] Pager [phone]

## 2020-07-25 NOTE — NC FL2 (Signed)
[place] LEVEL OF CARE SCREENING TOOL     IDENTIFICATION  Patient Name: Jaime [name] Birthdate: 10-17-1932 Sex: female Admission Date (Current Location): 07/21/2020  [facility] and [place] Number:  Herbalist and Address:  [facility],  [place] [address] [place]      Provider Number: [id number]  Attending Physician Name and Address:  Barb Merino, MD  Relative Name and Phone Number:  [name] [phone]    Current Level of Care: Hospital Recommended Level of Care: [place] Prior Approval Number:    Date Approved/Denied:   PASRR Number:    Discharge Plan: SNF    Current Diagnoses: Patient Active Problem List   Diagnosis Date Noted  . Acute saddle pulmonary embolism with acute cor pulmonale ([place]) 07/22/2020  . Open-angle glaucoma 07/22/2020  . Acute respiratory failure with hypoxia ([place]) 07/22/2020  . COVID-19 virus infection 07/22/2020  . Elevated troponin level not due myocardial infarction 07/22/2020  . Acute hypoxemic respiratory failure due to COVID-19 ([place]) 07/22/2020  . Acute blood loss anemia 02/28/2014  . Osteoarthritis of right knee 02/27/2014  . Total knee replacement status 02/27/2014  . Mixed hyperlipidemia 02/13/2009  . ALLERGIC RHINITIS 02/13/2009  . PLEURAL EFFUSION, RIGHT 02/13/2009  . PULMONARY NODULE 02/13/2009    Orientation RESPIRATION BLADDER Height & Weight     Self, Time, Situation, Place  O2 (3L O2) External catheter Weight: 90.7 kg Height:  5\' 5"  (165.1 cm)  BEHAVIORAL SYMPTOMS/MOOD NEUROLOGICAL BOWEL NUTRITION STATUS      Continent Diet (Regular diet)  AMBULATORY STATUS COMMUNICATION OF NEEDS Skin   Extensive Assist   Bruising (arms)                       Personal Care Assistance Level of Assistance  Bathing, Feeding, Dressing Bathing Assistance: Limited assistance Feeding assistance: Independent Dressing Assistance: Limited assistance     Functional  Limitations Info  Sight, Hearing, Speech Sight Info: Adequate Hearing Info: Adequate Speech Info: Adequate    SPECIAL CARE FACTORS FREQUENCY  PT (By licensed PT), OT (By licensed OT)     PT Frequency: 5x week OT Frequency: 5x week            Contractures Contractures Info: Not present    Additional Factors Info  Code Status, Allergies Code Status Info: DNR Allergies Info: Codeine, Meperidine Hcl, Adhesive Tape, Sulfonamide Derivatives           Current Medications (07/25/2020):  This is the current hospital active medication list Current Facility-Administered Medications  Medication Dose Route Frequency Provider Last Rate Last Admin  . acetaminophen (TYLENOL) tablet 650 mg  650 mg Oral Q6H PRN Vernelle Emerald, MD   650 mg at 07/22/20 0850  . albuterol (VENTOLIN HFA) 108 (90 Base) MCG/ACT inhaler 2 puff  2 puff Inhalation Q6H Shalhoub, Sherryll [place], MD   2 puff at 07/25/20 0925  . albuterol (VENTOLIN HFA) 108 (90 Base) MCG/ACT inhaler 2 puff  2 puff Inhalation Q4H PRN Shalhoub, Sherryll Burger, MD   2 puff at 07/23/20 2115  . apixaban (ELIQUIS) tablet 10 mg  10 mg Oral BID Lynelle Doctor, RPH   10 mg at 07/25/20 0924   Followed by  . [START ON 08/01/2020] apixaban (ELIQUIS) tablet 5 mg  5 mg Oral BID Lynelle Doctor, RPH      . ascorbic acid (VITAMIN C) tablet 500 mg  500 mg Oral Daily Shalhoub, Sherryll [place], MD  500 mg at 07/25/20 0924  . dexamethasone (DECADRON) injection 6 mg  6 mg Intravenous Q24H Shalhoub, Sherryll Burger, MD   6 mg at 07/25/20 0204  . dorzolamide-timolol (COSOPT) 22.3-6.8 MG/ML ophthalmic solution 1 drop  1 drop Both Eyes BID Shalhoub, Sherryll Burger, MD   1 drop at 07/25/20 0930  . fluticasone (FLONASE) 50 MCG/ACT nasal spray 1 spray  1 spray Each Nare Daily PRN Shalhoub, Sherryll Burger, MD      . guaiFENesin-dextromethorphan (ROBITUSSIN DM) 100-10 MG/5ML syrup 10 mL  10 mL Oral Q4H PRN Shalhoub, Sherryll Burger, MD      . latanoprost (XALATAN) 0.005 % ophthalmic solution 1 drop  1 drop Both Eyes  QHS Shalhoub, Sherryll Burger, MD   1 drop at 07/24/20 2132  . loratadine (CLARITIN) tablet 10 mg  10 mg Oral Daily Shalhoub, Sherryll Burger, MD   10 mg at 07/25/20 0925  . ondansetron (ZOFRAN) tablet 4 mg  4 mg Oral Q6H PRN Shalhoub, Sherryll Burger, MD       Or  . ondansetron [facility]) injection 4 mg  4 mg Intravenous Q6H PRN Shalhoub, Sherryll Burger, MD      . polyethylene glycol (MIRALAX / GLYCOLAX) packet 17 g  17 g Oral Daily PRN Shalhoub, Sherryll Burger, MD      . remdesivir 100 mg in sodium chloride 0.9 % 100 mL IVPB  100 mg Intravenous Daily Shalhoub, Sherryll Burger, MD 200 mL/hr at 07/25/20 0929 100 mg at 07/25/20 0929  . rosuvastatin (CRESTOR) tablet 2.5 mg  2.5 mg Oral Daily Shalhoub, Sherryll Burger, MD   2.5 mg at 07/25/20 0925  . zinc sulfate capsule 220 mg  220 mg Oral Daily Shalhoub, Sherryll Burger, MD   220 mg at 07/25/20 7116     Discharge Medications: Please see discharge summary for a list of discharge medications.  Relevant Imaging Results:  Relevant Lab Results:   Additional Information [id number]  Purcell Mouton, RN

## 2020-07-25 NOTE — TOC Progression Note (Signed)
Transition of Care [facility]) - Progression Note    Patient Details  Name: NIKOLE SWARTZENTRUBER MRN: [id number] Date of Birth: Feb 22, 1932  [facility] CM/SW Contact  Purcell Mouton, RN Phone Number: 07/25/2020, 3:40 PM  Clinical Narrative:    Rosalie Gums [id number]   Expected Discharge Plan: [place] Barriers to Discharge: No Barriers Identified  Expected Discharge Plan and Services Expected Discharge Plan: [place] arrangements for the past 2 months: Single Family Home                                       Social Determinants of Health (SDOH) Interventions    Readmission Risk Interventions No flowsheet data found.

## 2020-07-25 NOTE — Discharge Instructions (Signed)
Information on my medicine - ELIQUIS (apixaban)  This medication education was reviewed with me or my healthcare representative as part of my discharge preparation.    Why was Eliquis prescribed for you? Eliquis was prescribed to treat blood clots that may have been found in the veins of your legs (deep vein thrombosis) or in your lungs (pulmonary embolism) and to reduce the risk of them occurring again.  What do You need to know about Eliquis ? The starting dose is 10 mg (two 5 mg tablets) taken TWICE daily for the FIRST SEVEN (7) DAYS, then on 08/01/20  the dose is reduced to ONE 5 mg tablet taken TWICE daily.  Eliquis may be taken with or without food.   Try to take the dose about the same time in the morning and in the evening. If you have difficulty swallowing the tablet whole please discuss with your pharmacist how to take the medication safely.  Take Eliquis exactly as prescribed and DO NOT stop taking Eliquis without talking to the doctor who prescribed the medication.  Stopping may increase your risk of developing a new blood clot.  Refill your prescription before you run out.  After discharge, you should have regular check-up appointments with your healthcare provider that is prescribing your Eliquis.    What do you do if you miss a dose? If a dose of ELIQUIS is not taken at the scheduled time, take it as soon as possible on the same day and twice-daily administration should be resumed. The dose should not be doubled to make up for a missed dose.  Important Safety Information A possible side effect of Eliquis is bleeding. You should call your healthcare provider right away if you experience any of the following: ? Bleeding from an injury or your nose that does not stop. ? Unusual colored urine (red or dark brown) or unusual colored stools (red or black). ? Unusual bruising for unknown reasons. ? A serious fall or if you hit your head (even if there is no bleeding).  Some  medicines may interact with Eliquis and might increase your risk of bleeding or clotting while on Eliquis. To help avoid this, consult your healthcare provider or pharmacist prior to using any new prescription or non-prescription medications, including herbals, vitamins, non-steroidal anti-inflammatory drugs (NSAIDs) and supplements.  This website has more information on Eliquis (apixaban): http://www.eliquis.com/eliquis/home

## 2020-07-26 LAB — COMPREHENSIVE METABOLIC PANEL
ALT: 14 U/L (ref 0–44)
AST: 19 U/L (ref 15–41)
Albumin: 2.5 g/dL — ABNORMAL LOW (ref 3.5–5.0)
Alkaline Phosphatase: 51 U/L (ref 38–126)
Anion gap: 8 (ref 5–15)
BUN: 22 mg/dL (ref 8–23)
CO2: 25 mmol/L (ref 22–32)
Calcium: 8.1 mg/dL — ABNORMAL LOW (ref 8.9–10.3)
Chloride: 107 mmol/L (ref 98–111)
Creatinine, Ser: 0.67 mg/dL (ref 0.44–1.00)
GFR calc Af Amer: >60 mL/min (ref >60)
GFR calc non Af Amer: >60 mL/min (ref >60)
Glucose, Bld: 108 mg/dL — ABNORMAL HIGH (ref 70–99)
Potassium: 3.4 mmol/L — ABNORMAL LOW (ref 3.5–5.1)
Sodium: 140 mmol/L (ref 135–145)
Total Bilirubin: 0.6 mg/dL (ref 0.3–1.2)
Total Protein: 6.4 g/dL — ABNORMAL LOW (ref 6.5–8.1)

## 2020-07-26 LAB — CULTURE, BLOOD (ROUTINE X 2)
Culture: NO GROWTH
Special Requests: ADEQUATE

## 2020-07-26 MED ORDER — POTASSIUM CHLORIDE CRYS ER 20 MEQ PO TBCR
20.0000 meq | EXTENDED_RELEASE_TABLET | Freq: Two times a day (BID) | ORAL | Status: DC
  Administered 2020-07-26 – 2020-07-27 (×3): 20 meq via ORAL
  Filled 2020-07-26 (×3): qty 1

## 2020-07-26 NOTE — Progress Notes (Signed)
PROGRESS NOTE    PAIJE GOODHART  CXK:[id number] DOB: 1931/12/19 DOA: 07/21/2020 PCP: Crist Infante, MD    Brief Narrative:  86 year old female with history of osteoporosis, hyperlipidemia, vaccinated against COVID-19 presented to the ER with about 2 weeks of generalized malaise and weakness and muscle aches and poor oral intake and extreme fatigue.  She also had intermittent low-grade fever.  She was tested positive for COVID-19 on 8/23 after her daughter was tested positive a week ago.  Also has bilateral lower extremity swelling. In the emergency room, hemodynamically stable.  On 2 L oxygen.  CTA of the chest showed bilateral pulmonary emboli with saddle embolism and right heart strain.  Started on heparin infusion and admitted to the hospital.   Assessment & Plan:   Principal Problem:   COVID-19 virus infection Active Problems:   Mixed hyperlipidemia   Acute saddle pulmonary embolism with acute cor pulmonale (HCC)   Open-angle glaucoma   Acute respiratory failure with hypoxia (HCC)   Elevated troponin level not due myocardial infarction   Acute hypoxemic respiratory failure due to COVID-19 ([place])  Acute hypoxemic respiratory failure secondary to combination of COVID-19 pneumonia and acute saddle pulmonary embolism with acute cor pulmonale: Pulmonary embolism/left lower extremity multiple vein DVT. Patient is currently on room air to minimum oxygen requirement.  2D echocardiogram showed evidence of right heart strain.  Given patient's stability, as suggested by pulmonology patient is not catheter directed TPA candidate. Tolerating heparin.  Change to Eliquis  Given saddle embolus, patient needs to be on lifelong anticoagulation.   COVID-19 pneumonia in a vaccinated patient: Does have evidence of bilateral pneumonia.  Hypoxic especially given saddle PE. Continue to monitor due to significant symptoms  chest physiotherapy, incentive spirometry, deep breathing exercises, sputum  induction, mucolytic's and bronchodilators. Supplemental oxygen to keep saturations more than 90%. Covid directed therapy with , steroids, on dexamethasone day 5/10. remdesivir, day 5/5 antibiotics not indicated Due to severity of symptoms, patient will need daily inflammatory markers, chest x-rays, liver function test to monitor and direct COVID-19 therapies.  COVID-19 Labs  No results for input(s): DDIMER, FERRITIN, LDH, CRP in the last 72 hours.  Lab Results  Component Value Date   SARSCOV2NAA POSITIVE (A) 07/21/2020   SpO2: 99 % O2 Flow Rate (L/min): 2 L/min   Elevated troponins: Due to acute PE.  Mobilize with PT OT.  Refer to skilled nursing facility for short-term rehab.   DVT prophylaxis:  Eliquis    Code Status: DNR Family Communication: Daughter called , unable to pick up phone . Disposition Plan: Status is: Inpatient  Remains inpatient appropriate because:Hemodynamically unstable and Inpatient level of care appropriate due to severity of illness   Dispo: The patient is from: Home              Anticipated d/c is to: Skilled nursing facility.              Anticipated d/c date is: When bed is available.              Patient currently is medically stable.         Consultants:   Pulmonary,   Procedures:   None  Antimicrobials:  Antibiotics Given (last 72 hours)    Date/Time Action Medication Dose Rate   07/24/20 1202 New Bag/Given   remdesivir 100 mg in sodium chloride 0.9 % 100 mL IVPB 100 mg 200 mL/hr   07/25/20 0929 New Bag/Given   remdesivir 100 mg in sodium chloride 0.9 %  100 mL IVPB 100 mg 200 mL/hr   07/26/20 1125 New Bag/Given   remdesivir 100 mg in sodium chloride 0.9 % 100 mL IVPB 100 mg 200 mL/hr         Subjective: Patient seen and examined.  No overnight events.  Mostly on room air and occasionally on 2 L oxygen.  Still has some more dizziness on getting up, however better.  Objective: Vitals:   07/25/20 1600 07/25/20 2007  07/26/20 0004 07/26/20 0411  BP: (!) 158/62 (!) 149/67 (!) 158/84 (!) 160/65  Pulse: 64 (!) 59 (!) 58 (!) 56  Resp: 18 (!) 25 19 (!) 23  Temp: 97.7 F (36.5 C) (!) 97.5 F (36.4 C) 98.9 F (37.2 C) 97.7 F (36.5 C)  TempSrc: Oral Oral  Oral  SpO2: 96% 98% 97% 99%  Weight:      Height:        Intake/Output Summary (Last 24 hours) at 07/26/2020 1327 Last data filed at 07/26/2020 0600 Gross per 24 hour  Intake 240 ml  Output 600 ml  Net -360 ml   Filed Weights   07/21/20 1943  Weight: 90.7 kg    Examination:  General exam: Appears calm and comfortable  Chronically sick looking.  Not in any distress. Respiratory system: Clear to auscultation. Respiratory effort normal.  No added sounds.  On 3 L of oxygen. Cardiovascular system: S1 & S2 heard, RRR. No JVD, murmurs, rubs, gallops or clicks.  Bilateral 1+ pedal edema.   Gastrointestinal system: Abdomen is nondistended, soft and nontender. No organomegaly or masses felt. Normal bowel sounds heard. Central nervous system: Alert and oriented. No focal neurological deficits. Extremities: Symmetric 5 x 5 power. Skin: No rashes, lesions or ulcers Psychiatry: Judgement and insight appear normal. Mood & affect appropriate.     Data Reviewed: I have personally reviewed following labs and imaging studies  CBC: Recent Labs  Lab 07/21/20 2013 07/21/20 2013 07/22/20 6761 07/22/20 0850 07/23/20 0454 07/24/20 0049 07/25/20 0452  WBC 13.0*   < > 12.7* 12.9* 16.5* 15.7* 10.3  NEUTROABS 10.2*  --  10.6*  --   --   --   --   HGB 13.0   < > 12.6 12.5 12.7 12.1 12.2  HCT 40.3   < > 39.3 39.3 39.8 38.6 38.2  MCV 90.2   < > 90.3 89.9 89.2 91.7 90.3  PLT 186   < > 175 183 187 216 221   < > = values in this interval not displayed.   Basic Metabolic Panel: Recent Labs  Lab 07/21/20 2013 07/22/20 0442 07/23/20 0454 07/24/20 0049 07/25/20 0452 07/26/20 0329  NA 136  --  138 139 140 140  K 3.8  --  4.2 4.1 3.5 3.4*  CL 102  --  104  105 108 107  CO2 22  --  25 22 23 25   GLUCOSE 182*  --  128* 103* 132* 108*  BUN 22  --  28* 24* 21 22  CREATININE 1.02*  --  0.88 0.78 0.58 0.67  CALCIUM 8.9  --  8.7* 8.3* 8.3* 8.1*  MG  --  1.8  --   --   --   --   PHOS  --  3.1  --   --   --   --    GFR: Estimated Creatinine Clearance: 56.2 mL/min (by C-G formula based on SCr of 0.67 mg/dL). Liver Function Tests: Recent Labs  Lab 07/21/20 2013 07/23/20 9509 07/24/20 0049 07/25/20  7096 07/26/20 0329  AST 27 26 23 21 19   ALT 15 13 13 13 14   ALKPHOS 66 58 51 54 51  BILITOT 0.6 0.8 0.7 0.5 0.6  PROT 8.0 7.8 7.0 6.7 6.4*  ALBUMIN 3.4* 3.0* 2.8* 2.7* 2.5*   Recent Labs  Lab 07/22/20 0442  LIPASE 28   No results for input(s): AMMONIA in the last 168 hours. Coagulation Profile: Recent Labs  Lab 07/22/20 0442  INR 1.3*   Cardiac Enzymes: No results for input(s): CKTOTAL, CKMB, CKMBINDEX, TROPONINI in the last 168 hours. BNP (last 3 results) No results for input(s): PROBNP in the last 8760 hours. HbA1C: No results for input(s): HGBA1C in the last 72 hours. CBG: No results for input(s): GLUCAP in the last 168 hours. Lipid Profile: No results for input(s): CHOL, HDL, LDLCALC, TRIG, CHOLHDL, LDLDIRECT in the last 72 hours. Thyroid Function Tests: No results for input(s): TSH, T4TOTAL, FREET4, T3FREE, THYROIDAB in the last 72 hours. Anemia Panel: No results for input(s): VITAMINB12, FOLATE, FERRITIN, TIBC, IRON, RETICCTPCT in the last 72 hours. Sepsis Labs: Recent Labs  Lab 07/21/20 2013  PROCALCITON <0.10  LATICACIDVEN 1.8    Recent Results (from the past 240 hour(s))  SARS Coronavirus 2 by RT PCR (hospital order, performed in [facility] hospital lab) Nasopharyngeal Nasopharyngeal Swab     Status: Abnormal   Collection Time: 07/21/20  8:13 PM   Specimen: Nasopharyngeal Swab  Result Value Ref Range Status   SARS Coronavirus 2 POSITIVE (A) NEGATIVE Final    Comment: RESULT CALLED TO, READ BACK BY AND VERIFIED  WITH: RN Alanson Aly AT 2239 07/21/20 CRUICKSHANK A (NOTE) SARS-CoV-2 target nucleic acids are DETECTED  SARS-CoV-2 RNA is generally detectable in upper respiratory specimens  during the acute phase of infection.  Positive results are indicative  of the presence of the identified virus, but do not rule out bacterial infection or co-infection with other pathogens not detected by the test.  Clinical correlation with patient history and  other diagnostic information is necessary to determine patient infection status.  The expected result is negative.  Fact Sheet for Patients:   StrictlyIdeas.no   Fact Sheet for Healthcare Providers:   BankingDealers.co.za    This test is not yet approved or cleared by the [place] FDA and  has been authorized for detection and/or diagnosis of SARS-CoV-2 by FDA under an Emergency Use Authorization (EUA).  This EUA will remain in effect (mean ing this test can be used) for the duration of  the COVID-19 declaration under Section 564(b)(1) of the Act, 21 U.S.C. section 360-bbb-3(b)(1), unless the authorization is terminated or revoked sooner.  Performed at [facility], [place] [address], [place], [place] [address]   Blood Culture (routine x 2)     Status: None (Preliminary result)   Collection Time: 07/21/20  8:13 PM   Specimen: BLOOD  Result Value Ref Range Status   Specimen Description   Final    BLOOD LEFT ANTECUBITAL Performed at [facility] [address], [place], [place] [address]    Special Requests   Final    BOTTLES DRAWN AEROBIC AND ANAEROBIC Blood Culture adequate volume Performed at [facility] [address] [place], [place] [address]    Culture   Final    NO GROWTH 4 DAYS Performed at [facility], [place] [address], [place], [place] [address]    Report Status PENDING  Incomplete  Blood Culture (routine x 2)  Status: Abnormal   Collection Time: 07/21/20  8:13 PM   Specimen: BLOOD RIGHT HAND  Result Value Ref Range Status   Specimen Description   Final    BLOOD RIGHT HAND Performed at [facility], [address], [place], [place] [address]    Special Requests   Final    BOTTLES DRAWN AEROBIC AND ANAEROBIC Blood Culture results may not be optimal due to an inadequate volume of blood received in culture bottles Performed at [place] [address] [place], [place] [address]    Culture  Setup Time   Final    GRAM POSITIVE COCCI IN CLUSTERS IN BOTH AEROBIC AND ANAEROBIC BOTTLES CRITICAL RESULT CALLED TO, READ BACK BY AND VERIFIED WITH: Sheffield Slider [facility] 07/23/20 0125 JDW    Culture (A)  Final    STAPHYLOCOCCUS EPIDERMIDIS THE SIGNIFICANCE OF ISOLATING THIS ORGANISM FROM A SINGLE SET OF BLOOD CULTURES WHEN MULTIPLE SETS ARE DRAWN IS UNCERTAIN. PLEASE NOTIFY THE MICROBIOLOGY DEPARTMENT WITHIN ONE WEEK IF SPECIATION AND SENSITIVITIES ARE REQUIRED. Performed at [facility], [place] [address] [place], [place] [address]    Report Status 07/24/2020 FINAL  Final  Blood Culture ID Panel (Reflexed)     Status: Abnormal   Collection Time: 07/21/20  8:13 PM  Result Value Ref Range Status   Enterococcus faecalis NOT DETECTED NOT DETECTED Final   Enterococcus Faecium NOT DETECTED NOT DETECTED Final   Listeria monocytogenes NOT DETECTED NOT DETECTED Final   Staphylococcus species DETECTED (A) NOT DETECTED Final    Comment: CRITICAL RESULT CALLED TO, READ BACK BY AND VERIFIED WITH: Sheffield Slider [facility] 07/23/20 0125    Staphylococcus aureus (BCID) NOT DETECTED NOT DETECTED Final   Staphylococcus epidermidis DETECTED (A) NOT DETECTED Final    Comment: Methicillin (oxacillin) resistant coagulase negative staphylococcus. Possible blood culture contaminant (unless isolated from more than one blood culture draw or clinical case suggests pathogenicity). No antibiotic treatment is  indicated for blood  culture contaminants. CRITICAL RESULT CALLED TO, READ BACK BY AND VERIFIED WITH: Sheffield Slider [facility] 07/23/20 0125 JDW    Staphylococcus lugdunensis NOT DETECTED NOT DETECTED Final   Streptococcus species NOT DETECTED NOT DETECTED Final   Streptococcus agalactiae NOT DETECTED NOT DETECTED Final   Streptococcus pneumoniae NOT DETECTED NOT DETECTED Final   Streptococcus pyogenes NOT DETECTED NOT DETECTED Final   A.calcoaceticus-baumannii NOT DETECTED NOT DETECTED Final   Bacteroides fragilis NOT DETECTED NOT DETECTED Final   Enterobacterales NOT DETECTED NOT DETECTED Final   Enterobacter cloacae complex NOT DETECTED NOT DETECTED Final   Escherichia coli NOT DETECTED NOT DETECTED Final   Klebsiella aerogenes NOT DETECTED NOT DETECTED Final   Klebsiella oxytoca NOT DETECTED NOT DETECTED Final   Klebsiella pneumoniae NOT DETECTED NOT DETECTED Final   Proteus species NOT DETECTED NOT DETECTED Final   Salmonella species NOT DETECTED NOT DETECTED Final   Serratia marcescens NOT DETECTED NOT DETECTED Final   Haemophilus influenzae NOT DETECTED NOT DETECTED Final   Neisseria meningitidis NOT DETECTED NOT DETECTED Final   Pseudomonas aeruginosa NOT DETECTED NOT DETECTED Final   Stenotrophomonas maltophilia NOT DETECTED NOT DETECTED Final   Candida albicans NOT DETECTED NOT DETECTED Final   Candida auris NOT DETECTED NOT DETECTED Final   Candida glabrata NOT DETECTED NOT DETECTED Final   Candida krusei NOT DETECTED NOT DETECTED Final   Candida parapsilosis NOT DETECTED NOT DETECTED Final   Candida tropicalis NOT DETECTED NOT DETECTED Final   Cryptococcus neoformans/gattii NOT DETECTED NOT  DETECTED Final   Methicillin resistance mecA/C DETECTED (A) NOT DETECTED Final    Comment: CRITICAL RESULT CALLED TO, READ BACK BY AND VERIFIED WITH: Sheffield Slider [facility] 07/23/20 0125 JDW Performed at [facility], [address], [place], [place] [address]          Radiology  Studies: No results found.      Scheduled Meds:  albuterol  2 puff Inhalation Q6H   apixaban  10 mg Oral BID   Followed by   Derrill Memo ON 08/01/2020] apixaban  5 mg Oral BID   vitamin C  500 mg Oral Daily   dexamethasone (DECADRON) injection  6 mg Intravenous Q24H   dorzolamide-timolol  1 drop Both Eyes BID   latanoprost  1 drop Both Eyes QHS   loratadine  10 mg Oral Daily   potassium chloride  20 mEq Oral BID   rosuvastatin  2.5 mg Oral Daily   zinc sulfate  220 mg Oral Daily   Continuous Infusions:    LOS: 4 days    Time spent: 30 minutes    Barb Merino, MD [facility] Pager [phone]

## 2020-07-27 LAB — COMPREHENSIVE METABOLIC PANEL
ALT: 18 U/L (ref 0–44)
AST: 25 U/L (ref 15–41)
Albumin: 2.6 g/dL — ABNORMAL LOW (ref 3.5–5.0)
Alkaline Phosphatase: 53 U/L (ref 38–126)
Anion gap: 10 (ref 5–15)
BUN: 21 mg/dL (ref 8–23)
CO2: 25 mmol/L (ref 22–32)
Calcium: 8.4 mg/dL — ABNORMAL LOW (ref 8.9–10.3)
Chloride: 110 mmol/L (ref 98–111)
Creatinine, Ser: 0.67 mg/dL (ref 0.44–1.00)
GFR calc Af Amer: >60 mL/min (ref >60)
GFR calc non Af Amer: >60 mL/min (ref >60)
Glucose, Bld: 117 mg/dL — ABNORMAL HIGH (ref 70–99)
Potassium: 5 mmol/L (ref 3.5–5.1)
Sodium: 145 mmol/L (ref 135–145)
Total Bilirubin: 0.6 mg/dL (ref 0.3–1.2)
Total Protein: 6.4 g/dL — ABNORMAL LOW (ref 6.5–8.1)

## 2020-07-27 MED ORDER — DEXAMETHASONE 4 MG PO TABS
6.0000 mg | ORAL_TABLET | Freq: Every day | ORAL | Status: DC
  Administered 2020-07-27 – 2020-07-29 (×3): 6 mg via ORAL
  Filled 2020-07-27 (×3): qty 2

## 2020-07-27 NOTE — Progress Notes (Signed)
PROGRESS NOTE    Jaime COMMERFORD  PFX:[id number] DOB: Dec 01, 1931 DOA: 07/21/2020 PCP: Crist Infante, MD    Brief Narrative:  86 year old female with history of osteoporosis, hyperlipidemia, vaccinated against COVID-19 presented to the ER with about 2 weeks of generalized malaise and weakness and muscle aches and poor oral intake and extreme fatigue.  She also had intermittent low-grade fever.  She was tested positive for COVID-19 on 8/23 after her daughter was tested positive a week ago.  Also has bilateral lower extremity swelling. In the emergency room, hemodynamically stable.  On 2 L oxygen.  CTA of the chest showed bilateral pulmonary emboli with saddle embolism and right heart strain.  Started on heparin infusion and admitted to the hospital.   Assessment & Plan:   Principal Problem:   COVID-19 virus infection Active Problems:   Mixed hyperlipidemia   Acute saddle pulmonary embolism with acute cor pulmonale (HCC)   Open-angle glaucoma   Acute respiratory failure with hypoxia (HCC)   Elevated troponin level not due myocardial infarction   Acute hypoxemic respiratory failure due to COVID-19 ([place])  Acute hypoxemic respiratory failure secondary to combination of COVID-19 pneumonia and acute saddle pulmonary embolism with acute cor pulmonale: Pulmonary embolism/left lower extremity multiple vein DVT. Patient is currently on room air to minimum oxygen requirement.  2D echocardiogram showed evidence of right heart strain.  Given patient's stability, as suggested by pulmonology patient is not catheter directed TPA candidate. Tolerating heparin.  Changed to Eliquis  Given saddle embolus, patient needs to be on lifelong anticoagulation.   COVID-19 pneumonia in a vaccinated patient: Does have evidence of bilateral pneumonia.  Hypoxic especially given saddle PE. Continue to monitor due to significant symptoms  chest physiotherapy, incentive spirometry, deep breathing exercises, sputum  induction, mucolytic's and bronchodilators. Supplemental oxygen to keep saturations more than 90%. Covid directed therapy with , steroids, on dexamethasone day 5/10.  Change to oral medicine. remdesivir, completed therapy. antibiotics not indicated  COVID-19 Labs  No results for input(s): DDIMER, FERRITIN, LDH, CRP in the last 72 hours.  Lab Results  Component Value Date   SARSCOV2NAA POSITIVE (A) 07/21/2020   SpO2: 95 % O2 Flow Rate (L/min): 2 L/min   Elevated troponins: Due to acute PE.  Hyperlipidemia: On a statin.  Continued.  Mobilize with PT OT.  Refer to skilled nursing facility for short-term rehab.   DVT prophylaxis:  Eliquis    Code Status: DNR Family Communication: Daughter 9/4. Disposition Plan: Status is: Inpatient  Remains inpatient appropriate because:Hemodynamically unstable and Inpatient level of care appropriate due to severity of illness   Dispo: The patient is from: Home              Anticipated d/c is to: Skilled nursing facility.              Anticipated d/c date is: When bed is available.              Patient currently is medically stable.         Consultants:   Pulmonary,   Procedures:   None  Antimicrobials:  Antibiotics Given (last 72 hours)    Date/Time Action Medication Dose Rate   07/24/20 1202 New Bag/Given   remdesivir 100 mg in sodium chloride 0.9 % 100 mL IVPB 100 mg 200 mL/hr   07/25/20 0929 New Bag/Given   remdesivir 100 mg in sodium chloride 0.9 % 100 mL IVPB 100 mg 200 mL/hr   07/26/20 1125 New Bag/Given   remdesivir  100 mg in sodium chloride 0.9 % 100 mL IVPB 100 mg 200 mL/hr         Subjective: Patient seen and examined.  No overnight events.  Mostly on room air.  Feels less dizzy.  Looking forward to go to rehab.  Wants to meet her daughter so she can work on her some household business.  Objective: Vitals:   07/26/20 0411 07/26/20 1500 07/26/20 2103 07/27/20 0507  BP: (!) 160/65 (!) 146/69 (!) 164/68  (!) 165/69  Pulse: (!) 56 63 (!) 59 (!) 59  Resp: (!) 23 19 (!) 23 (!) 21  Temp: 97.7 F (36.5 C) 97.8 F (36.6 C) 98.9 F (37.2 C) 97.7 F (36.5 C)  TempSrc: Oral Oral  Oral  SpO2: 99% 96% 97% 95%  Weight:      Height:        Intake/Output Summary (Last 24 hours) at 07/27/2020 1116 Last data filed at 07/27/2020 0900 Gross per 24 hour  Intake 383 ml  Output 650 ml  Net -267 ml   Filed Weights   07/21/20 1943  Weight: 90.7 kg    Examination:  General exam: Appears calm and comfortable  Respiratory system: Clear to auscultation. Respiratory effort normal.  No added sounds.  On 3 L of oxygen. Cardiovascular system: S1 & S2 heard, RRR. No JVD, murmurs, rubs, gallops or clicks.  Bilateral trace pedal edema.   Gastrointestinal system: Abdomen is nondistended, soft and nontender. No organomegaly or masses felt. Normal bowel sounds heard. Central nervous system: Alert and oriented. No focal neurological deficits. Extremities: Symmetric 5 x 5 power. Skin: No rashes, lesions or ulcers Psychiatry: Judgement and insight appear normal. Mood & affect appropriate.     Data Reviewed: I have personally reviewed following labs and imaging studies  CBC: Recent Labs  Lab 07/21/20 2013 07/21/20 2013 07/22/20 1224 07/22/20 0850 07/23/20 0454 07/24/20 0049 07/25/20 0452  WBC 13.0*   < > 12.7* 12.9* 16.5* 15.7* 10.3  NEUTROABS 10.2*  --  10.6*  --   --   --   --   HGB 13.0   < > 12.6 12.5 12.7 12.1 12.2  HCT 40.3   < > 39.3 39.3 39.8 38.6 38.2  MCV 90.2   < > 90.3 89.9 89.2 91.7 90.3  PLT 186   < > 175 183 187 216 221   < > = values in this interval not displayed.   Basic Metabolic Panel: Recent Labs  Lab 07/21/20 2013 07/22/20 8250 07/23/20 0454 07/24/20 0049 07/25/20 0452 07/26/20 0329 07/27/20 0418  NA   < >  --  138 139 140 140 145  K   < >  --  4.2 4.1 3.5 3.4* 5.0  CL   < >  --  104 105 108 107 110  CO2   < >  --  25 22 23 25 25   GLUCOSE   < >  --  128* 103* 132*  108* 117*  BUN   < >  --  28* 24* 21 22 21   CREATININE   < >  --  0.88 0.78 0.58 0.67 0.67  CALCIUM   < >  --  8.7* 8.3* 8.3* 8.1* 8.4*  MG  --  1.8  --   --   --   --   --   PHOS  --  3.1  --   --   --   --   --    < > = values  in this interval not displayed.   GFR: Estimated Creatinine Clearance: 56.2 mL/min (by C-G formula based on SCr of 0.67 mg/dL). Liver Function Tests: Recent Labs  Lab 07/23/20 0454 07/24/20 0049 07/25/20 0452 07/26/20 0329 07/27/20 0418  AST 26 23 21 19 25   ALT 13 13 13 14 18   ALKPHOS 58 51 54 51 53  BILITOT 0.8 0.7 0.5 0.6 0.6  PROT 7.8 7.0 6.7 6.4* 6.4*  ALBUMIN 3.0* 2.8* 2.7* 2.5* 2.6*   Recent Labs  Lab 07/22/20 0442  LIPASE 28   No results for input(s): AMMONIA in the last 168 hours. Coagulation Profile: Recent Labs  Lab 07/22/20 0442  INR 1.3*   Cardiac Enzymes: No results for input(s): CKTOTAL, CKMB, CKMBINDEX, TROPONINI in the last 168 hours. BNP (last 3 results) No results for input(s): PROBNP in the last 8760 hours. HbA1C: No results for input(s): HGBA1C in the last 72 hours. CBG: No results for input(s): GLUCAP in the last 168 hours. Lipid Profile: No results for input(s): CHOL, HDL, LDLCALC, TRIG, CHOLHDL, LDLDIRECT in the last 72 hours. Thyroid Function Tests: No results for input(s): TSH, T4TOTAL, FREET4, T3FREE, THYROIDAB in the last 72 hours. Anemia Panel: No results for input(s): VITAMINB12, FOLATE, FERRITIN, TIBC, IRON, RETICCTPCT in the last 72 hours. Sepsis Labs: Recent Labs  Lab 07/21/20 2013  PROCALCITON <0.10  LATICACIDVEN 1.8    Recent Results (from the past 240 hour(s))  SARS Coronavirus 2 by RT PCR (hospital order, performed in [facility] hospital lab) Nasopharyngeal Nasopharyngeal Swab     Status: Abnormal   Collection Time: 07/21/20  8:13 PM   Specimen: Nasopharyngeal Swab  Result Value Ref Range Status   SARS Coronavirus 2 POSITIVE (A) NEGATIVE Final    Comment: RESULT CALLED TO, READ BACK BY AND  VERIFIED WITH: RN Alanson Aly AT 2239 07/21/20 CRUICKSHANK A (NOTE) SARS-CoV-2 target nucleic acids are DETECTED  SARS-CoV-2 RNA is generally detectable in upper respiratory specimens  during the acute phase of infection.  Positive results are indicative  of the presence of the identified virus, but do not rule out bacterial infection or co-infection with other pathogens not detected by the test.  Clinical correlation with patient history and  other diagnostic information is necessary to determine patient infection status.  The expected result is negative.  Fact Sheet for Patients:   StrictlyIdeas.no   Fact Sheet for Healthcare Providers:   BankingDealers.co.za    This test is not yet approved or cleared by the [place] FDA and  has been authorized for detection and/or diagnosis of SARS-CoV-2 by FDA under an Emergency Use Authorization (EUA).  This EUA will remain in effect (mean ing this test can be used) for the duration of  the COVID-19 declaration under Section 564(b)(1) of the Act, 21 U.S.C. section 360-bbb-3(b)(1), unless the authorization is terminated or revoked sooner.  Performed at [facility], [place] [address] [place], [place] [address]   Blood Culture (routine x 2)     Status: None   Collection Time: 07/21/20  8:13 PM   Specimen: BLOOD  Result Value Ref Range Status   Specimen Description   Final    BLOOD LEFT ANTECUBITAL Performed at [place] [address], [place], [place] [address]    Special Requests   Final    BOTTLES DRAWN AEROBIC AND ANAEROBIC Blood Culture adequate volume Performed at [place] [address] [place], [place] [address]    Culture   Final  NO GROWTH 5 DAYS Performed at [facility], [place] [address], [place], [place] [address]    Report Status 07/26/2020 FINAL  Final  Blood Culture (routine x 2)     Status:  Abnormal   Collection Time: 07/21/20  8:13 PM   Specimen: BLOOD RIGHT HAND  Result Value Ref Range Status   Specimen Description   Final    BLOOD RIGHT HAND Performed at [facility], [place] [address] [place], [place] [address]    Special Requests   Final    BOTTLES DRAWN AEROBIC AND ANAEROBIC Blood Culture results may not be optimal due to an inadequate volume of blood received in culture bottles Performed at [place] [address] [place], [place] [address]    Culture  Setup Time   Final    GRAM POSITIVE COCCI IN CLUSTERS IN BOTH AEROBIC AND ANAEROBIC BOTTLES CRITICAL RESULT CALLED TO, READ BACK BY AND VERIFIED WITH: Sheffield Slider [facility] 07/23/20 0125 JDW    Culture (A)  Final    STAPHYLOCOCCUS EPIDERMIDIS THE SIGNIFICANCE OF ISOLATING THIS ORGANISM FROM A SINGLE SET OF BLOOD CULTURES WHEN MULTIPLE SETS ARE DRAWN IS UNCERTAIN. PLEASE NOTIFY THE MICROBIOLOGY DEPARTMENT WITHIN ONE WEEK IF SPECIATION AND SENSITIVITIES ARE REQUIRED. Performed at [facility], [place] [address] [place], [place] [address]    Report Status 07/24/2020 FINAL  Final  Blood Culture ID Panel (Reflexed)     Status: Abnormal   Collection Time: 07/21/20  8:13 PM  Result Value Ref Range Status   Enterococcus faecalis NOT DETECTED NOT DETECTED Final   Enterococcus Faecium NOT DETECTED NOT DETECTED Final   Listeria monocytogenes NOT DETECTED NOT DETECTED Final   Staphylococcus species DETECTED (A) NOT DETECTED Final    Comment: CRITICAL RESULT CALLED TO, READ BACK BY AND VERIFIED WITH: Sheffield Slider [facility] 07/23/20 0125    Staphylococcus aureus (BCID) NOT DETECTED NOT DETECTED Final   Staphylococcus epidermidis DETECTED (A) NOT DETECTED Final    Comment: Methicillin (oxacillin) resistant coagulase negative staphylococcus. Possible blood culture contaminant (unless isolated from more than one blood culture draw or clinical case suggests pathogenicity). No antibiotic treatment is indicated  for blood  culture contaminants. CRITICAL RESULT CALLED TO, READ BACK BY AND VERIFIED WITH: Sheffield Slider [facility] 07/23/20 0125 JDW    Staphylococcus lugdunensis NOT DETECTED NOT DETECTED Final   Streptococcus species NOT DETECTED NOT DETECTED Final   Streptococcus agalactiae NOT DETECTED NOT DETECTED Final   Streptococcus pneumoniae NOT DETECTED NOT DETECTED Final   Streptococcus pyogenes NOT DETECTED NOT DETECTED Final   A.calcoaceticus-baumannii NOT DETECTED NOT DETECTED Final   Bacteroides fragilis NOT DETECTED NOT DETECTED Final   Enterobacterales NOT DETECTED NOT DETECTED Final   Enterobacter cloacae complex NOT DETECTED NOT DETECTED Final   Escherichia coli NOT DETECTED NOT DETECTED Final   Klebsiella aerogenes NOT DETECTED NOT DETECTED Final   Klebsiella oxytoca NOT DETECTED NOT DETECTED Final   Klebsiella pneumoniae NOT DETECTED NOT DETECTED Final   Proteus species NOT DETECTED NOT DETECTED Final   Salmonella species NOT DETECTED NOT DETECTED Final   Serratia marcescens NOT DETECTED NOT DETECTED Final   Haemophilus influenzae NOT DETECTED NOT DETECTED Final   Neisseria meningitidis NOT DETECTED NOT DETECTED Final   Pseudomonas aeruginosa NOT DETECTED NOT DETECTED Final   Stenotrophomonas maltophilia NOT DETECTED NOT DETECTED Final   Candida albicans NOT DETECTED NOT DETECTED Final   Candida auris NOT DETECTED NOT DETECTED Final   Candida glabrata NOT DETECTED  NOT DETECTED Final   Candida krusei NOT DETECTED NOT DETECTED Final   Candida parapsilosis NOT DETECTED NOT DETECTED Final   Candida tropicalis NOT DETECTED NOT DETECTED Final   Cryptococcus neoformans/gattii NOT DETECTED NOT DETECTED Final   Methicillin resistance mecA/C DETECTED (A) NOT DETECTED Final    Comment: CRITICAL RESULT CALLED TO, READ BACK BY AND VERIFIED WITH: Sheffield Slider [facility] 07/23/20 0125 JDW Performed at [facility], [address] [place], [place] [address]          Radiology Studies: No  results found.      Scheduled Meds: . albuterol  2 puff Inhalation Q6H  . apixaban  10 mg Oral BID   Followed by  . [START ON 08/01/2020] apixaban  5 mg Oral BID  . vitamin C  500 mg Oral Daily  . dexamethasone  6 mg Oral Daily  . dorzolamide-timolol  1 drop Both Eyes BID  . latanoprost  1 drop Both Eyes QHS  . loratadine  10 mg Oral Daily  . rosuvastatin  2.5 mg Oral Daily  . zinc sulfate  220 mg Oral Daily   Continuous Infusions:    LOS: 5 days    Time spent: 30 minutes    Barb Merino, MD [facility] Pager [phone]

## 2020-07-27 NOTE — Progress Notes (Signed)
Kasia RN and [place] NT attempted to transfer pt to chair from bed for lunch.  Pt was able to rotate/scoot/sit at EOB without assistance.  Upon attempt to stand, pt was too weak to hold her own weight up with 2x assist underarm hold and support.  Kasia RN felt best sitting the patient upright in bed for lunch rather than attempt a transfer at this time.

## 2020-07-28 NOTE — Progress Notes (Signed)
PROGRESS NOTE    ELEANNA THEILEN  WUJ:[id number] DOB: 22-Apr-1932 DOA: 07/21/2020 PCP: Crist Infante, MD    Brief Narrative:  86 year old female with history of osteoporosis, hyperlipidemia, vaccinated against COVID-19 presented to the ER with about 2 weeks of generalized malaise and weakness and muscle aches and poor oral intake and extreme fatigue.  She also had intermittent low-grade fever.  She was tested positive for COVID-19 on 8/23 after her daughter was tested positive a week ago.  Also has bilateral lower extremity swelling. In the emergency room, hemodynamically stable.  On 2 L oxygen.  CTA of the chest showed bilateral pulmonary emboli with saddle embolism and right heart strain.  Started on heparin infusion and admitted to the hospital.   Assessment & Plan:   Principal Problem:   COVID-19 virus infection Active Problems:   Mixed hyperlipidemia   Acute saddle pulmonary embolism with acute cor pulmonale (HCC)   Open-angle glaucoma   Acute respiratory failure with hypoxia (HCC)   Elevated troponin level not due myocardial infarction   Acute hypoxemic respiratory failure due to COVID-19 ([place])  Acute hypoxemic respiratory failure secondary to combination of COVID-19 pneumonia and acute saddle pulmonary embolism with acute cor pulmonale: Pulmonary embolism/left lower extremity multiple vein DVT. Patient is currently on room air to minimum oxygen requirement.  2D echocardiogram showed evidence of right heart strain.  Given patient's stability, as suggested by pulmonology patient is not catheter directed TPA candidate. Tolerating heparin.  Changed to Eliquis  Given saddle embolus, patient needs to be on lifelong anticoagulation.   COVID-19 pneumonia in a vaccinated patient: Does have evidence of bilateral pneumonia.  Hypoxic especially given saddle PE. Continue to monitor due to significant symptoms  chest physiotherapy, incentive spirometry, deep breathing exercises, sputum  induction, mucolytic's and bronchodilators. Supplemental oxygen to keep saturations more than 90%. Covid directed therapy with , steroids, on dexamethasone day 6/10.  Change to oral medicine. remdesivir, completed therapy. antibiotics not indicated  COVID-19 Labs  No results for input(s): DDIMER, FERRITIN, LDH, CRP in the last 72 hours.  Lab Results  Component Value Date   SARSCOV2NAA POSITIVE (A) 07/21/2020   SpO2: 96 % O2 Flow Rate (L/min): 2 L/min   Elevated troponins: Due to acute PE.  Hyperlipidemia: On a statin.  Continued.  Mobilize with PT OT.  Refer to skilled nursing facility for short-term rehab.   DVT prophylaxis:  Eliquis    Code Status: DNR Family Communication: Daughter 9/4. She was unable to pick up the phone 9/5 Disposition Plan: Status is: Inpatient  Remains inpatient appropriate because:Hemodynamically unstable and Inpatient level of care appropriate due to severity of illness   Dispo: The patient is from: Home              Anticipated d/c is to: Skilled nursing facility.              Anticipated d/c date is: When bed is available.              Patient currently is medically stable.         Consultants:   Pulmonary,   Procedures:   None  Antimicrobials:  Antibiotics Given (last 72 hours)    Date/Time Action Medication Dose Rate   07/26/20 1125 New Bag/Given   remdesivir 100 mg in sodium chloride 0.9 % 100 mL IVPB 100 mg 200 mL/hr         Subjective: Patient was seen and examined.  No overnight events.  Extremely weak for mobility  otherwise no overnight events.  Mostly on room air.  Objective: Vitals:   07/27/20 0507 07/27/20 1355 07/27/20 2012 07/28/20 0559  BP: (!) 165/69 137/63 (!) 145/72 (!) 170/73  Pulse: (!) 59 (!) 57 (!) 58 (!) 52  Resp: (!) 21 17 20 20   Temp: 97.7 F (36.5 C) 97.8 F (36.6 C) 97.7 F (36.5 C) (!) 97.4 F (36.3 C)  TempSrc: Oral   Oral  SpO2: 95% 98% 96% 96%  Weight:      Height:         Intake/Output Summary (Last 24 hours) at 07/28/2020 1120 Last data filed at 07/28/2020 0928 Gross per 24 hour  Intake 623 ml  Output 600 ml  Net 23 ml   Filed Weights   07/21/20 1943  Weight: 90.7 kg    Examination:  General exam: Appears calm and comfortable  Respiratory system: Clear to auscultation. Respiratory effort normal.  No added sounds.  On room air. Cardiovascular system: S1 & S2 heard, RRR. No JVD, murmurs, rubs, gallops or clicks.  Bilateral trace pedal edema.   Gastrointestinal system: Abdomen is nondistended, soft and nontender. No organomegaly or masses felt. Normal bowel sounds heard. Central nervous system: Alert and oriented. No focal neurological deficits. Extremities: Symmetric 5 x 5 power. Skin: No rashes, lesions or ulcers Psychiatry: Judgement and insight appear normal. Mood & affect appropriate.     Data Reviewed: I have personally reviewed following labs and imaging studies  CBC: Recent Labs  Lab 07/21/20 2013 07/21/20 2013 07/22/20 7425 07/22/20 0850 07/23/20 0454 07/24/20 0049 07/25/20 0452  WBC 13.0*   < > 12.7* 12.9* 16.5* 15.7* 10.3  NEUTROABS 10.2*  --  10.6*  --   --   --   --   HGB 13.0   < > 12.6 12.5 12.7 12.1 12.2  HCT 40.3   < > 39.3 39.3 39.8 38.6 38.2  MCV 90.2   < > 90.3 89.9 89.2 91.7 90.3  PLT 186   < > 175 183 187 216 221   < > = values in this interval not displayed.   Basic Metabolic Panel: Recent Labs  Lab 07/21/20 2013 07/22/20 9563 07/23/20 0454 07/24/20 0049 07/25/20 0452 07/26/20 0329 07/27/20 0418  NA   < >  --  138 139 140 140 145  K   < >  --  4.2 4.1 3.5 3.4* 5.0  CL   < >  --  104 105 108 107 110  CO2   < >  --  25 22 23 25 25   GLUCOSE   < >  --  128* 103* 132* 108* 117*  BUN   < >  --  28* 24* 21 22 21   CREATININE   < >  --  0.88 0.78 0.58 0.67 0.67  CALCIUM   < >  --  8.7* 8.3* 8.3* 8.1* 8.4*  MG  --  1.8  --   --   --   --   --   PHOS  --  3.1  --   --   --   --   --    < > = values in this  interval not displayed.   GFR: Estimated Creatinine Clearance: 56.2 mL/min (by C-G formula based on SCr of 0.67 mg/dL). Liver Function Tests: Recent Labs  Lab 07/23/20 0454 07/24/20 0049 07/25/20 0452 07/26/20 0329 07/27/20 0418  AST 26 23 21 19 25   ALT 13 13 13 14 18   ALKPHOS 58  51 54 51 53  BILITOT 0.8 0.7 0.5 0.6 0.6  PROT 7.8 7.0 6.7 6.4* 6.4*  ALBUMIN 3.0* 2.8* 2.7* 2.5* 2.6*   Recent Labs  Lab 07/22/20 0442  LIPASE 28   No results for input(s): AMMONIA in the last 168 hours. Coagulation Profile: Recent Labs  Lab 07/22/20 0442  INR 1.3*   Cardiac Enzymes: No results for input(s): CKTOTAL, CKMB, CKMBINDEX, TROPONINI in the last 168 hours. BNP (last 3 results) No results for input(s): PROBNP in the last 8760 hours. HbA1C: No results for input(s): HGBA1C in the last 72 hours. CBG: No results for input(s): GLUCAP in the last 168 hours. Lipid Profile: No results for input(s): CHOL, HDL, LDLCALC, TRIG, CHOLHDL, LDLDIRECT in the last 72 hours. Thyroid Function Tests: No results for input(s): TSH, T4TOTAL, FREET4, T3FREE, THYROIDAB in the last 72 hours. Anemia Panel: No results for input(s): VITAMINB12, FOLATE, FERRITIN, TIBC, IRON, RETICCTPCT in the last 72 hours. Sepsis Labs: Recent Labs  Lab 07/21/20 2013  PROCALCITON <0.10  LATICACIDVEN 1.8    Recent Results (from the past 240 hour(s))  SARS Coronavirus 2 by RT PCR (hospital order, performed in [facility] hospital lab) Nasopharyngeal Nasopharyngeal Swab     Status: Abnormal   Collection Time: 07/21/20  8:13 PM   Specimen: Nasopharyngeal Swab  Result Value Ref Range Status   SARS Coronavirus 2 POSITIVE (A) NEGATIVE Final    Comment: RESULT CALLED TO, READ BACK BY AND VERIFIED WITH: RN Alanson Aly AT 2239 07/21/20 CRUICKSHANK A (NOTE) SARS-CoV-2 target nucleic acids are DETECTED  SARS-CoV-2 RNA is generally detectable in upper respiratory specimens  during the acute phase of infection.  Positive  results are indicative  of the presence of the identified virus, but do not rule out bacterial infection or co-infection with other pathogens not detected by the test.  Clinical correlation with patient history and  other diagnostic information is necessary to determine patient infection status.  The expected result is negative.  Fact Sheet for Patients:   StrictlyIdeas.no   Fact Sheet for Healthcare Providers:   BankingDealers.co.za    This test is not yet approved or cleared by the [place] FDA and  has been authorized for detection and/or diagnosis of SARS-CoV-2 by FDA under an Emergency Use Authorization (EUA).  This EUA will remain in effect (mean ing this test can be used) for the duration of  the COVID-19 declaration under Section 564(b)(1) of the Act, 21 U.S.C. section 360-bbb-3(b)(1), unless the authorization is terminated or revoked sooner.  Performed at [facility], [place] [address] [place], [place] [address]   Blood Culture (routine x 2)     Status: None   Collection Time: 07/21/20  8:13 PM   Specimen: BLOOD  Result Value Ref Range Status   Specimen Description   Final    BLOOD LEFT ANTECUBITAL Performed at [place] [address], [place], [place] [address]    Special Requests   Final    BOTTLES DRAWN AEROBIC AND ANAEROBIC Blood Culture adequate volume Performed at [place] [address] [place], [place] [address]    Culture   Final    NO GROWTH 5 DAYS Performed at [facility], [place] [address] [place], [place] [address]    Report Status 07/26/2020 FINAL  Final  Blood Culture (routine x 2)     Status: Abnormal   Collection Time: 07/21/20  8:13 PM   Specimen: BLOOD RIGHT HAND  Result  Value Ref Range Status   Specimen Description   Final    BLOOD RIGHT HAND Performed at [facility], [place] [address], [place], [place] [address]      Special Requests   Final    BOTTLES DRAWN AEROBIC AND ANAEROBIC Blood Culture results may not be optimal due to an inadequate volume of blood received in culture bottles Performed at [place] [address], [place], [place] [address]    Culture  Setup Time   Final    GRAM POSITIVE COCCI IN CLUSTERS IN BOTH AEROBIC AND ANAEROBIC BOTTLES CRITICAL RESULT CALLED TO, READ BACK BY AND VERIFIED WITH: Sheffield Slider [facility] 07/23/20 0125 JDW    Culture (A)  Final    STAPHYLOCOCCUS EPIDERMIDIS THE SIGNIFICANCE OF ISOLATING THIS ORGANISM FROM A SINGLE SET OF BLOOD CULTURES WHEN MULTIPLE SETS ARE DRAWN IS UNCERTAIN. PLEASE NOTIFY THE MICROBIOLOGY DEPARTMENT WITHIN ONE WEEK IF SPECIATION AND SENSITIVITIES ARE REQUIRED. Performed at [facility], [place] [address] [place], [place] [address]    Report Status 07/24/2020 FINAL  Final  Blood Culture ID Panel (Reflexed)     Status: Abnormal   Collection Time: 07/21/20  8:13 PM  Result Value Ref Range Status   Enterococcus faecalis NOT DETECTED NOT DETECTED Final   Enterococcus Faecium NOT DETECTED NOT DETECTED Final   Listeria monocytogenes NOT DETECTED NOT DETECTED Final   Staphylococcus species DETECTED (A) NOT DETECTED Final    Comment: CRITICAL RESULT CALLED TO, READ BACK BY AND VERIFIED WITH: Sheffield Slider PHAR[facility] 07/23/20 0125    Staphylococcus aureus (BCID) NOT DETECTED NOT DETECTED Final   Staphylococcus epidermidis DETECTED (A) NOT DETECTED Final    Comment: Methicillin (oxacillin) resistant coagulase negative staphylococcus. Possible blood culture contaminant (unless isolated from more than one blood culture draw or clinical case suggests pathogenicity). No antibiotic treatment is indicated for blood  culture contaminants. CRITICAL RESULT CALLED TO, READ BACK BY AND VERIFIED WITH: Sheffield Slider [facility] 07/23/20 0125 JDW    Staphylococcus lugdunensis NOT DETECTED NOT DETECTED Final   Streptococcus species NOT DETECTED NOT  DETECTED Final   Streptococcus agalactiae NOT DETECTED NOT DETECTED Final   Streptococcus pneumoniae NOT DETECTED NOT DETECTED Final   Streptococcus pyogenes NOT DETECTED NOT DETECTED Final   A.calcoaceticus-baumannii NOT DETECTED NOT DETECTED Final   Bacteroides fragilis NOT DETECTED NOT DETECTED Final   Enterobacterales NOT DETECTED NOT DETECTED Final   Enterobacter cloacae complex NOT DETECTED NOT DETECTED Final   Escherichia coli NOT DETECTED NOT DETECTED Final   Klebsiella aerogenes NOT DETECTED NOT DETECTED Final   Klebsiella oxytoca NOT DETECTED NOT DETECTED Final   Klebsiella pneumoniae NOT DETECTED NOT DETECTED Final   Proteus species NOT DETECTED NOT DETECTED Final   Salmonella species NOT DETECTED NOT DETECTED Final   Serratia marcescens NOT DETECTED NOT DETECTED Final   Haemophilus influenzae NOT DETECTED NOT DETECTED Final   Neisseria meningitidis NOT DETECTED NOT DETECTED Final   Pseudomonas aeruginosa NOT DETECTED NOT DETECTED Final   Stenotrophomonas maltophilia NOT DETECTED NOT DETECTED Final   Candida albicans NOT DETECTED NOT DETECTED Final   Candida auris NOT DETECTED NOT DETECTED Final   Candida glabrata NOT DETECTED NOT DETECTED Final   Candida krusei NOT DETECTED NOT DETECTED Final   Candida parapsilosis NOT DETECTED NOT DETECTED Final   Candida tropicalis NOT DETECTED NOT DETECTED Final   Cryptococcus neoformans/gattii NOT DETECTED NOT DETECTED Final   Methicillin resistance mecA/C DETECTED (A) NOT DETECTED Final    Comment:  CRITICAL RESULT CALLED TO, READ BACK BY AND VERIFIED WITH: Sheffield Slider [facility] 07/23/20 0125 JDW Performed at [facility], [address], [place], [place] [address]          Radiology Studies: No results found.      Scheduled Meds:  albuterol  2 puff Inhalation Q6H   apixaban  10 mg Oral BID   Followed by   Derrill Memo ON 08/01/2020] apixaban  5 mg Oral BID   vitamin C  500 mg Oral Daily   dexamethasone  6 mg Oral  Daily   dorzolamide-timolol  1 drop Both Eyes BID   latanoprost  1 drop Both Eyes QHS   loratadine  10 mg Oral Daily   rosuvastatin  2.5 mg Oral Daily   zinc sulfate  220 mg Oral Daily   Continuous Infusions:    LOS: 6 days    Time spent: 30 minutes    Barb Merino, MD [facility] Pager [phone]

## 2020-07-28 NOTE — Progress Notes (Addendum)
Physical Therapy Treatment Patient Details Name: Jaime [name] MRN: [id number] DOB: November 17, 1932 Today's Date: 07/28/2020    History of Present Illness Patient is a 86 year old female past medical history of osteoporosis, hyperlipidemia, open-angle glaucoma that was diagnosed with COVID approximately 1 week prior to admission and was quarentining at home. Patient developed increased shortness of breath and was admitted 8/30. CT angiogram of the chest revealed bilateral pulmonary emboli with evidence of saddle embolism and right heart strain.     PT Comments    Pt  Progressing with encouragement. Reluctant to get up to chair, expresses that she is fearful of falling; min assist overall today, SpO2=95-100% on RA. Continue to recommend SNF post acute   Follow Up Recommendations  SNF;Supervision/Assistance - 24 hour     Equipment Recommendations  None recommended by PT    Recommendations for Other Services       Precautions / Restrictions Precautions Precautions: Fall Restrictions Weight Bearing Restrictions: No    Mobility  Bed Mobility Overal bed mobility: Needs Assistance       Supine to sit: Supervision;Min guard     General bed mobility comments: cues to self assist and complete task, incr time  Transfers Overall transfer level: Needs assistance Equipment used: Rolling walker (2 wheeled) Transfers: Sit to/from Stand Sit to Stand: Min assist Stand pivot transfers: Min assist       General transfer comment: verbal cues for foot position/incr knee flexion,  anterior-superior wt shift, assist to rise and stabilize; sit<>stand repeated x2, min assist to balance and manever RW for SPT    Ambulation/Gait                 Stairs             Wheelchair Mobility    Modified Rankin (Stroke Patients Only)       Balance   Sitting-balance support: Feet supported Sitting balance-Leahy Scale: Fair     Standing balance support: Bilateral upper extremity  supported;During functional activity Standing balance-Leahy Scale: Poor Standing balance comment: reliant on UEs adn external assist.                            Cognition Arousal/Alertness: Awake/alert Behavior During Therapy: WFL for tasks assessed/performed Overall Cognitive Status: Within Functional Limits for tasks assessed                                        Exercises      General Comments        Pertinent Vitals/Pain Pain Assessment: Faces Faces Pain Scale: Hurts a little bit Pain Location: LEs Pain Descriptors / Indicators: Sore;Grimacing Pain Intervention(s): Limited activity within patient's tolerance;Monitored during session    Home Living                      Prior Function            PT Goals (current goals can now be found in the care plan section) Acute Rehab PT Goals Patient Stated Goal: agreeable with up to chair PT Goal Formulation: With patient Time For Goal Achievement: 08/07/20 Potential to Achieve Goals: Good Progress towards PT goals: Progressing toward goals    Frequency    Min 2X/week      PT Plan Current plan remains appropriate    Co-evaluation  AM-PAC PT "6 Clicks" Mobility   Outcome Measure  Help needed turning from your back to your side while in a flat bed without using bedrails?: A Little Help needed moving from lying on your back to sitting on the side of a flat bed without using bedrails?: A Little Help needed moving to and from a bed to a chair (including a wheelchair)?: A Little Help needed standing up from a chair using your arms (e.g., wheelchair or bedside chair)?: A Little Help needed to walk in hospital room?: A Lot Help needed climbing 3-5 steps with a railing? : Total 6 Click Score: 15    End of Session Equipment Utilized During Treatment: Gait belt Activity Tolerance: Patient tolerated treatment well Patient left: in chair;with call bell/phone within  reach Nurse Communication: Mobility status PT Visit Diagnosis: Other abnormalities of gait and mobility (R26.89)     Time: 3225-6720 PT Time Calculation (min) (ACUTE ONLY): 24 min  Charges:  $Gait Training: 8-22 mins $Therapeutic Activity: 8-22 mins                     Baxter Flattery, PT  Acute Rehab Dept ([place]) [phone] Pager [phone]  07/28/2020    [facility] 07/28/2020, 1:49 PM

## 2020-07-29 DIAGNOSIS — R1319 Other dysphagia: Secondary | ICD-10-CM | POA: Diagnosis not present

## 2020-07-29 DIAGNOSIS — I2602 Saddle embolus of pulmonary artery with acute cor pulmonale: Secondary | ICD-10-CM | POA: Diagnosis not present

## 2020-07-29 DIAGNOSIS — M1711 Unilateral primary osteoarthritis, right knee: Secondary | ICD-10-CM | POA: Diagnosis not present

## 2020-07-29 DIAGNOSIS — Z743 Need for continuous supervision: Secondary | ICD-10-CM | POA: Diagnosis not present

## 2020-07-29 DIAGNOSIS — R5381 Other malaise: Secondary | ICD-10-CM | POA: Diagnosis not present

## 2020-07-29 DIAGNOSIS — J9601 Acute respiratory failure with hypoxia: Secondary | ICD-10-CM | POA: Diagnosis not present

## 2020-07-29 DIAGNOSIS — U071 COVID-19: Secondary | ICD-10-CM | POA: Diagnosis not present

## 2020-07-29 DIAGNOSIS — R2681 Unsteadiness on feet: Secondary | ICD-10-CM | POA: Diagnosis not present

## 2020-07-29 DIAGNOSIS — M6281 Muscle weakness (generalized): Secondary | ICD-10-CM | POA: Diagnosis not present

## 2020-07-29 DIAGNOSIS — M810 Age-related osteoporosis without current pathological fracture: Secondary | ICD-10-CM | POA: Diagnosis not present

## 2020-07-29 DIAGNOSIS — M6259 Muscle wasting and atrophy, not elsewhere classified, multiple sites: Secondary | ICD-10-CM | POA: Diagnosis not present

## 2020-07-29 DIAGNOSIS — R279 Unspecified lack of coordination: Secondary | ICD-10-CM | POA: Diagnosis not present

## 2020-07-29 DIAGNOSIS — R278 Other lack of coordination: Secondary | ICD-10-CM | POA: Diagnosis not present

## 2020-07-29 MED ORDER — ZINC SULFATE 220 (50 ZN) MG PO CAPS: 220.0000 mg | ORAL_CAPSULE | Freq: Every day | ORAL | 0 refills | Status: AC

## 2020-07-29 MED ORDER — APIXABAN 5 MG PO TABS: 5.0000 mg | ORAL_TABLET | Freq: Two times a day (BID) | ORAL | 0 refills | Status: DC

## 2020-07-29 MED ORDER — ALBUTEROL SULFATE HFA 108 (90 BASE) MCG/ACT IN AERS
2.0000 | INHALATION_SPRAY | Freq: Two times a day (BID) | RESPIRATORY_TRACT | Status: DC
  Administered 2020-07-29: 2 via RESPIRATORY_TRACT

## 2020-07-29 MED ORDER — ASCORBIC ACID 500 MG PO TABS: 500.0000 mg | ORAL_TABLET | Freq: Every day | ORAL | 0 refills | Status: AC

## 2020-07-29 MED ORDER — ALBUTEROL SULFATE HFA 108 (90 BASE) MCG/ACT IN AERS: 2.0000 | INHALATION_SPRAY | RESPIRATORY_TRACT | Status: AC | PRN

## 2020-07-29 NOTE — NC FL2 (Signed)
[place] LEVEL OF CARE SCREENING TOOL     IDENTIFICATION  Patient Name: Jaime [name] Birthdate: 10/04/1932 Sex: female Admission Date (Current Location): 07/21/2020  [facility] and [place] Number:  Herbalist and Address:  [facility],  [place], [place]      Provider Number: [id number]  Attending Physician Name and Address:  Barb Merino, MD  Relative Name and Phone Number:  [name] [phone]    Current Level of Care: Hospital Recommended Level of Care: [place] Prior Approval Number:    Date Approved/Denied:   PASRR Number: [id number]  Discharge Plan: SNF    Current Diagnoses: Patient Active Problem List   Diagnosis Date Noted   Acute saddle pulmonary embolism with acute cor pulmonale ([name]) 07/22/2020   Open-angle glaucoma 07/22/2020   Acute respiratory failure with hypoxia ([facility] [place]) 07/22/2020   COVID-19 virus infection 07/22/2020   Elevated troponin level not due myocardial infarction 07/22/2020   Acute hypoxemic respiratory failure due to COVID-19 [facility]) 07/22/2020   Acute blood loss anemia 02/28/2014   Osteoarthritis of right knee 02/27/2014   Total knee replacement status 02/27/2014   Mixed hyperlipidemia 02/13/2009   ALLERGIC RHINITIS 02/13/2009   PLEURAL EFFUSION, RIGHT 02/13/2009   PULMONARY NODULE 02/13/2009    Orientation RESPIRATION BLADDER Height & Weight     Self, Time, Situation, Place  O2 (3L O2) External catheter Weight: 90.7 kg Height:  5\' 5"  (165.1 cm)  BEHAVIORAL SYMPTOMS/MOOD NEUROLOGICAL BOWEL NUTRITION STATUS      Continent Diet (Regular diet)  AMBULATORY STATUS COMMUNICATION OF NEEDS Skin   Extensive Assist   Bruising (arms)                       Personal Care Assistance Level of Assistance  Bathing, Feeding, Dressing Bathing Assistance: Limited assistance Feeding assistance: Independent Dressing Assistance: Limited assistance      Functional Limitations Info  Sight, Hearing, Speech Sight Info: Adequate Hearing Info: Adequate Speech Info: Adequate    SPECIAL CARE FACTORS FREQUENCY  PT (By licensed PT), OT (By licensed OT)     PT Frequency: 5x week OT Frequency: 5x week            Contractures Contractures Info: Not present    Additional Factors Info  Code Status, Allergies Code Status Info: DNR Allergies Info: Codeine, Meperidine Hcl, Adhesive Tape, Sulfonamide Derivatives           Current Medications (07/29/2020):  This is the current hospital active medication list Current Facility-Administered Medications  Medication Dose Route Frequency Provider Last Rate Last Admin   acetaminophen (TYLENOL) tablet 650 mg  650 mg Oral Q6H PRN Vernelle Emerald, MD   650 mg at 07/22/20 0850   albuterol (VENTOLIN HFA) 108 (90 Base) MCG/ACT inhaler 2 puff  2 puff Inhalation Q4H PRN Vernelle Emerald, MD   2 puff at 07/25/20 2223   albuterol (VENTOLIN HFA) 108 (90 Base) MCG/ACT inhaler 2 puff  2 puff Inhalation BID Barb Merino, MD       apixaban (ELIQUIS) tablet 10 mg  10 mg Oral BID Lynelle Doctor, RPH   10 mg at 07/28/20 2258   Followed by   Derrill Memo ON 08/01/2020] apixaban (ELIQUIS) tablet 5 mg  5 mg Oral BID Pham, Anh P, RPH       ascorbic acid (VITAMIN C) tablet 500 mg  500 mg Oral Daily Shalhoub, Sherryll Burger, MD   500 mg at  07/28/20 1058   dexamethasone (DECADRON) tablet 6 mg  6 mg Oral Daily Barb Merino, MD   6 mg at 07/28/20 1058   dorzolamide-timolol (COSOPT) 22.3-6.8 MG/ML ophthalmic solution 1 drop  1 drop Both Eyes BID Vernelle Emerald, MD   1 drop at 07/28/20 2259   fluticasone (FLONASE) 50 MCG/ACT nasal spray 1 spray  1 spray Each Nare Daily PRN Shalhoub, Sherryll Burger, MD       guaiFENesin-dextromethorphan (ROBITUSSIN DM) 100-10 MG/5ML syrup 10 mL  10 mL Oral Q4H PRN Shalhoub, Sherryll Burger, MD       latanoprost (XALATAN) 0.005 % ophthalmic solution 1 drop  1 drop Both Eyes QHS Shalhoub, Sherryll Burger,  MD   1 drop at 07/28/20 2259   loratadine (CLARITIN) tablet 10 mg  10 mg Oral Daily Shalhoub, Sherryll Burger, MD   10 mg at 07/28/20 1056   ondansetron (ZOFRAN) tablet 4 mg  4 mg Oral Q6H PRN Shalhoub, Sherryll Burger, MD       Or   ondansetron [facility]) injection 4 mg  4 mg Intravenous Q6H PRN Shalhoub, Sherryll Burger, MD   4 mg at 07/26/20 1422   polyethylene glycol (MIRALAX / GLYCOLAX) packet 17 g  17 g Oral Daily PRN Vernelle Emerald, MD   17 g at 07/28/20 1105   rosuvastatin (CRESTOR) tablet 2.5 mg  2.5 mg Oral Daily Shalhoub, Sherryll Burger, MD   2.5 mg at 07/28/20 1057   zinc sulfate capsule 220 mg  220 mg Oral Daily Shalhoub, Sherryll Burger, MD   220 mg at 07/28/20 1057     Discharge Medications: Please see discharge summary for a list of discharge medications.  Relevant Imaging Results:  Relevant Lab Results:   Additional Information [id number]  [place], [place]

## 2020-07-29 NOTE — Progress Notes (Signed)
Patient will be discharging to [facility] today. Patient's belongings were returned. Education and information was provided to the patient.

## 2020-07-29 NOTE — Discharge Summary (Signed)
Physician Discharge Summary  Jaime [name] NTI:[id number] DOB: [birth year] DOA: 07/21/2020  PCP: Crist Infante, MD  Admit date: 07/21/2020 Discharge date: 07/29/2020  Admitted From: Home Disposition: Skilled nursing facility  Recommendations for Outpatient Follow-up:  1. Follow up with PCP in 1-2 weeks 2. Please obtain BMP/CBC in one week   Discharge Condition: Fair CODE STATUS: DNR Diet recommendation: Regular diet  Discharge summary: 86 year old female with history of osteoporosis, hyperlipidemia, vaccinated against COVID-19 presented to the ER with about 2 weeks of generalized malaise and weakness and muscle aches and poor oral intake and extreme fatigue.  Patient passed out following trying to get to the bathroom on the day of admission.  She also had intermittent low-grade fever.  She was tested positive for COVID-19 on 8/23 after her daughter was tested positive a week ago.  Also has bilateral lower extremity swelling. In the emergency room, hemodynamically stable.  On 2 L oxygen.  CTA of the chest showed bilateral pulmonary emboli with saddle embolism and right heart strain.  Started on heparin infusion and admitted to the hospital.   Assessment & Plan of care :   Principal Problem:   COVID-19 virus infection Active Problems:   Mixed hyperlipidemia   Acute saddle pulmonary embolism with acute cor pulmonale (HCC)   Open-angle glaucoma   Acute respiratory failure with hypoxia (HCC)   Elevated troponin level not due myocardial infarction   Acute hypoxemic respiratory failure due to COVID-19 ([place])  Acute hypoxemic respiratory failure secondary to combination of COVID-19 pneumonia and acute saddle pulmonary embolism with acute cor pulmonale: Pulmonary embolism/left lower extremity multiple vein DVT. Patient is currently on room air. 2D echocardiogram showed evidence of right heart strain.  Given patient's stability, as suggested by pulmonology patient is not catheter directed TPA  candidate. Initially treated with heparin.  Improved appropriately and is stabilized. Day 8 on therapeutic anticoagulation with higher dose of Eliquis. She will resume 5 mg twice a day Eliquis starting tomorrow. Though her DVT and PE might have been aggravated by COVID-19 infection, however given saddle embolism she needs lifelong anticoagulation.   COVID-19 pneumonia in a vaccinated patient: Does have evidence of bilateral pneumonia.  Hypoxic especially given saddle PE. Most of her symptoms were related to saddle embolus.  She had minimal COVID-19 symptoms.  She was treated with standard therapy including breathing exercises and supportive treatment.  Currently on room air. Treated with steroids for 7 days, will discontinue on discharge. Remdesivir for 5 days, completed therapy. Isolation precautions for 21 days from initial diagnosis at 8/23.  Hyperlipidemia: On a statin.  Continued.  Patient is medically stabilized to transfer to a skilled level of care to continue inpatient therapies.   Discharge Diagnoses:  Principal Problem:   COVID-19 virus infection Active Problems:   Mixed hyperlipidemia   Acute saddle pulmonary embolism with acute cor pulmonale (HCC)   Open-angle glaucoma   Acute respiratory failure with hypoxia (HCC)   Elevated troponin level not due myocardial infarction   Acute hypoxemic respiratory failure due to COVID-19 [facility])    Discharge Instructions  Discharge Instructions    Call MD for:  difficulty breathing, headache or visual disturbances   Complete by: As directed    Call MD for:  persistant dizziness or light-headedness   Complete by: As directed    Diet general   Complete by: As directed    Increase activity slowly   Complete by: As directed      Allergies as of 07/29/2020  Reactions   Codeine    "makes me feel weird"   Meperidine Hcl    Lowers blood pressure    Adhesive [tape] Rash   Sulfonamide Derivatives Rash      Medication  List    TAKE these medications   albuterol 108 (90 Base) MCG/ACT inhaler Commonly known as: VENTOLIN HFA Inhale 2 puffs into the lungs every 4 (four) hours as needed for wheezing or shortness of breath.   alendronate 70 MG tablet Commonly known as: FOSAMAX Take 70 mg by mouth once a week. Take with a full glass of water on an empty stomach.   apixaban 5 MG Tabs tablet Commonly known as: ELIQUIS Take 1 tablet (5 mg total) by mouth 2 (two) times daily. Start taking on: July 30, 2020   ascorbic acid 500 MG tablet Commonly known as: VITAMIN C Take 1 tablet (500 mg total) by mouth daily. Start taking on: July 30, 2020   bimatoprost 0.01 % Soln Commonly known as: LUMIGAN Place 1 drop into both eyes at bedtime.   CALCIUM 600 + D PO Take 1 tablet by mouth daily.   CENTRUM SILVER PO Take 1 tablet by mouth daily.   dorzolamide-timolol 22.3-6.8 MG/ML ophthalmic solution Commonly known as: COSOPT Place 1 drop into both eyes 2 (two) times daily.   fexofenadine 180 MG tablet Commonly known as: ALLEGRA Take 180 mg by mouth daily.   fluticasone 50 MCG/ACT nasal spray Commonly known as: FLONASE Place 1 spray into both nostrils daily as needed for allergies or rhinitis.   rosuvastatin 10 MG tablet Commonly known as: CRESTOR Take 5 mg by mouth daily.   Vitamin D (Cholecalciferol) 10 MCG (400 UNIT) Tabs Take 1 tablet by mouth daily.   zinc sulfate 220 (50 Zn) MG capsule Take 1 capsule (220 mg total) by mouth daily. Start taking on: July 30, 2020       Contact information for after-discharge care    Destination    [facility] .   Service: Skilled Nursing Contact information: [address] [place] [phone]                 Allergies  Allergen Reactions  . Codeine     "makes me feel weird"  . Meperidine Hcl     Lowers blood pressure   . Adhesive [Tape] Rash  . Sulfonamide Derivatives Rash     Consultations:  Pulmonary   Procedures/Studies: CT Angio Chest PE W and/or Wo Contrast  Result Date: 07/21/2020 CLINICAL DATA:  COVID-19 positive, syncopal episode, hypoxic on room air EXAM: CT ANGIOGRAPHY CHEST WITH CONTRAST TECHNIQUE: Multidetector CT imaging of the chest was performed using the standard protocol during bolus administration of intravenous contrast. Multiplanar CT image reconstructions and MIPs were obtained to evaluate the vascular anatomy. CONTRAST:  54mL OMNIPAQUE IOHEXOL 350 MG/ML SOLN COMPARISON:  Chest radiograph 04/23/2016 FINDINGS: Cardiovascular: Satisfactory opacification of the pulmonary arteries. Large saddle embolus is seen in the distal pulmonary trunk. Filling defect seen extending into the left and right main pulmonary arteries as well as throughout the lobar and segmental pulmonary arterial branches of the left upper lobe, lingula and lower lobe. On the right, filling defect is seen predominantly within the right upper lobar, right interlobar pulmonary arteries and segmental pulmonary arteries of the right upper middle and lower lobes. Central pulmonary arteries are borderline enlarged. Marked elevation of the RV/LV ratio to 4.1. Flattening of the interventricular septum is noted. Reflux of contrast  is also noted into the IVC and hepatic veins. Cardiac size remains within normal limits. Few coronary artery calcifications are present. Atherosclerotic plaque within the normal caliber aorta. No acute aortic luminal abnormality. No periaortic stranding or hemorrhage. Normal 3 vessel branching of the aortic arch with minimal plaque in the proximal great vessels. Proximal great vessels are otherwise unremarkable. Mediastinum/Nodes: Extensive partially calcified mediastinal and hilar adenopathy seen bilaterally. No concerning mediastinal, hilar or axillary nodes. No acute abnormality of the trachea. Sliding-type hiatal hernia. Thoracic esophagus otherwise unremarkable. The  thyroid gland and thoracic inlet are unremarkable. Lungs/Pleura: Extensive respiratory motion artifact limits evaluation of the lung parenchyma. There is a small right pleural effusion and some passive atelectatic changes in the right lung base. Diffuse mild airways thickening and scattered secretions are noted including some thickened and fluid-filled airways in the medial right lung base. Question some small tree-in-bud opacities present in the periphery of the right upper lobe, medial right lower lobe into a lesser extent the lingula as well. No pneumothorax. Upper Abdomen: No acute abnormalities present in the visualized portions of the upper abdomen. Musculoskeletal: No acute osseous abnormality or suspicious osseous lesion. Multilevel degenerative changes are present in the imaged portions of the spine. Review of the MIP images confirms the above findings. IMPRESSION: 1. Extensive bilateral pulmonary emboli with a saddle embolus in the distal pulmonary trunk, extensive bilateral lobar and segmental pulmonary arterial branches throughout the lungs. Positive for acute PE with CT evidence of right heart strain (RV/LV Ratio = 4.1) consistent with at least submassive (intermediate risk) PE. The presence of right heart strain has been associated with an increased risk of morbidity and mortality. Small right pleural effusion and some passive atelectatic changes in the right lung base. 2. Diffuse mild airways thickening and scattered secretions including some thickened and fluid-filled airways in the medial right lung base. Question some small tree-in-bud opacities in the periphery of the right upper lobe, medial right lower lobe into a lesser extent the lingula as well. Findings could reflect an acute infectious or inflammatory process, particularly in the setting of COVID-19 positivity. 3. Extensive partially calcified mediastinal and hilar adenopathy, may be seen as sequela of remote granulomatous disease. 4.  Sliding-type hiatal hernia. 5. Aortic Atherosclerosis (ICD10-I70.0). Critical Value/emergent results were called by telephone at the time of interpretation on 07/21/2020 at 11:05 pm to provider [facility] , who verbally acknowledged these results. Electronically Signed   By: Lovena Le M.D.   On: 07/21/2020 23:06   ECHOCARDIOGRAM COMPLETE  Result Date: 07/22/2020    ECHOCARDIOGRAM REPORT   Patient Name:   Jaime [name] Date of Exam: 07/22/2020 Medical Rec #:  [id number]     Height:       65.0 in Accession #:    [id number]    Weight:       200.0 lb Date of Birth:  1932-06-11     BSA:          1.978 m Patient Age:    40 years      BP:           141/71 mmHg Patient Gender: F             HR:           75 bpm. Exam Location:  Inpatient Procedure: 2D Echo, Cardiac Doppler, Color Doppler and Intracardiac            Opacification Agent Indications:    I26.02 Pulmonary embolus  History:  Patient has no prior history of Echocardiogram examinations.                 COVID-19 +.  Sonographer:    Jonelle Sidle Dance Referring Phys: [id number] [place]  1. Left ventricular ejection fraction, by estimation, is 55 to 60%. The left ventricle has normal function. The left ventricle has no regional wall motion abnormalities. There is mild left ventricular hypertrophy. Left ventricular diastolic parameters are consistent with Grade I diastolic dysfunction (impaired relaxation).  2. Right ventricular systolic function is moderately reduced. Preservation of RV apical function with severe hypokinesis of the RV free wall is consistent with McConnell's sign seen with pulmonary embolism. The right ventricular size is moderately enlarged. There is moderately elevated pulmonary artery systolic pressure. The estimated right ventricular systolic pressure is 53.6 mmHg. Mildly D-shaped interventricular septum suggests RV pressure/volume overload.  3. Left atrial size was mildly dilated.  4. Right atrial size was mildly  dilated.  5. The mitral valve is normal in structure. No evidence of mitral valve regurgitation. No evidence of mitral stenosis.  6. The aortic valve is tricuspid. Aortic valve regurgitation is mild. Mild aortic valve sclerosis is present, with no evidence of aortic valve stenosis.  7. The inferior vena cava is dilated in size with <50% respiratory variability, suggesting right atrial pressure of 15 mmHg. FINDINGS  Left Ventricle: Left ventricular ejection fraction, by estimation, is 55 to 60%. The left ventricle has normal function. The left ventricle has no regional wall motion abnormalities. Definity contrast agent was given IV to delineate the left ventricular  endocardial borders. The left ventricular internal cavity size was normal in size. There is mild left ventricular hypertrophy. Left ventricular diastolic parameters are consistent with Grade I diastolic dysfunction (impaired relaxation). Right Ventricle: The right ventricular size is moderately enlarged. No increase in right ventricular wall thickness. Right ventricular systolic function is moderately reduced. There is moderately elevated pulmonary artery systolic pressure. The tricuspid  regurgitant velocity is 2.91 m/s, and with an assumed right atrial pressure of 15 mmHg, the estimated right ventricular systolic pressure is 64.4 mmHg. Left Atrium: Left atrial size was mildly dilated. Right Atrium: Right atrial size was mildly dilated. Pericardium: There is no evidence of pericardial effusion. Mitral Valve: The mitral valve is normal in structure. No evidence of mitral valve regurgitation. No evidence of mitral valve stenosis. Tricuspid Valve: The tricuspid valve is normal in structure. Tricuspid valve regurgitation is trivial. Aortic Valve: The aortic valve is tricuspid. Aortic valve regurgitation is mild. Aortic regurgitation PHT measures 548 msec. Mild aortic valve sclerosis is present, with no evidence of aortic valve stenosis. Pulmonic Valve: The  pulmonic valve was normal in structure. Pulmonic valve regurgitation is not visualized. Aorta: The aortic root is normal in size and structure. Venous: The inferior vena cava is dilated in size with less than 50% respiratory variability, suggesting right atrial pressure of 15 mmHg. IAS/Shunts: No atrial level shunt detected by color flow Doppler.  LEFT VENTRICLE PLAX 2D LVIDd:         4.00 cm  Diastology LVIDs:         3.30 cm  LV e' lateral:   4.90 cm/s LV PW:         1.20 cm  LV E/e' lateral: 14.4 LV IVS:        1.10 cm  LV e' medial:    5.00 cm/s LVOT diam:     1.60 cm  LV E/e' medial:  14.1 LV SV:  39 LV SV Index:   20 LVOT Area:     2.01 cm  RIGHT VENTRICLE            IVC RV Basal diam:  2.60 cm    IVC diam: 2.30 cm RV S prime:     6.64 cm/s TAPSE (M-mode): 1.3 cm LEFT ATRIUM             Index       RIGHT ATRIUM           Index LA diam:        3.40 cm 1.72 cm/m  RA Area:     13.60 cm LA Vol (A2C):   50.3 ml 25.43 ml/m RA Volume:   34.60 ml  17.49 ml/m LA Vol (A4C):   22.9 ml 11.58 ml/m LA Biplane Vol: 34.4 ml 17.39 ml/m  AORTIC VALVE LVOT Vmax:   102.00 cm/s LVOT Vmean:  71.600 cm/s LVOT VTI:    0.193 m AI PHT:      548 msec  AORTA Ao Root diam: 2.50 cm Ao Asc diam:  2.70 cm MITRAL VALVE               TRICUSPID VALVE MV Area (PHT): 3.72 cm    TR Peak grad:   33.9 mmHg MV Decel Time: 204 msec    TR Vmax:        291.00 cm/s MV E velocity: 70.40 cm/s MV A velocity: 75.10 cm/s  SHUNTS MV E/A ratio:  0.94        Systemic VTI:  0.19 m                            Systemic Diam: 1.60 cm Loralie Champagne MD Electronically signed by Loralie Champagne MD Signature Date/Time: 07/22/2020/5:23:39 PM    Final    VAS [place] LOWER EXTREMITY VENOUS (DVT)  Result Date: 07/24/2020  Lower Venous DVTStudy Indications: Pulmonary embolism.  Comparison Study: no prior Performing Technologist: Abram Sander RVS  Examination Guidelines: A complete evaluation includes B-mode imaging, spectral Doppler, color Doppler, and power Doppler  as needed of all accessible portions of each vessel. Bilateral testing is considered an integral part of a complete examination. Limited examinations for reoccurring indications may be performed as noted. The reflux portion of the exam is performed with the patient in reverse Trendelenburg.  +---------+---------------+---------+-----------+----------+--------------+ RIGHT    CompressibilityPhasicitySpontaneityPropertiesThrombus Aging +---------+---------------+---------+-----------+----------+--------------+ CFV      Full           Yes      Yes                                 +---------+---------------+---------+-----------+----------+--------------+ SFJ      Full                                                        +---------+---------------+---------+-----------+----------+--------------+ FV Prox  Full                                                        +---------+---------------+---------+-----------+----------+--------------+ FV Mid  Full                                                        +---------+---------------+---------+-----------+----------+--------------+ FV DistalFull                                                        +---------+---------------+---------+-----------+----------+--------------+ PFV      Full                                                        +---------+---------------+---------+-----------+----------+--------------+ POP      Full           Yes      Yes                                 +---------+---------------+---------+-----------+----------+--------------+ PTV      Full                                                        +---------+---------------+---------+-----------+----------+--------------+ PERO                                                  Not visualized +---------+---------------+---------+-----------+----------+--------------+    +---------+---------------+---------+-----------+----------+-----------------+ LEFT     CompressibilityPhasicitySpontaneityPropertiesThrombus Aging    +---------+---------------+---------+-----------+----------+-----------------+ CFV      Full           Yes      Yes                                    +---------+---------------+---------+-----------+----------+-----------------+ SFJ      Full                                                           +---------+---------------+---------+-----------+----------+-----------------+ FV Prox  Full                                                           +---------+---------------+---------+-----------+----------+-----------------+ FV Mid   None  Age Indeterminate +---------+---------------+---------+-----------+----------+-----------------+ FV DistalNone                                         Age Indeterminate +---------+---------------+---------+-----------+----------+-----------------+ PFV      Full                                                           +---------+---------------+---------+-----------+----------+-----------------+ POP      None           No       No                   Age Indeterminate +---------+---------------+---------+-----------+----------+-----------------+ PTV      None                                         Age Indeterminate +---------+---------------+---------+-----------+----------+-----------------+ PERO                                                  Not visualized    +---------+---------------+---------+-----------+----------+-----------------+     Summary: RIGHT: - There is no evidence of deep vein thrombosis in the lower extremity.  - No cystic structure found in the popliteal fossa.  LEFT: - Findings consistent with age indeterminate deep vein thrombosis involving the left popliteal vein, and left posterior tibial veins. - No  cystic structure found in the popliteal fossa.  *See table(s) above for measurements and observations. Electronically signed by Servando Snare MD on 07/24/2020 at 4:50:49 PM.    Final     (Echo, Carotid, EGD, Colonoscopy, ERCP)    Subjective: Patient was seen and examined.  No overnight events.  She feels isolated and misses her daughter to talk.  She recently lost her husband and is feeling more isolated since then. Looking forward for rehab.  Extremely weak on mobility.   Discharge Exam: Vitals:   07/28/20 2111 07/29/20 0533  BP: (!) 146/63 (!) 151/74  Pulse: (!) 58 (!) 53  Resp: 16 16  Temp: 97.7 F (36.5 C) 97.9 F (36.6 C)  SpO2: 93% 98%   Vitals:   07/28/20 0559 07/28/20 1330 07/28/20 2111 07/29/20 0533  BP: (!) 170/73 (!) 129/55 (!) 146/63 (!) 151/74  Pulse: (!) 52 (!) 59 (!) 58 (!) 53  Resp: 20 20 16 16   Temp: (!) 97.4 F (36.3 C) 99.2 F (37.3 C) 97.7 F (36.5 C) 97.9 F (36.6 C)  TempSrc: Oral     SpO2: 96% 100% 93% 98%  Weight:      Height:        General: Pt is alert, awake, not in acute distress Looks appropriate for age.  She is on room air. Cardiovascular: RRR, S1/S2 +, no rubs, no gallops Respiratory: CTA bilaterally, no wheezing, no rhonchi Abdominal: Soft, NT, ND, bowel sounds + Extremities: Trace bilateral pedal edema with no localized tenderness.  No cyanosis    The results of significant diagnostics from this hospitalization (including imaging, microbiology, ancillary and laboratory) are  listed below for reference.     Microbiology: Recent Results (from the past 240 hour(s))  SARS Coronavirus 2 by RT PCR (hospital order, performed in [facility] hospital lab) Nasopharyngeal Nasopharyngeal Swab     Status: Abnormal   Collection Time: 07/21/20  8:13 PM   Specimen: Nasopharyngeal Swab  Result Value Ref Range Status   SARS Coronavirus 2 POSITIVE (A) NEGATIVE Final    Comment: RESULT CALLED TO, READ BACK BY AND VERIFIED WITH: RN Alanson Aly AT  2239 07/21/20 [place] A (NOTE) SARS-CoV-2 target nucleic acids are DETECTED  SARS-CoV-2 RNA is generally detectable in upper respiratory specimens  during the acute phase of infection.  Positive results are indicative  of the presence of the identified virus, but do not rule out bacterial infection or co-infection with other pathogens not detected by the test.  Clinical correlation with patient history and  other diagnostic information is necessary to determine patient infection status.  The expected result is negative.  Fact Sheet for Patients:   StrictlyIdeas.no   Fact Sheet for Healthcare Providers:   BankingDealers.co.za    This test is not yet approved or cleared by the [place] FDA and  has been authorized for detection and/or diagnosis of SARS-CoV-2 by FDA under an Emergency Use Authorization (EUA).  This EUA will remain in effect (mean ing this test can be used) for the duration of  the COVID-19 declaration under Section 564(b)(1) of the Act, 21 U.S.C. section 360-bbb-3(b)(1), unless the authorization is terminated or revoked sooner.  Performed at [facility], [place] [address], [place], [place] [address]   Blood Culture (routine x 2)     Status: None   Collection Time: 07/21/20  8:13 PM   Specimen: BLOOD  Result Value Ref Range Status   Specimen Description   Final    BLOOD LEFT ANTECUBITAL Performed at [place] [address] [place], [place] [address]    Special Requests   Final    BOTTLES DRAWN AEROBIC AND ANAEROBIC Blood Culture adequate volume Performed at [place] [address], [place], [place] [address]    Culture   Final    NO GROWTH 5 DAYS Performed at [facility], [place] [address] [place], [place] [address]    Report Status 07/26/2020 FINAL  Final  Blood Culture (routine x 2)     Status: Abnormal   Collection Time: 07/21/20   8:13 PM   Specimen: BLOOD RIGHT HAND  Result Value Ref Range Status   Specimen Description   Final    BLOOD RIGHT HAND Performed at [facility], [place] [address] [place], [place] [address]    Special Requests   Final    BOTTLES DRAWN AEROBIC AND ANAEROBIC Blood Culture results may not be optimal due to an inadequate volume of blood received in culture bottles Performed at [place] [address] [place], [place] [address]    Culture  Setup Time   Final    GRAM POSITIVE COCCI IN CLUSTERS IN BOTH AEROBIC AND ANAEROBIC BOTTLES CRITICAL RESULT CALLED TO, READ BACK BY AND VERIFIED WITH: Sheffield Slider [facility] 07/23/20 0125 JDW    Culture (A)  Final    STAPHYLOCOCCUS EPIDERMIDIS THE SIGNIFICANCE OF ISOLATING THIS ORGANISM FROM A SINGLE SET OF BLOOD CULTURES WHEN MULTIPLE SETS ARE DRAWN IS UNCERTAIN. PLEASE NOTIFY THE MICROBIOLOGY DEPARTMENT WITHIN ONE WEEK IF SPECIATION AND SENSITIVITIES ARE REQUIRED. Performed at [facility], [address] [place], [place] [address]    Report Status 07/24/2020 FINAL  Final  Blood Culture ID Panel (Reflexed)     Status: Abnormal   Collection Time: 07/21/20  8:13 PM  Result Value Ref Range Status   Enterococcus faecalis NOT DETECTED NOT DETECTED Final   Enterococcus Faecium NOT DETECTED NOT DETECTED Final   Listeria monocytogenes NOT DETECTED NOT DETECTED Final   Staphylococcus species DETECTED (A) NOT DETECTED Final    Comment: CRITICAL RESULT CALLED TO, READ BACK BY AND VERIFIED WITH: Sheffield Slider PHARMD 07/23/20 0125    Staphylococcus aureus (BCID) NOT DETECTED NOT DETECTED Final   Staphylococcus epidermidis DETECTED (A) NOT DETECTED Final    Comment: Methicillin (oxacillin) resistant coagulase negative staphylococcus. Possible blood culture contaminant (unless isolated from more than one blood culture draw or clinical case suggests pathogenicity). No antibiotic treatment is indicated for blood  culture  contaminants. CRITICAL RESULT CALLED TO, READ BACK BY AND VERIFIED WITH: Sheffield Slider [facility] 07/23/20 0125 JDW    Staphylococcus lugdunensis NOT DETECTED NOT DETECTED Final   Streptococcus species NOT DETECTED NOT DETECTED Final   Streptococcus agalactiae NOT DETECTED NOT DETECTED Final   Streptococcus pneumoniae NOT DETECTED NOT DETECTED Final   Streptococcus pyogenes NOT DETECTED NOT DETECTED Final   A.calcoaceticus-baumannii NOT DETECTED NOT DETECTED Final   Bacteroides fragilis NOT DETECTED NOT DETECTED Final   Enterobacterales NOT DETECTED NOT DETECTED Final   Enterobacter cloacae complex NOT DETECTED NOT DETECTED Final   Escherichia coli NOT DETECTED NOT DETECTED Final   Klebsiella aerogenes NOT DETECTED NOT DETECTED Final   Klebsiella oxytoca NOT DETECTED NOT DETECTED Final   Klebsiella pneumoniae NOT DETECTED NOT DETECTED Final   Proteus species NOT DETECTED NOT DETECTED Final   Salmonella species NOT DETECTED NOT DETECTED Final   Serratia marcescens NOT DETECTED NOT DETECTED Final   Haemophilus influenzae NOT DETECTED NOT DETECTED Final   Neisseria meningitidis NOT DETECTED NOT DETECTED Final   Pseudomonas aeruginosa NOT DETECTED NOT DETECTED Final   Stenotrophomonas maltophilia NOT DETECTED NOT DETECTED Final   Candida albicans NOT DETECTED NOT DETECTED Final   Candida auris NOT DETECTED NOT DETECTED Final   Candida glabrata NOT DETECTED NOT DETECTED Final   Candida krusei NOT DETECTED NOT DETECTED Final   Candida parapsilosis NOT DETECTED NOT DETECTED Final   Candida tropicalis NOT DETECTED NOT DETECTED Final   Cryptococcus neoformans/gattii NOT DETECTED NOT DETECTED Final   Methicillin resistance mecA/C DETECTED (A) NOT DETECTED Final    Comment: CRITICAL RESULT CALLED TO, READ BACK BY AND VERIFIED WITH: Sheffield Slider [facility] 07/23/20 0125 JDW Performed at [facility], [address], [place], [place] [address]      Labs: BNP (last 3 results) Recent Labs     07/22/20 0442  BNP 465.6*   Basic Metabolic Panel: Recent Labs  Lab 07/23/20 0454 07/24/20 0049 07/25/20 0452 07/26/20 0329 07/27/20 0418  NA 138 139 140 140 145  K 4.2 4.1 3.5 3.4* 5.0  CL 104 105 108 107 110  CO2 25 22 23 25 25   GLUCOSE 128* 103* 132* 108* 117*  BUN 28* 24* 21 22 21   CREATININE 0.88 0.78 0.58 0.67 0.67  CALCIUM 8.7* 8.3* 8.3* 8.1* 8.4*   Liver Function Tests: Recent Labs  Lab 07/23/20 0454 07/24/20 0049 07/25/20 0452 07/26/20 0329 07/27/20 0418  AST 26 23 21 19 25   ALT 13 13 13 14 18   ALKPHOS 58 51 54 51 53  BILITOT 0.8 0.7 0.5 0.6 0.6  PROT 7.8 7.0 6.7 6.4* 6.4*  ALBUMIN 3.0* 2.8* 2.7* 2.5* 2.6*   No results for input(s): LIPASE, AMYLASE in the last 168 hours. No results for input(s): AMMONIA in the last 168 hours. CBC: Recent Labs  Lab 07/23/20 0454 07/24/20 0049 07/25/20 0452  WBC 16.5* 15.7* 10.3  HGB 12.7 12.1 12.2  HCT 39.8 38.6 38.2  MCV 89.2 91.7 90.3  PLT 187 216 221   Cardiac Enzymes: No results for input(s): CKTOTAL, CKMB, CKMBINDEX, TROPONINI in the last 168 hours. BNP: Invalid input(s): POCBNP CBG: No results for input(s): GLUCAP in the last 168 hours. D-Dimer No results for input(s): DDIMER in the last 72 hours. Hgb A1c No results for input(s): HGBA1C in the last 72 hours. Lipid Profile No results for input(s): CHOL, HDL, LDLCALC, TRIG, CHOLHDL, LDLDIRECT in the last 72 hours. Thyroid function studies No results for input(s): TSH, T4TOTAL, T3FREE, THYROIDAB in the last 72 hours.  Invalid input(s): FREET3 Anemia work up No results for input(s): VITAMINB12, FOLATE, FERRITIN, TIBC, IRON, RETICCTPCT in the last 72 hours. Urinalysis    Component Value Date/Time   COLORURINE YELLOW 07/22/2020 0421   APPEARANCEUR CLEAR 07/22/2020 0421   LABSPEC 1.010 07/22/2020 0421   PHURINE 5.0 07/22/2020 0421   GLUCOSEU NEGATIVE 07/22/2020 0421   HGBUR NEGATIVE 07/22/2020 0421   BILIRUBINUR NEGATIVE 07/22/2020 0421   KETONESUR  5 (A) 07/22/2020 0421   PROTEINUR 30 (A) 07/22/2020 0421   UROBILINOGEN 0.2 02/21/2014 1346   NITRITE NEGATIVE 07/22/2020 0421   LEUKOCYTESUR NEGATIVE 07/22/2020 0421   Sepsis Labs Invalid input(s): PROCALCITONIN,  WBC,  LACTICIDVEN Microbiology Recent Results (from the past 240 hour(s))  SARS Coronavirus 2 by RT PCR (hospital order, performed in [place] hospital lab) Nasopharyngeal Nasopharyngeal Swab     Status: Abnormal   Collection Time: 07/21/20  8:13 PM   Specimen: Nasopharyngeal Swab  Result Value Ref Range Status   SARS Coronavirus 2 POSITIVE (A) NEGATIVE Final    Comment: RESULT CALLED TO, READ BACK BY AND VERIFIED WITH: RN Alanson Aly AT 2239 07/21/20 CRU[place]ANK A (NOTE) SARS-CoV-2 target nucleic acids are DETECTED  SARS-CoV-2 RNA is generally detectable in upper respiratory specimens  during the acute phase of infection.  Positive results are indicative  of the presence of the identified virus, but do not rule out bacterial infection or co-infection with other pathogens not detected by the test.  Clinical correlation with patient history and  other diagnostic information is necessary to determine patient infection status.  The expected result is negative.  Fact Sheet for Patients:   StrictlyIdeas.no   Fact Sheet for Healthcare Providers:   BankingDealers.co.za    This test is not yet approved or cleared by the [place] FDA and  has been authorized for detection and/or diagnosis of SARS-CoV-2 by FDA under an Emergency Use Authorization (EUA).  This EUA will remain in effect (mean ing this test can be used) for the duration of  the COVID-19 declaration under Section 564(b)(1) of the Act, 21 U.S.C. section 360-bbb-3(b)(1), unless the authorization is terminated or revoked sooner.  Performed at [facility], [place] [address] [place], [place] [address]   Blood Culture (routine x 2)     Status:  None   Collection Time: 07/21/20  8:13 PM   Specimen: BLOOD  Result Value Ref Range Status   Specimen Description   Final    BLOOD LEFT ANTECUBITAL Performed at [place] [address], [place], [place] [address]  Special Requests   Final    BOTTLES DRAWN AEROBIC AND ANAEROBIC Blood Culture adequate volume Performed at [place] [address] [place], [place] [address]    Culture   Final    NO GROWTH 5 DAYS Performed at [facility], [place] [address], [place], [place] [address]    Report Status 07/26/2020 FINAL  Final  Blood Culture (routine x 2)     Status: Abnormal   Collection Time: 07/21/20  8:13 PM   Specimen: BLOOD RIGHT HAND  Result Value Ref Range Status   Specimen Description   Final    BLOOD RIGHT HAND Performed at [facility], [place] [address], [place], [place] [address]    Special Requests   Final    BOTTLES DRAWN AEROBIC AND ANAEROBIC Blood Culture results may not be optimal due to an inadequate volume of blood received in culture bottles Performed at [facility] [address], [place], [place] [address]    Culture  Setup Time   Final    GRAM POSITIVE COCCI IN CLUSTERS IN BOTH AEROBIC AND ANAEROBIC BOTTLES CRITICAL RESULT CALLED TO, READ BACK BY AND VERIFIED WITH: Sheffield Slider [facility] 07/23/20 0125 JDW    Culture (A)  Final    STAPHYLOCOCCUS EPIDERMIDIS THE SIGNIFICANCE OF ISOLATING THIS ORGANISM FROM A SINGLE SET OF BLOOD CULTURES WHEN MULTIPLE SETS ARE DRAWN IS UNCERTAIN. PLEASE NOTIFY THE MICROBIOLOGY DEPARTMENT WITHIN ONE WEEK IF SPECIATION AND SENSITIVITIES ARE REQUIRED. Performed at [facility], [place] [address] [place], [place] [address]    Report Status 07/24/2020 FINAL  Final  Blood Culture ID Panel (Reflexed)     Status: Abnormal   Collection Time: 07/21/20  8:13 PM  Result Value Ref Range Status   Enterococcus faecalis NOT DETECTED NOT DETECTED Final   Enterococcus  Faecium NOT DETECTED NOT DETECTED Final   Listeria monocytogenes NOT DETECTED NOT DETECTED Final   Staphylococcus species DETECTED (A) NOT DETECTED Final    Comment: CRITICAL RESULT CALLED TO, READ BACK BY AND VERIFIED WITH: Sheffield Slider PHARMD 07/23/20 0125    Staphylococcus aureus (BCID) NOT DETECTED NOT DETECTED Final   Staphylococcus epidermidis DETECTED (A) NOT DETECTED Final    Comment: Methicillin (oxacillin) resistant coagulase negative staphylococcus. Possible blood culture contaminant (unless isolated from more than one blood culture draw or clinical case suggests pathogenicity). No antibiotic treatment is indicated for blood  culture contaminants. CRITICAL RESULT CALLED TO, READ BACK BY AND VERIFIED WITH: Sheffield Slider [facility] 07/23/20 0125 JDW    Staphylococcus lugdunensis NOT DETECTED NOT DETECTED Final   Streptococcus species NOT DETECTED NOT DETECTED Final   Streptococcus agalactiae NOT DETECTED NOT DETECTED Final   Streptococcus pneumoniae NOT DETECTED NOT DETECTED Final   Streptococcus pyogenes NOT DETECTED NOT DETECTED Final   A.calcoaceticus-baumannii NOT DETECTED NOT DETECTED Final   Bacteroides fragilis NOT DETECTED NOT DETECTED Final   Enterobacterales NOT DETECTED NOT DETECTED Final   Enterobacter cloacae complex NOT DETECTED NOT DETECTED Final   Escherichia coli NOT DETECTED NOT DETECTED Final   Klebsiella aerogenes NOT DETECTED NOT DETECTED Final   Klebsiella oxytoca NOT DETECTED NOT DETECTED Final   Klebsiella pneumoniae NOT DETECTED NOT DETECTED Final   Proteus species NOT DETECTED NOT DETECTED Final   Salmonella species NOT DETECTED NOT DETECTED Final   Serratia marcescens NOT DETECTED NOT DETECTED Final   Haemophilus influenzae NOT DETECTED NOT DETECTED Final   Neisseria meningitidis NOT DETECTED NOT DETECTED Final   Pseudomonas  aeruginosa NOT DETECTED NOT DETECTED Final   Stenotrophomonas maltophilia NOT DETECTED NOT DETECTED Final   Candida albicans NOT  DETECTED NOT DETECTED Final   Candida auris NOT DETECTED NOT DETECTED Final   Candida glabrata NOT DETECTED NOT DETECTED Final   Candida krusei NOT DETECTED NOT DETECTED Final   Candida parapsilosis NOT DETECTED NOT DETECTED Final   Candida tropicalis NOT DETECTED NOT DETECTED Final   Cryptococcus neoformans/gattii NOT DETECTED NOT DETECTED Final   Methicillin resistance mecA/C DETECTED (A) NOT DETECTED Final    Comment: CRITICAL RESULT CALLED TO, READ BACK BY AND VERIFIED WITH: Sheffield Slider [facility] 07/23/20 0125 JDW Performed at [facility], [address], [place], [place] [address]      Time coordinating discharge:  35 minutes  SIGNED:   Barb Merino, MD  [facility] 07/29/2020, 11:38 AM

## 2020-07-29 NOTE — TOC Transition Note (Signed)
Transition of Care [facility]) - CM/SW Discharge Note   Patient Details  Name: Jaime [name] MRN: [id number] Date of Birth: July 06, 1932  Transition of Care [facility] CM/SW Contact:  Trish Mage, LCSW Phone Number: 07/29/2020, 2:07 PM   Clinical Narrative:  Patient to d/c to [facility] today.  Family informed.  PTAR arranged.  Nursing, please call report to [place] at [phone]. Room 803A. TOC sign off.    Final next level of care: Skilled Nursing Facility Barriers to Discharge: No Barriers Identified   Patient Goals and CMS Choice Patient states their goals for this hospitalization and ongoing recovery are:: To get better   Choice offered to / list presented to : Patient, Adult Children  Discharge Placement                       Discharge Plan and Services                                     Social Determinants of Health (SDOH) Interventions     Readmission Risk Interventions No flowsheet data found.

## 2020-07-29 NOTE — Progress Notes (Signed)
Patient is currently discharging to [facility] with ambulance. Patient has her belongings with her. Daughter is waiting at [facility] for her.

## 2020-07-30 DIAGNOSIS — I2602 Saddle embolus of pulmonary artery with acute cor pulmonale: Secondary | ICD-10-CM | POA: Diagnosis not present

## 2020-07-30 DIAGNOSIS — M6259 Muscle wasting and atrophy, not elsewhere classified, multiple sites: Secondary | ICD-10-CM | POA: Diagnosis not present

## 2020-07-30 DIAGNOSIS — U071 COVID-19: Secondary | ICD-10-CM | POA: Diagnosis not present

## 2020-07-30 DIAGNOSIS — J9601 Acute respiratory failure with hypoxia: Secondary | ICD-10-CM | POA: Diagnosis not present

## 2020-08-08 DIAGNOSIS — U071 COVID-19: Secondary | ICD-10-CM | POA: Diagnosis not present

## 2020-08-08 DIAGNOSIS — I2602 Saddle embolus of pulmonary artery with acute cor pulmonale: Secondary | ICD-10-CM | POA: Diagnosis not present

## 2020-08-08 DIAGNOSIS — M1711 Unilateral primary osteoarthritis, right knee: Secondary | ICD-10-CM | POA: Diagnosis not present

## 2020-08-08 DIAGNOSIS — M6259 Muscle wasting and atrophy, not elsewhere classified, multiple sites: Secondary | ICD-10-CM | POA: Diagnosis not present

## 2020-08-12 ENCOUNTER — Other Ambulatory Visit: Payer: Self-pay | Admitting: *Deleted

## 2020-08-12 NOTE — Patient Outreach (Signed)
[facility] Post- Acute Care Coordinator follow up. Member screened for potential [facility] Care Management needs as a benefit of [place] Medicare.  Collaboration with [place]. Member's transition plan is to return home with daughter. Discussed that writer has left message for Ms. Hannis's daughter Baker Janus. Awaiting return call. SW indicates there is not a dc date set yet.  Will continue to follow for transition plans and potential [facility] Care Management needs.   Marthenia Rolling, MSN-Ed, RN,BSN [place] Acute Care Coordinator [phone] [facility]) [phone]  (Toll free office)

## 2020-08-12 NOTE — Patient Outreach (Signed)
Member screened for potential [facility] Care Management needs as a benefit of [place] Medicare.  Verified in Patient Jaime [name] that member resides in [facility] Attempted to reach member's daughter [name] [phone] to inquire about transition plans. No answer. HIPAA compliant voicemail message left requesting return call.  Communication sent to facility SW to collaborate about transition plans and potential [facility] needs.    Marthenia Rolling, MSN-Ed, RN,BSN [place] Acute Care Coordinator [phone] [facility]) [phone]  (Toll free office)

## 2020-08-27 DIAGNOSIS — R413 Other amnesia: Secondary | ICD-10-CM | POA: Diagnosis not present

## 2020-08-27 DIAGNOSIS — R82998 Other abnormal findings in urine: Secondary | ICD-10-CM | POA: Diagnosis not present

## 2020-08-27 DIAGNOSIS — M8580 Other specified disorders of bone density and structure, unspecified site: Secondary | ICD-10-CM | POA: Diagnosis not present

## 2020-08-27 DIAGNOSIS — Z23 Encounter for immunization: Secondary | ICD-10-CM | POA: Diagnosis not present

## 2020-08-27 DIAGNOSIS — R269 Unspecified abnormalities of gait and mobility: Secondary | ICD-10-CM | POA: Diagnosis not present

## 2020-08-27 DIAGNOSIS — F329 Major depressive disorder, single episode, unspecified: Secondary | ICD-10-CM | POA: Diagnosis not present

## 2020-08-27 DIAGNOSIS — I2609 Other pulmonary embolism with acute cor pulmonale: Secondary | ICD-10-CM | POA: Diagnosis not present

## 2020-08-27 DIAGNOSIS — I872 Venous insufficiency (chronic) (peripheral): Secondary | ICD-10-CM | POA: Diagnosis not present

## 2020-08-27 DIAGNOSIS — N390 Urinary tract infection, site not specified: Secondary | ICD-10-CM | POA: Diagnosis not present

## 2020-08-27 DIAGNOSIS — E785 Hyperlipidemia, unspecified: Secondary | ICD-10-CM | POA: Diagnosis not present

## 2020-08-28 DIAGNOSIS — Z7901 Long term (current) use of anticoagulants: Secondary | ICD-10-CM | POA: Diagnosis not present

## 2020-08-28 DIAGNOSIS — K5900 Constipation, unspecified: Secondary | ICD-10-CM | POA: Diagnosis not present

## 2020-08-28 DIAGNOSIS — I82402 Acute embolism and thrombosis of unspecified deep veins of left lower extremity: Secondary | ICD-10-CM | POA: Diagnosis not present

## 2020-08-28 DIAGNOSIS — Z96651 Presence of right artificial knee joint: Secondary | ICD-10-CM | POA: Diagnosis not present

## 2020-08-28 DIAGNOSIS — H4010X0 Unspecified open-angle glaucoma, stage unspecified: Secondary | ICD-10-CM | POA: Diagnosis not present

## 2020-08-28 DIAGNOSIS — I119 Hypertensive heart disease without heart failure: Secondary | ICD-10-CM | POA: Diagnosis not present

## 2020-08-28 DIAGNOSIS — D649 Anemia, unspecified: Secondary | ICD-10-CM | POA: Diagnosis not present

## 2020-08-28 DIAGNOSIS — R911 Solitary pulmonary nodule: Secondary | ICD-10-CM | POA: Diagnosis not present

## 2020-08-28 DIAGNOSIS — J309 Allergic rhinitis, unspecified: Secondary | ICD-10-CM | POA: Diagnosis not present

## 2020-08-28 DIAGNOSIS — I2609 Other pulmonary embolism with acute cor pulmonale: Secondary | ICD-10-CM | POA: Diagnosis not present

## 2020-08-28 DIAGNOSIS — U071 COVID-19: Secondary | ICD-10-CM | POA: Diagnosis not present

## 2020-08-28 DIAGNOSIS — M810 Age-related osteoporosis without current pathological fracture: Secondary | ICD-10-CM | POA: Diagnosis not present

## 2020-08-28 DIAGNOSIS — E782 Mixed hyperlipidemia: Secondary | ICD-10-CM | POA: Diagnosis not present

## 2020-08-28 DIAGNOSIS — D62 Acute posthemorrhagic anemia: Secondary | ICD-10-CM | POA: Diagnosis not present

## 2020-08-28 DIAGNOSIS — I2602 Saddle embolus of pulmonary artery with acute cor pulmonale: Secondary | ICD-10-CM | POA: Diagnosis not present

## 2020-08-28 DIAGNOSIS — M6259 Muscle wasting and atrophy, not elsewhere classified, multiple sites: Secondary | ICD-10-CM | POA: Diagnosis not present

## 2020-08-28 DIAGNOSIS — J9601 Acute respiratory failure with hypoxia: Secondary | ICD-10-CM | POA: Diagnosis not present

## 2020-08-28 DIAGNOSIS — K219 Gastro-esophageal reflux disease without esophagitis: Secondary | ICD-10-CM | POA: Diagnosis not present

## 2020-08-28 DIAGNOSIS — Z9181 History of falling: Secondary | ICD-10-CM | POA: Diagnosis not present

## 2020-08-28 DIAGNOSIS — R202 Paresthesia of skin: Secondary | ICD-10-CM | POA: Diagnosis not present

## 2020-08-28 DIAGNOSIS — M1991 Primary osteoarthritis, unspecified site: Secondary | ICD-10-CM | POA: Diagnosis not present

## 2020-09-01 DIAGNOSIS — I2602 Saddle embolus of pulmonary artery with acute cor pulmonale: Secondary | ICD-10-CM | POA: Diagnosis not present

## 2020-09-01 DIAGNOSIS — J9601 Acute respiratory failure with hypoxia: Secondary | ICD-10-CM | POA: Diagnosis not present

## 2020-09-01 DIAGNOSIS — U071 COVID-19: Secondary | ICD-10-CM | POA: Diagnosis not present

## 2020-09-01 DIAGNOSIS — H4010X0 Unspecified open-angle glaucoma, stage unspecified: Secondary | ICD-10-CM | POA: Diagnosis not present

## 2020-09-01 DIAGNOSIS — K219 Gastro-esophageal reflux disease without esophagitis: Secondary | ICD-10-CM | POA: Diagnosis not present

## 2020-09-01 DIAGNOSIS — M1991 Primary osteoarthritis, unspecified site: Secondary | ICD-10-CM | POA: Diagnosis not present

## 2020-09-01 DIAGNOSIS — J309 Allergic rhinitis, unspecified: Secondary | ICD-10-CM | POA: Diagnosis not present

## 2020-09-01 DIAGNOSIS — D62 Acute posthemorrhagic anemia: Secondary | ICD-10-CM | POA: Diagnosis not present

## 2020-09-01 DIAGNOSIS — M810 Age-related osteoporosis without current pathological fracture: Secondary | ICD-10-CM | POA: Diagnosis not present

## 2020-09-01 DIAGNOSIS — E782 Mixed hyperlipidemia: Secondary | ICD-10-CM | POA: Diagnosis not present

## 2020-09-01 DIAGNOSIS — Z7901 Long term (current) use of anticoagulants: Secondary | ICD-10-CM | POA: Diagnosis not present

## 2020-09-01 DIAGNOSIS — I82402 Acute embolism and thrombosis of unspecified deep veins of left lower extremity: Secondary | ICD-10-CM | POA: Diagnosis not present

## 2020-09-01 DIAGNOSIS — I119 Hypertensive heart disease without heart failure: Secondary | ICD-10-CM | POA: Diagnosis not present

## 2020-09-01 DIAGNOSIS — K5900 Constipation, unspecified: Secondary | ICD-10-CM | POA: Diagnosis not present

## 2020-09-01 DIAGNOSIS — M6259 Muscle wasting and atrophy, not elsewhere classified, multiple sites: Secondary | ICD-10-CM | POA: Diagnosis not present

## 2020-09-01 DIAGNOSIS — R911 Solitary pulmonary nodule: Secondary | ICD-10-CM | POA: Diagnosis not present

## 2020-09-03 DIAGNOSIS — M810 Age-related osteoporosis without current pathological fracture: Secondary | ICD-10-CM | POA: Diagnosis not present

## 2020-09-03 DIAGNOSIS — E782 Mixed hyperlipidemia: Secondary | ICD-10-CM | POA: Diagnosis not present

## 2020-09-03 DIAGNOSIS — M1991 Primary osteoarthritis, unspecified site: Secondary | ICD-10-CM | POA: Diagnosis not present

## 2020-09-03 DIAGNOSIS — H4010X0 Unspecified open-angle glaucoma, stage unspecified: Secondary | ICD-10-CM | POA: Diagnosis not present

## 2020-09-03 DIAGNOSIS — M6259 Muscle wasting and atrophy, not elsewhere classified, multiple sites: Secondary | ICD-10-CM | POA: Diagnosis not present

## 2020-09-03 DIAGNOSIS — R911 Solitary pulmonary nodule: Secondary | ICD-10-CM | POA: Diagnosis not present

## 2020-09-03 DIAGNOSIS — K219 Gastro-esophageal reflux disease without esophagitis: Secondary | ICD-10-CM | POA: Diagnosis not present

## 2020-09-03 DIAGNOSIS — I2602 Saddle embolus of pulmonary artery with acute cor pulmonale: Secondary | ICD-10-CM | POA: Diagnosis not present

## 2020-09-03 DIAGNOSIS — I82402 Acute embolism and thrombosis of unspecified deep veins of left lower extremity: Secondary | ICD-10-CM | POA: Diagnosis not present

## 2020-09-03 DIAGNOSIS — J309 Allergic rhinitis, unspecified: Secondary | ICD-10-CM | POA: Diagnosis not present

## 2020-09-03 DIAGNOSIS — K5900 Constipation, unspecified: Secondary | ICD-10-CM | POA: Diagnosis not present

## 2020-09-03 DIAGNOSIS — J9601 Acute respiratory failure with hypoxia: Secondary | ICD-10-CM | POA: Diagnosis not present

## 2020-09-03 DIAGNOSIS — I119 Hypertensive heart disease without heart failure: Secondary | ICD-10-CM | POA: Diagnosis not present

## 2020-09-03 DIAGNOSIS — Z7901 Long term (current) use of anticoagulants: Secondary | ICD-10-CM | POA: Diagnosis not present

## 2020-09-03 DIAGNOSIS — D62 Acute posthemorrhagic anemia: Secondary | ICD-10-CM | POA: Diagnosis not present

## 2020-09-03 DIAGNOSIS — U071 COVID-19: Secondary | ICD-10-CM | POA: Diagnosis not present

## 2020-09-04 DIAGNOSIS — K5900 Constipation, unspecified: Secondary | ICD-10-CM | POA: Diagnosis not present

## 2020-09-04 DIAGNOSIS — I2602 Saddle embolus of pulmonary artery with acute cor pulmonale: Secondary | ICD-10-CM | POA: Diagnosis not present

## 2020-09-04 DIAGNOSIS — M1991 Primary osteoarthritis, unspecified site: Secondary | ICD-10-CM | POA: Diagnosis not present

## 2020-09-04 DIAGNOSIS — H4010X0 Unspecified open-angle glaucoma, stage unspecified: Secondary | ICD-10-CM | POA: Diagnosis not present

## 2020-09-04 DIAGNOSIS — M810 Age-related osteoporosis without current pathological fracture: Secondary | ICD-10-CM | POA: Diagnosis not present

## 2020-09-04 DIAGNOSIS — U071 COVID-19: Secondary | ICD-10-CM | POA: Diagnosis not present

## 2020-09-04 DIAGNOSIS — I119 Hypertensive heart disease without heart failure: Secondary | ICD-10-CM | POA: Diagnosis not present

## 2020-09-04 DIAGNOSIS — I82402 Acute embolism and thrombosis of unspecified deep veins of left lower extremity: Secondary | ICD-10-CM | POA: Diagnosis not present

## 2020-09-04 DIAGNOSIS — J309 Allergic rhinitis, unspecified: Secondary | ICD-10-CM | POA: Diagnosis not present

## 2020-09-04 DIAGNOSIS — K219 Gastro-esophageal reflux disease without esophagitis: Secondary | ICD-10-CM | POA: Diagnosis not present

## 2020-09-04 DIAGNOSIS — M6259 Muscle wasting and atrophy, not elsewhere classified, multiple sites: Secondary | ICD-10-CM | POA: Diagnosis not present

## 2020-09-04 DIAGNOSIS — Z7901 Long term (current) use of anticoagulants: Secondary | ICD-10-CM | POA: Diagnosis not present

## 2020-09-04 DIAGNOSIS — J9601 Acute respiratory failure with hypoxia: Secondary | ICD-10-CM | POA: Diagnosis not present

## 2020-09-04 DIAGNOSIS — R911 Solitary pulmonary nodule: Secondary | ICD-10-CM | POA: Diagnosis not present

## 2020-09-04 DIAGNOSIS — D62 Acute posthemorrhagic anemia: Secondary | ICD-10-CM | POA: Diagnosis not present

## 2020-09-04 DIAGNOSIS — E782 Mixed hyperlipidemia: Secondary | ICD-10-CM | POA: Diagnosis not present

## 2020-09-08 DIAGNOSIS — K219 Gastro-esophageal reflux disease without esophagitis: Secondary | ICD-10-CM | POA: Diagnosis not present

## 2020-09-08 DIAGNOSIS — K5900 Constipation, unspecified: Secondary | ICD-10-CM | POA: Diagnosis not present

## 2020-09-08 DIAGNOSIS — E782 Mixed hyperlipidemia: Secondary | ICD-10-CM | POA: Diagnosis not present

## 2020-09-08 DIAGNOSIS — M810 Age-related osteoporosis without current pathological fracture: Secondary | ICD-10-CM | POA: Diagnosis not present

## 2020-09-08 DIAGNOSIS — U071 COVID-19: Secondary | ICD-10-CM | POA: Diagnosis not present

## 2020-09-08 DIAGNOSIS — M1991 Primary osteoarthritis, unspecified site: Secondary | ICD-10-CM | POA: Diagnosis not present

## 2020-09-08 DIAGNOSIS — J9601 Acute respiratory failure with hypoxia: Secondary | ICD-10-CM | POA: Diagnosis not present

## 2020-09-08 DIAGNOSIS — I82402 Acute embolism and thrombosis of unspecified deep veins of left lower extremity: Secondary | ICD-10-CM | POA: Diagnosis not present

## 2020-09-08 DIAGNOSIS — M6259 Muscle wasting and atrophy, not elsewhere classified, multiple sites: Secondary | ICD-10-CM | POA: Diagnosis not present

## 2020-09-08 DIAGNOSIS — H4010X0 Unspecified open-angle glaucoma, stage unspecified: Secondary | ICD-10-CM | POA: Diagnosis not present

## 2020-09-08 DIAGNOSIS — I2602 Saddle embolus of pulmonary artery with acute cor pulmonale: Secondary | ICD-10-CM | POA: Diagnosis not present

## 2020-09-08 DIAGNOSIS — I119 Hypertensive heart disease without heart failure: Secondary | ICD-10-CM | POA: Diagnosis not present

## 2020-09-08 DIAGNOSIS — Z7901 Long term (current) use of anticoagulants: Secondary | ICD-10-CM | POA: Diagnosis not present

## 2020-09-08 DIAGNOSIS — J309 Allergic rhinitis, unspecified: Secondary | ICD-10-CM | POA: Diagnosis not present

## 2020-09-08 DIAGNOSIS — R911 Solitary pulmonary nodule: Secondary | ICD-10-CM | POA: Diagnosis not present

## 2020-09-08 DIAGNOSIS — D62 Acute posthemorrhagic anemia: Secondary | ICD-10-CM | POA: Diagnosis not present

## 2020-09-09 DIAGNOSIS — I82402 Acute embolism and thrombosis of unspecified deep veins of left lower extremity: Secondary | ICD-10-CM | POA: Diagnosis not present

## 2020-09-09 DIAGNOSIS — U071 COVID-19: Secondary | ICD-10-CM | POA: Diagnosis not present

## 2020-09-09 DIAGNOSIS — I2602 Saddle embolus of pulmonary artery with acute cor pulmonale: Secondary | ICD-10-CM | POA: Diagnosis not present

## 2020-09-09 DIAGNOSIS — R911 Solitary pulmonary nodule: Secondary | ICD-10-CM | POA: Diagnosis not present

## 2020-09-09 DIAGNOSIS — K5900 Constipation, unspecified: Secondary | ICD-10-CM | POA: Diagnosis not present

## 2020-09-09 DIAGNOSIS — E782 Mixed hyperlipidemia: Secondary | ICD-10-CM | POA: Diagnosis not present

## 2020-09-09 DIAGNOSIS — J309 Allergic rhinitis, unspecified: Secondary | ICD-10-CM | POA: Diagnosis not present

## 2020-09-09 DIAGNOSIS — M6259 Muscle wasting and atrophy, not elsewhere classified, multiple sites: Secondary | ICD-10-CM | POA: Diagnosis not present

## 2020-09-09 DIAGNOSIS — I119 Hypertensive heart disease without heart failure: Secondary | ICD-10-CM | POA: Diagnosis not present

## 2020-09-09 DIAGNOSIS — H4010X0 Unspecified open-angle glaucoma, stage unspecified: Secondary | ICD-10-CM | POA: Diagnosis not present

## 2020-09-09 DIAGNOSIS — D62 Acute posthemorrhagic anemia: Secondary | ICD-10-CM | POA: Diagnosis not present

## 2020-09-09 DIAGNOSIS — Z7901 Long term (current) use of anticoagulants: Secondary | ICD-10-CM | POA: Diagnosis not present

## 2020-09-09 DIAGNOSIS — M810 Age-related osteoporosis without current pathological fracture: Secondary | ICD-10-CM | POA: Diagnosis not present

## 2020-09-09 DIAGNOSIS — J9601 Acute respiratory failure with hypoxia: Secondary | ICD-10-CM | POA: Diagnosis not present

## 2020-09-09 DIAGNOSIS — M1991 Primary osteoarthritis, unspecified site: Secondary | ICD-10-CM | POA: Diagnosis not present

## 2020-09-09 DIAGNOSIS — K219 Gastro-esophageal reflux disease without esophagitis: Secondary | ICD-10-CM | POA: Diagnosis not present

## 2020-09-10 DIAGNOSIS — U071 COVID-19: Secondary | ICD-10-CM | POA: Diagnosis not present

## 2020-09-10 DIAGNOSIS — R911 Solitary pulmonary nodule: Secondary | ICD-10-CM | POA: Diagnosis not present

## 2020-09-10 DIAGNOSIS — J309 Allergic rhinitis, unspecified: Secondary | ICD-10-CM | POA: Diagnosis not present

## 2020-09-10 DIAGNOSIS — M1991 Primary osteoarthritis, unspecified site: Secondary | ICD-10-CM | POA: Diagnosis not present

## 2020-09-10 DIAGNOSIS — M6259 Muscle wasting and atrophy, not elsewhere classified, multiple sites: Secondary | ICD-10-CM | POA: Diagnosis not present

## 2020-09-10 DIAGNOSIS — Z7901 Long term (current) use of anticoagulants: Secondary | ICD-10-CM | POA: Diagnosis not present

## 2020-09-10 DIAGNOSIS — E782 Mixed hyperlipidemia: Secondary | ICD-10-CM | POA: Diagnosis not present

## 2020-09-10 DIAGNOSIS — K5900 Constipation, unspecified: Secondary | ICD-10-CM | POA: Diagnosis not present

## 2020-09-10 DIAGNOSIS — I82402 Acute embolism and thrombosis of unspecified deep veins of left lower extremity: Secondary | ICD-10-CM | POA: Diagnosis not present

## 2020-09-10 DIAGNOSIS — I2602 Saddle embolus of pulmonary artery with acute cor pulmonale: Secondary | ICD-10-CM | POA: Diagnosis not present

## 2020-09-10 DIAGNOSIS — J9601 Acute respiratory failure with hypoxia: Secondary | ICD-10-CM | POA: Diagnosis not present

## 2020-09-10 DIAGNOSIS — M810 Age-related osteoporosis without current pathological fracture: Secondary | ICD-10-CM | POA: Diagnosis not present

## 2020-09-10 DIAGNOSIS — D62 Acute posthemorrhagic anemia: Secondary | ICD-10-CM | POA: Diagnosis not present

## 2020-09-10 DIAGNOSIS — I119 Hypertensive heart disease without heart failure: Secondary | ICD-10-CM | POA: Diagnosis not present

## 2020-09-10 DIAGNOSIS — K219 Gastro-esophageal reflux disease without esophagitis: Secondary | ICD-10-CM | POA: Diagnosis not present

## 2020-09-10 DIAGNOSIS — H4010X0 Unspecified open-angle glaucoma, stage unspecified: Secondary | ICD-10-CM | POA: Diagnosis not present

## 2020-09-13 ENCOUNTER — Other Ambulatory Visit: Payer: Self-pay

## 2020-09-13 ENCOUNTER — Emergency Department (HOSPITAL_COMMUNITY)
Admission: EM | Admit: 2020-09-13 | Discharge: 2020-09-13 | Disposition: A | Discharge status: Home / Self Care | Payer: Medicare Other | Attending: Emergency Medicine | Admitting: Emergency Medicine

## 2020-09-13 ENCOUNTER — Emergency Department (HOSPITAL_COMMUNITY): Payer: Medicare Other

## 2020-09-13 ENCOUNTER — Encounter (HOSPITAL_COMMUNITY): Payer: Self-pay | Admitting: Pharmacy Technician

## 2020-09-13 DIAGNOSIS — I10 Essential (primary) hypertension: Secondary | ICD-10-CM | POA: Insufficient documentation

## 2020-09-13 DIAGNOSIS — S0101XA Laceration without foreign body of scalp, initial encounter: Secondary | ICD-10-CM | POA: Diagnosis not present

## 2020-09-13 DIAGNOSIS — Y92009 Unspecified place in unspecified non-institutional (private) residence as the place of occurrence of the external cause: Secondary | ICD-10-CM | POA: Insufficient documentation

## 2020-09-13 DIAGNOSIS — S0990XA Unspecified injury of head, initial encounter: Secondary | ICD-10-CM | POA: Diagnosis not present

## 2020-09-13 DIAGNOSIS — W0110XA Fall on same level from slipping, tripping and stumbling with subsequent striking against unspecified object, initial encounter: Secondary | ICD-10-CM | POA: Insufficient documentation

## 2020-09-13 DIAGNOSIS — R6889 Other general symptoms and signs: Secondary | ICD-10-CM | POA: Diagnosis not present

## 2020-09-13 DIAGNOSIS — W19XXXA Unspecified fall, initial encounter: Secondary | ICD-10-CM

## 2020-09-13 DIAGNOSIS — Z7901 Long term (current) use of anticoagulants: Secondary | ICD-10-CM | POA: Diagnosis not present

## 2020-09-13 DIAGNOSIS — R9082 White matter disease, unspecified: Secondary | ICD-10-CM | POA: Diagnosis not present

## 2020-09-13 DIAGNOSIS — S199XXA Unspecified injury of neck, initial encounter: Secondary | ICD-10-CM | POA: Diagnosis not present

## 2020-09-13 DIAGNOSIS — Z743 Need for continuous supervision: Secondary | ICD-10-CM | POA: Diagnosis not present

## 2020-09-13 DIAGNOSIS — J90 Pleural effusion, not elsewhere classified: Secondary | ICD-10-CM | POA: Diagnosis not present

## 2020-09-13 DIAGNOSIS — M5030 Other cervical disc degeneration, unspecified cervical region: Secondary | ICD-10-CM | POA: Diagnosis not present

## 2020-09-13 HISTORY — DX: Essential (primary) hypertension: I10

## 2020-09-13 LAB — BASIC METABOLIC PANEL
Anion gap: 8 (ref 5–15)
BUN: 18 mg/dL (ref 8–23)
CO2: 21 mmol/L — ABNORMAL LOW (ref 22–32)
Calcium: 9.4 mg/dL (ref 8.9–10.3)
Chloride: 109 mmol/L (ref 98–111)
Creatinine, Ser: 1 mg/dL (ref 0.44–1.00)
GFR, Estimated: 55 mL/min — ABNORMAL LOW (ref >60)
Glucose, Bld: 113 mg/dL — ABNORMAL HIGH (ref 70–99)
Potassium: 4.3 mmol/L (ref 3.5–5.1)
Sodium: 138 mmol/L (ref 135–145)

## 2020-09-13 LAB — CBC WITH DIFFERENTIAL/PLATELET
Abs Immature Granulocytes: 0.01 10*3/uL (ref 0.00–0.07)
Basophils Absolute: 0 10*3/uL (ref 0.0–0.1)
Basophils Relative: 0 %
Eosinophils Absolute: 0.1 10*3/uL (ref 0.0–0.5)
Eosinophils Relative: 2 %
HCT: 35.1 % — ABNORMAL LOW (ref 36.0–46.0)
Hemoglobin: 10.6 g/dL — ABNORMAL LOW (ref 12.0–15.0)
Immature Granulocytes: 0 %
Lymphocytes Relative: 33 %
Lymphs Abs: 2.3 10*3/uL (ref 0.7–4.0)
MCH: 28.3 pg (ref 26.0–34.0)
MCHC: 30.2 g/dL (ref 30.0–36.0)
MCV: 93.9 fL (ref 80.0–100.0)
Monocytes Absolute: 1 10*3/uL (ref 0.1–1.0)
Monocytes Relative: 14 %
Neutro Abs: 3.5 10*3/uL (ref 1.7–7.7)
Neutrophils Relative %: 51 %
Platelets: 274 10*3/uL (ref 150–400)
RBC: 3.74 MIL/uL — ABNORMAL LOW (ref 3.87–5.11)
RDW: 14.8 % (ref 11.5–15.5)
WBC: 7 10*3/uL (ref 4.0–10.5)
nRBC: 0 % (ref 0.0–0.2)

## 2020-09-13 LAB — MAGNESIUM: Magnesium: 2.4 mg/dL (ref 1.7–2.4)

## 2020-09-13 NOTE — ED Notes (Signed)
Patient Alert and oriented to baseline. Stable and ambulatory to baseline. Patient verbalized understanding of the discharge instructions.  Patient belongings were taken by the patient.   

## 2020-09-13 NOTE — Discharge Instructions (Addendum)
Please follow up with your primary care doctor (PCP) for re-evaluation of lungs (evidence of more fluid-pleural effusion on CT) and resolving cellulitis left leg redness.

## 2020-09-13 NOTE — Progress Notes (Signed)
Orthopedic Tech Progress Note Patient Details:  Jaime [name] 1932-08-22 [id number] Level 2 trauma orthopedic visit. Patient ID: Jaime [name], female   DOB: 1932-09-25, 86 y.o.   MRN: [id number]   Jaime [name] 09/13/2020, 4:21 PM

## 2020-09-13 NOTE — ED Triage Notes (Signed)
Pt bib ems from home after pt fell and hit head. Pt was bending down to pick up a coin and when she stood up she fell backwards striking her head. Lac to back of head. Bleeding controlled. Pt in NAD. VSS. GCS 15.

## 2020-09-13 NOTE — ED Provider Notes (Signed)
[facility] Provider Note   CSN: [id number] Arrival date & time: 09/13/20  1608     History Chief Complaint  Patient presents with  . Fall/ Level 2    Antanasia [name] is a 86 y.o. female.  The history is provided by the patient and the EMS personnel.  Fall This is a new problem. The current episode started less than 1 hour ago. The problem has not changed since onset.Pertinent negatives include no chest pain, no abdominal pain, no headaches and no shortness of breath. The symptoms are aggravated by bending. The symptoms are relieved by rest. She has tried nothing for the symptoms.  Head Injury Associated symptoms: no headaches, no loss of consciousness, no nausea, no neck pain and no vomiting   Trauma Mechanism of injury: fall Injury location: head/neck Injury location detail: head Incident location: home Arrived directly from scene: yes   Fall:      Fall occurred: standing      Impact surface: hard floor      Point of impact: head  Protective equipment:       None  EMS/PTA data:      Responsiveness: alert      Oriented to: person, place and situation      Loss of consciousness: no      Amnesic to event: no  Current symptoms:      Associated symptoms:            Denies abdominal pain, back pain, chest pain, headache, loss of consciousness, nausea, neck pain and vomiting.       Past Medical History:  Diagnosis Date  . Hypertension     There are no problems to display for this patient.   History reviewed. No pertinent surgical history.   OB History   No obstetric history on file.     History reviewed. No pertinent family history.  Social History   Tobacco Use  . Smoking status: Not on file  Substance Use Topics  . Alcohol use: Not on file  . Drug use: Not on file    Home Medications Prior to Admission medications   Medication Sig Start Date End Date Taking? Authorizing Provider  albuterol (VENTOLIN HFA)  108 (90 Base) MCG/ACT inhaler Inhale 1 puff into the lungs daily. 08/18/20  Yes [provider]  brimonidine (ALPHAGAN) 0.2 % ophthalmic solution Place 1 drop into the left eye 2 (two) times daily. 07/29/20  Yes [provider]  buPROPion (WELLBUTRIN XL) 150 MG 24 hr tablet Take 150 mg by mouth daily.  05/06/20  Yes [provider]  ELIQUIS 5 MG TABS tablet Take 5 mg by mouth 2 (two) times daily. 08/18/20  Yes [provider]  latanoprost (XALATAN) 0.005 % ophthalmic solution Place 1 drop into the left eye at bedtime. 09/13/20  Yes [provider]  alendronate (FOSAMAX) 70 MG tablet Take 70 mg by mouth once a week. 09/10/20   [provider]    Allergies    Sulfa antibiotics  Review of Systems   Review of Systems  Constitutional: Negative for chills and fever.  HENT: Negative for congestion and sore throat.   Respiratory: Negative for cough and shortness of breath.   Cardiovascular: Positive for leg swelling. Negative for chest pain.  Gastrointestinal: Negative for abdominal pain, nausea and vomiting.  Musculoskeletal: Negative for back pain and neck pain.  Neurological: Negative for dizziness, loss of consciousness, weakness, light-headedness and headaches.  All other systems reviewed  and are negative.   Physical Exam Updated Vital Signs BP (!) 172/63   Pulse 72   Temp (!) 97.3 F (36.3 C) (Temporal)   Resp (!) 26   Ht 5\' 5"  (1.651 m)   Wt 81.6 kg   SpO2 97%   BMI 29.95 kg/m   Physical Exam Vitals reviewed.  Constitutional:      General: She is not in acute distress.    Appearance: Normal appearance.  HENT:     Head:     Comments: Posterior scalp lac    Nose: Nose normal.     Mouth/Throat:     Mouth: Mucous membranes are moist.     Pharynx: Oropharynx is clear.  Eyes:     Conjunctiva/sclera: Conjunctivae normal.  Cardiovascular:     Heart sounds: Normal heart sounds.  Pulmonary:     Effort: Pulmonary effort is  normal. No respiratory distress.     Breath sounds: Normal breath sounds.  Abdominal:     General: Abdomen is flat.     Palpations: Abdomen is soft.     Tenderness: There is no abdominal tenderness.  Musculoskeletal:     Cervical back: Normal range of motion and neck supple. No tenderness.  Skin:    General: Skin is warm.     Capillary Refill: Capillary refill takes less than 2 seconds.     Findings: Erythema present.     Comments: LLE redness and warmth   2 cm laceration on posterior scalp  Neurological:     Mental Status: She is alert and oriented to person, place, and time.  Psychiatric:        Mood and Affect: Mood normal.        Behavior: Behavior normal.     ED Results / Procedures / Treatments   Labs (all labs ordered are listed, but only abnormal results are displayed) Labs Reviewed  CBC WITH DIFFERENTIAL/PLATELET - Abnormal; Notable for the following components:      Result Value   RBC 3.74 (*)    Hemoglobin 10.6 (*)    HCT 35.1 (*)    All other components within normal limits  BASIC METABOLIC PANEL - Abnormal; Notable for the following components:   CO2 21 (*)    Glucose, Bld 113 (*)    GFR, Estimated 55 (*)    All other components within normal limits  MAGNESIUM    EKG EKG Interpretation  Date/Time:  Saturday September 13 2020 16:17:47 EDT Ventricular Rate:  72 PR Interval:    QRS Duration: 98 QT Interval:  390 QTC Calculation: 427 R Axis:   59 Text Interpretation: Sinus rhythm no prior ECG for comparison. No STEMI Confirmed by Antony Blackbird [phone]) on 09/13/2020 4:29:33 PM   Radiology CT Head Wo Contrast  Result Date: 09/13/2020 CLINICAL DATA:  Head injury after fall.  No loss of consciousness. EXAM: CT HEAD WITHOUT CONTRAST CT CERVICAL SPINE WITHOUT CONTRAST TECHNIQUE: Multidetector CT imaging of the head and cervical spine was performed following the standard protocol without intravenous contrast. Multiplanar CT image reconstructions of the  cervical spine were also generated. COMPARISON:  None. FINDINGS: CT HEAD FINDINGS Brain: Mild chronic ischemic white matter disease is noted. No mass effect or midline shift is noted. Ventricular size is within normal limits. There is no evidence of mass lesion, hemorrhage or acute infarction. Vascular: No hyperdense vessel or unexpected calcification. Skull: Normal. Negative for fracture or focal lesion. Sinuses/Orbits: No acute finding. Other: None. CT CERVICAL SPINE FINDINGS Alignment: Normal.  Skull base and vertebrae: No acute fracture. No primary bone lesion or focal pathologic process. Soft tissues and spinal canal: No prevertebral fluid or swelling. No visible canal hematoma. Disc levels: Moderate degenerative disc disease is noted at C5-6 and C6-7 with anterior posterior osteophyte formation. Upper chest: Right pleural effusion is noted. Other: Degenerative changes are seen involving posterior facet joints bilaterally. IMPRESSION: 1. Mild chronic ischemic white matter disease. No acute intracranial abnormality seen. 2. Multilevel degenerative disc disease. No acute abnormality seen in the cervical spine. 3. Right pleural effusion is noted. Electronically Signed   By: Marijo Conception M.D.   On: 09/13/2020 16:48   CT Cervical Spine Wo Contrast  Result Date: 09/13/2020 CLINICAL DATA:  Head injury after fall.  No loss of consciousness. EXAM: CT HEAD WITHOUT CONTRAST CT CERVICAL SPINE WITHOUT CONTRAST TECHNIQUE: Multidetector CT imaging of the head and cervical spine was performed following the standard protocol without intravenous contrast. Multiplanar CT image reconstructions of the cervical spine were also generated. COMPARISON:  None. FINDINGS: CT HEAD FINDINGS Brain: Mild chronic ischemic white matter disease is noted. No mass effect or midline shift is noted. Ventricular size is within normal limits. There is no evidence of mass lesion, hemorrhage or acute infarction. Vascular: No hyperdense vessel or  unexpected calcification. Skull: Normal. Negative for fracture or focal lesion. Sinuses/Orbits: No acute finding. Other: None. CT CERVICAL SPINE FINDINGS Alignment: Normal. Skull base and vertebrae: No acute fracture. No primary bone lesion or focal pathologic process. Soft tissues and spinal canal: No prevertebral fluid or swelling. No visible canal hematoma. Disc levels: Moderate degenerative disc disease is noted at C5-6 and C6-7 with anterior posterior osteophyte formation. Upper chest: Right pleural effusion is noted. Other: Degenerative changes are seen involving posterior facet joints bilaterally. IMPRESSION: 1. Mild chronic ischemic white matter disease. No acute intracranial abnormality seen. 2. Multilevel degenerative disc disease. No acute abnormality seen in the cervical spine. 3. Right pleural effusion is noted. Electronically Signed   By: Marijo Conception M.D.   On: 09/13/2020 16:48    Procedures .Marland KitchenLaceration Repair  Date/Time: 09/13/2020 6:11 PM Performed by: Roosevelt Locks, MD Authorized by: Courtney Paris, MD   Consent:    Consent obtained:  Verbal   Consent given by:  Patient Anesthesia (see MAR for exact dosages):    Anesthesia method:  None Laceration details:    Location:  Scalp   Scalp location:  Crown   Length (cm):  3 Repair type:    Repair type:  Simple Exploration:    Wound exploration: entire depth of wound probed and visualized     Contaminated: no   Treatment:    Amount of cleaning:  Standard   Irrigation method:  Syringe Skin repair:    Repair method:  Staples   Number of staples:  4 Approximation:    Approximation:  Close Post-procedure details:    Dressing:  Open (no dressing)   Patient tolerance of procedure:  Tolerated well, no immediate complications   (including critical care time)  Medications Ordered in ED Medications - No data to display  ED Course  I have reviewed the triage vital signs and the nursing notes.  Pertinent labs &  imaging results that were available during my care of the patient were reviewed by me and considered in my medical decision making (see chart for details).    MDM Rules/Calculators/A&P  Medical Decision Making: Jaime [name] is a 87 y.o. female who presented to the ED today with fall. Pt reports she was leaning over to pick up a coin off the ground and when she stood up she became light headed and fell backwards. Pt reports no LOC but his the back of her head. Pt is on anticoagulation, reports she was started on eliquis recently (08/18/20) after she had a PE and DVT in the setting of COVID. Pt denies SOB or CP today. Reports no HA, neck pain, vision changes, or any pain after the fall. Pt has been felling well in her normal state of health, pt reports she is taking her eliquis.  Past medical history significant for HTN, PE/DVT on eliquis  Reviewed and confirmed nursing documentation for past medical history, family history, social history.  On my initial exam, the pt was in NAD, A&Ox3, hemodynamically stable, 3 cm lac on posterior scalp, erythema and warm on LLE.    All radiology and laboratory studies reviewed independently and with my attending physician, agree with reading provided by radiologist unless otherwise noted.  Upon reassessing patient, patient was comfortably resting on bed, daughter at bedside.  Head lac closed as discussed above.  CT head and c spine negative.   Daughter at bedside reports the redness on LLE is improving compared to prior, no leukocytosis or fever today,  advised to follow up with PCP in 2-5 days for evaluation of suspected resolving cellulitis.  Pt CT shows R pleural effusion, recommend fu with PCP. Pt has no respiratory complaints today, normal WOB and sats, clear lung exam.   Based on the above findings, I believe patient is hemodynamically stable for discharge  Patient/and family educated about specific return precautions for  given chief complaint and symptoms.  Patient/and family educated about follow-up with PCP  for re-eval of lungs (pleural effusion on CT) and LLE redness.  Patient/and family expressed understanding of return precautions and need for follow-up.  Patient discharged.   The above care was discussed with and agreed upon by my attending physician. Emergency Department Medication Summary:  Medications - No data to display      Final Clinical Impression(s) / ED Diagnoses Final diagnoses:  Fall, initial encounter    Rx / DC Orders ED Discharge Orders    None       Roosevelt Locks, MD 09/13/20 2019    Tegeler, Gwenyth Allegra, MD 09/15/20 [phone]

## 2020-09-15 ENCOUNTER — Encounter (HOSPITAL_COMMUNITY): Payer: Self-pay | Admitting: Internal Medicine

## 2020-09-15 DIAGNOSIS — K5900 Constipation, unspecified: Secondary | ICD-10-CM | POA: Diagnosis not present

## 2020-09-15 DIAGNOSIS — I119 Hypertensive heart disease without heart failure: Secondary | ICD-10-CM | POA: Diagnosis not present

## 2020-09-15 DIAGNOSIS — I2602 Saddle embolus of pulmonary artery with acute cor pulmonale: Secondary | ICD-10-CM | POA: Diagnosis not present

## 2020-09-15 DIAGNOSIS — M6259 Muscle wasting and atrophy, not elsewhere classified, multiple sites: Secondary | ICD-10-CM | POA: Diagnosis not present

## 2020-09-15 DIAGNOSIS — I82402 Acute embolism and thrombosis of unspecified deep veins of left lower extremity: Secondary | ICD-10-CM | POA: Diagnosis not present

## 2020-09-15 DIAGNOSIS — M810 Age-related osteoporosis without current pathological fracture: Secondary | ICD-10-CM | POA: Diagnosis not present

## 2020-09-15 DIAGNOSIS — J309 Allergic rhinitis, unspecified: Secondary | ICD-10-CM | POA: Diagnosis not present

## 2020-09-15 DIAGNOSIS — M1991 Primary osteoarthritis, unspecified site: Secondary | ICD-10-CM | POA: Diagnosis not present

## 2020-09-15 DIAGNOSIS — J9601 Acute respiratory failure with hypoxia: Secondary | ICD-10-CM | POA: Diagnosis not present

## 2020-09-15 DIAGNOSIS — K219 Gastro-esophageal reflux disease without esophagitis: Secondary | ICD-10-CM | POA: Diagnosis not present

## 2020-09-15 DIAGNOSIS — R911 Solitary pulmonary nodule: Secondary | ICD-10-CM | POA: Diagnosis not present

## 2020-09-15 DIAGNOSIS — H4010X0 Unspecified open-angle glaucoma, stage unspecified: Secondary | ICD-10-CM | POA: Diagnosis not present

## 2020-09-15 DIAGNOSIS — E782 Mixed hyperlipidemia: Secondary | ICD-10-CM | POA: Diagnosis not present

## 2020-09-15 DIAGNOSIS — D62 Acute posthemorrhagic anemia: Secondary | ICD-10-CM | POA: Diagnosis not present

## 2020-09-15 DIAGNOSIS — U071 COVID-19: Secondary | ICD-10-CM | POA: Diagnosis not present

## 2020-09-15 DIAGNOSIS — Z7901 Long term (current) use of anticoagulants: Secondary | ICD-10-CM | POA: Diagnosis not present

## 2020-09-16 DIAGNOSIS — K5900 Constipation, unspecified: Secondary | ICD-10-CM | POA: Diagnosis not present

## 2020-09-16 DIAGNOSIS — R911 Solitary pulmonary nodule: Secondary | ICD-10-CM | POA: Diagnosis not present

## 2020-09-16 DIAGNOSIS — I2602 Saddle embolus of pulmonary artery with acute cor pulmonale: Secondary | ICD-10-CM | POA: Diagnosis not present

## 2020-09-16 DIAGNOSIS — M6259 Muscle wasting and atrophy, not elsewhere classified, multiple sites: Secondary | ICD-10-CM | POA: Diagnosis not present

## 2020-09-16 DIAGNOSIS — U071 COVID-19: Secondary | ICD-10-CM | POA: Diagnosis not present

## 2020-09-16 DIAGNOSIS — H4010X0 Unspecified open-angle glaucoma, stage unspecified: Secondary | ICD-10-CM | POA: Diagnosis not present

## 2020-09-16 DIAGNOSIS — J309 Allergic rhinitis, unspecified: Secondary | ICD-10-CM | POA: Diagnosis not present

## 2020-09-16 DIAGNOSIS — Z7901 Long term (current) use of anticoagulants: Secondary | ICD-10-CM | POA: Diagnosis not present

## 2020-09-16 DIAGNOSIS — I119 Hypertensive heart disease without heart failure: Secondary | ICD-10-CM | POA: Diagnosis not present

## 2020-09-16 DIAGNOSIS — K219 Gastro-esophageal reflux disease without esophagitis: Secondary | ICD-10-CM | POA: Diagnosis not present

## 2020-09-16 DIAGNOSIS — I82402 Acute embolism and thrombosis of unspecified deep veins of left lower extremity: Secondary | ICD-10-CM | POA: Diagnosis not present

## 2020-09-16 DIAGNOSIS — M1991 Primary osteoarthritis, unspecified site: Secondary | ICD-10-CM | POA: Diagnosis not present

## 2020-09-16 DIAGNOSIS — D62 Acute posthemorrhagic anemia: Secondary | ICD-10-CM | POA: Diagnosis not present

## 2020-09-16 DIAGNOSIS — M810 Age-related osteoporosis without current pathological fracture: Secondary | ICD-10-CM | POA: Diagnosis not present

## 2020-09-16 DIAGNOSIS — E782 Mixed hyperlipidemia: Secondary | ICD-10-CM | POA: Diagnosis not present

## 2020-09-16 DIAGNOSIS — J9601 Acute respiratory failure with hypoxia: Secondary | ICD-10-CM | POA: Diagnosis not present

## 2020-09-17 DIAGNOSIS — E782 Mixed hyperlipidemia: Secondary | ICD-10-CM | POA: Diagnosis not present

## 2020-09-17 DIAGNOSIS — J9601 Acute respiratory failure with hypoxia: Secondary | ICD-10-CM | POA: Diagnosis not present

## 2020-09-17 DIAGNOSIS — Z7901 Long term (current) use of anticoagulants: Secondary | ICD-10-CM | POA: Diagnosis not present

## 2020-09-17 DIAGNOSIS — K5900 Constipation, unspecified: Secondary | ICD-10-CM | POA: Diagnosis not present

## 2020-09-17 DIAGNOSIS — K219 Gastro-esophageal reflux disease without esophagitis: Secondary | ICD-10-CM | POA: Diagnosis not present

## 2020-09-17 DIAGNOSIS — I82402 Acute embolism and thrombosis of unspecified deep veins of left lower extremity: Secondary | ICD-10-CM | POA: Diagnosis not present

## 2020-09-17 DIAGNOSIS — U071 COVID-19: Secondary | ICD-10-CM | POA: Diagnosis not present

## 2020-09-17 DIAGNOSIS — M6259 Muscle wasting and atrophy, not elsewhere classified, multiple sites: Secondary | ICD-10-CM | POA: Diagnosis not present

## 2020-09-17 DIAGNOSIS — M810 Age-related osteoporosis without current pathological fracture: Secondary | ICD-10-CM | POA: Diagnosis not present

## 2020-09-17 DIAGNOSIS — D62 Acute posthemorrhagic anemia: Secondary | ICD-10-CM | POA: Diagnosis not present

## 2020-09-17 DIAGNOSIS — R911 Solitary pulmonary nodule: Secondary | ICD-10-CM | POA: Diagnosis not present

## 2020-09-17 DIAGNOSIS — M1991 Primary osteoarthritis, unspecified site: Secondary | ICD-10-CM | POA: Diagnosis not present

## 2020-09-17 DIAGNOSIS — I2602 Saddle embolus of pulmonary artery with acute cor pulmonale: Secondary | ICD-10-CM | POA: Diagnosis not present

## 2020-09-17 DIAGNOSIS — J309 Allergic rhinitis, unspecified: Secondary | ICD-10-CM | POA: Diagnosis not present

## 2020-09-17 DIAGNOSIS — I119 Hypertensive heart disease without heart failure: Secondary | ICD-10-CM | POA: Diagnosis not present

## 2020-09-17 DIAGNOSIS — H4010X0 Unspecified open-angle glaucoma, stage unspecified: Secondary | ICD-10-CM | POA: Diagnosis not present

## 2020-09-22 DIAGNOSIS — S0101XA Laceration without foreign body of scalp, initial encounter: Secondary | ICD-10-CM | POA: Diagnosis not present

## 2020-09-22 DIAGNOSIS — I82402 Acute embolism and thrombosis of unspecified deep veins of left lower extremity: Secondary | ICD-10-CM | POA: Diagnosis not present

## 2020-09-22 DIAGNOSIS — J9601 Acute respiratory failure with hypoxia: Secondary | ICD-10-CM | POA: Diagnosis not present

## 2020-09-22 DIAGNOSIS — I2602 Saddle embolus of pulmonary artery with acute cor pulmonale: Secondary | ICD-10-CM | POA: Diagnosis not present

## 2020-09-22 DIAGNOSIS — M810 Age-related osteoporosis without current pathological fracture: Secondary | ICD-10-CM | POA: Diagnosis not present

## 2020-09-22 DIAGNOSIS — M6259 Muscle wasting and atrophy, not elsewhere classified, multiple sites: Secondary | ICD-10-CM | POA: Diagnosis not present

## 2020-09-22 DIAGNOSIS — M1991 Primary osteoarthritis, unspecified site: Secondary | ICD-10-CM | POA: Diagnosis not present

## 2020-09-22 DIAGNOSIS — K219 Gastro-esophageal reflux disease without esophagitis: Secondary | ICD-10-CM | POA: Diagnosis not present

## 2020-09-22 DIAGNOSIS — R911 Solitary pulmonary nodule: Secondary | ICD-10-CM | POA: Diagnosis not present

## 2020-09-22 DIAGNOSIS — I119 Hypertensive heart disease without heart failure: Secondary | ICD-10-CM | POA: Diagnosis not present

## 2020-09-22 DIAGNOSIS — Z4802 Encounter for removal of sutures: Secondary | ICD-10-CM | POA: Diagnosis not present

## 2020-09-22 DIAGNOSIS — E782 Mixed hyperlipidemia: Secondary | ICD-10-CM | POA: Diagnosis not present

## 2020-09-22 DIAGNOSIS — K5900 Constipation, unspecified: Secondary | ICD-10-CM | POA: Diagnosis not present

## 2020-09-22 DIAGNOSIS — Z7901 Long term (current) use of anticoagulants: Secondary | ICD-10-CM | POA: Diagnosis not present

## 2020-09-22 DIAGNOSIS — U071 COVID-19: Secondary | ICD-10-CM | POA: Diagnosis not present

## 2020-09-22 DIAGNOSIS — D62 Acute posthemorrhagic anemia: Secondary | ICD-10-CM | POA: Diagnosis not present

## 2020-09-22 DIAGNOSIS — H4010X0 Unspecified open-angle glaucoma, stage unspecified: Secondary | ICD-10-CM | POA: Diagnosis not present

## 2020-09-22 DIAGNOSIS — W1830XA Fall on same level, unspecified, initial encounter: Secondary | ICD-10-CM | POA: Diagnosis not present

## 2020-09-22 DIAGNOSIS — J309 Allergic rhinitis, unspecified: Secondary | ICD-10-CM | POA: Diagnosis not present

## 2020-09-23 DIAGNOSIS — I119 Hypertensive heart disease without heart failure: Secondary | ICD-10-CM | POA: Diagnosis not present

## 2020-09-23 DIAGNOSIS — H4010X0 Unspecified open-angle glaucoma, stage unspecified: Secondary | ICD-10-CM | POA: Diagnosis not present

## 2020-09-23 DIAGNOSIS — M1991 Primary osteoarthritis, unspecified site: Secondary | ICD-10-CM | POA: Diagnosis not present

## 2020-09-23 DIAGNOSIS — M810 Age-related osteoporosis without current pathological fracture: Secondary | ICD-10-CM | POA: Diagnosis not present

## 2020-09-23 DIAGNOSIS — K219 Gastro-esophageal reflux disease without esophagitis: Secondary | ICD-10-CM | POA: Diagnosis not present

## 2020-09-23 DIAGNOSIS — D62 Acute posthemorrhagic anemia: Secondary | ICD-10-CM | POA: Diagnosis not present

## 2020-09-23 DIAGNOSIS — K5900 Constipation, unspecified: Secondary | ICD-10-CM | POA: Diagnosis not present

## 2020-09-23 DIAGNOSIS — I82402 Acute embolism and thrombosis of unspecified deep veins of left lower extremity: Secondary | ICD-10-CM | POA: Diagnosis not present

## 2020-09-23 DIAGNOSIS — E782 Mixed hyperlipidemia: Secondary | ICD-10-CM | POA: Diagnosis not present

## 2020-09-23 DIAGNOSIS — J9601 Acute respiratory failure with hypoxia: Secondary | ICD-10-CM | POA: Diagnosis not present

## 2020-09-23 DIAGNOSIS — J309 Allergic rhinitis, unspecified: Secondary | ICD-10-CM | POA: Diagnosis not present

## 2020-09-23 DIAGNOSIS — M6259 Muscle wasting and atrophy, not elsewhere classified, multiple sites: Secondary | ICD-10-CM | POA: Diagnosis not present

## 2020-09-23 DIAGNOSIS — R911 Solitary pulmonary nodule: Secondary | ICD-10-CM | POA: Diagnosis not present

## 2020-09-23 DIAGNOSIS — Z7901 Long term (current) use of anticoagulants: Secondary | ICD-10-CM | POA: Diagnosis not present

## 2020-09-23 DIAGNOSIS — U071 COVID-19: Secondary | ICD-10-CM | POA: Diagnosis not present

## 2020-09-23 DIAGNOSIS — I2602 Saddle embolus of pulmonary artery with acute cor pulmonale: Secondary | ICD-10-CM | POA: Diagnosis not present

## 2020-09-24 DIAGNOSIS — U071 COVID-19: Secondary | ICD-10-CM | POA: Diagnosis not present

## 2020-09-24 DIAGNOSIS — K219 Gastro-esophageal reflux disease without esophagitis: Secondary | ICD-10-CM | POA: Diagnosis not present

## 2020-09-24 DIAGNOSIS — H4010X0 Unspecified open-angle glaucoma, stage unspecified: Secondary | ICD-10-CM | POA: Diagnosis not present

## 2020-09-24 DIAGNOSIS — I2602 Saddle embolus of pulmonary artery with acute cor pulmonale: Secondary | ICD-10-CM | POA: Diagnosis not present

## 2020-09-24 DIAGNOSIS — K5900 Constipation, unspecified: Secondary | ICD-10-CM | POA: Diagnosis not present

## 2020-09-24 DIAGNOSIS — M6259 Muscle wasting and atrophy, not elsewhere classified, multiple sites: Secondary | ICD-10-CM | POA: Diagnosis not present

## 2020-09-24 DIAGNOSIS — R911 Solitary pulmonary nodule: Secondary | ICD-10-CM | POA: Diagnosis not present

## 2020-09-24 DIAGNOSIS — I119 Hypertensive heart disease without heart failure: Secondary | ICD-10-CM | POA: Diagnosis not present

## 2020-09-24 DIAGNOSIS — Z7901 Long term (current) use of anticoagulants: Secondary | ICD-10-CM | POA: Diagnosis not present

## 2020-09-24 DIAGNOSIS — M1991 Primary osteoarthritis, unspecified site: Secondary | ICD-10-CM | POA: Diagnosis not present

## 2020-09-24 DIAGNOSIS — J309 Allergic rhinitis, unspecified: Secondary | ICD-10-CM | POA: Diagnosis not present

## 2020-09-24 DIAGNOSIS — I82402 Acute embolism and thrombosis of unspecified deep veins of left lower extremity: Secondary | ICD-10-CM | POA: Diagnosis not present

## 2020-09-24 DIAGNOSIS — E782 Mixed hyperlipidemia: Secondary | ICD-10-CM | POA: Diagnosis not present

## 2020-09-24 DIAGNOSIS — D62 Acute posthemorrhagic anemia: Secondary | ICD-10-CM | POA: Diagnosis not present

## 2020-09-24 DIAGNOSIS — J9601 Acute respiratory failure with hypoxia: Secondary | ICD-10-CM | POA: Diagnosis not present

## 2020-09-24 DIAGNOSIS — M810 Age-related osteoporosis without current pathological fracture: Secondary | ICD-10-CM | POA: Diagnosis not present

## 2020-09-27 DIAGNOSIS — R911 Solitary pulmonary nodule: Secondary | ICD-10-CM | POA: Diagnosis not present

## 2020-09-27 DIAGNOSIS — Z96651 Presence of right artificial knee joint: Secondary | ICD-10-CM | POA: Diagnosis not present

## 2020-09-27 DIAGNOSIS — M810 Age-related osteoporosis without current pathological fracture: Secondary | ICD-10-CM | POA: Diagnosis not present

## 2020-09-27 DIAGNOSIS — M1991 Primary osteoarthritis, unspecified site: Secondary | ICD-10-CM | POA: Diagnosis not present

## 2020-09-27 DIAGNOSIS — J309 Allergic rhinitis, unspecified: Secondary | ICD-10-CM | POA: Diagnosis not present

## 2020-09-27 DIAGNOSIS — J9601 Acute respiratory failure with hypoxia: Secondary | ICD-10-CM | POA: Diagnosis not present

## 2020-09-27 DIAGNOSIS — E782 Mixed hyperlipidemia: Secondary | ICD-10-CM | POA: Diagnosis not present

## 2020-09-27 DIAGNOSIS — I2609 Other pulmonary embolism with acute cor pulmonale: Secondary | ICD-10-CM | POA: Diagnosis not present

## 2020-09-27 DIAGNOSIS — Z7901 Long term (current) use of anticoagulants: Secondary | ICD-10-CM | POA: Diagnosis not present

## 2020-09-27 DIAGNOSIS — I82402 Acute embolism and thrombosis of unspecified deep veins of left lower extremity: Secondary | ICD-10-CM | POA: Diagnosis not present

## 2020-09-27 DIAGNOSIS — K219 Gastro-esophageal reflux disease without esophagitis: Secondary | ICD-10-CM | POA: Diagnosis not present

## 2020-09-27 DIAGNOSIS — Z9181 History of falling: Secondary | ICD-10-CM | POA: Diagnosis not present

## 2020-09-27 DIAGNOSIS — U071 COVID-19: Secondary | ICD-10-CM | POA: Diagnosis not present

## 2020-09-27 DIAGNOSIS — I2602 Saddle embolus of pulmonary artery with acute cor pulmonale: Secondary | ICD-10-CM | POA: Diagnosis not present

## 2020-09-27 DIAGNOSIS — H4010X0 Unspecified open-angle glaucoma, stage unspecified: Secondary | ICD-10-CM | POA: Diagnosis not present

## 2020-09-27 DIAGNOSIS — K5900 Constipation, unspecified: Secondary | ICD-10-CM | POA: Diagnosis not present

## 2020-09-27 DIAGNOSIS — D62 Acute posthemorrhagic anemia: Secondary | ICD-10-CM | POA: Diagnosis not present

## 2020-09-27 DIAGNOSIS — M6259 Muscle wasting and atrophy, not elsewhere classified, multiple sites: Secondary | ICD-10-CM | POA: Diagnosis not present

## 2020-09-27 DIAGNOSIS — I119 Hypertensive heart disease without heart failure: Secondary | ICD-10-CM | POA: Diagnosis not present

## 2020-09-29 DIAGNOSIS — U071 COVID-19: Secondary | ICD-10-CM | POA: Diagnosis not present

## 2020-09-29 DIAGNOSIS — J9601 Acute respiratory failure with hypoxia: Secondary | ICD-10-CM | POA: Diagnosis not present

## 2020-09-29 DIAGNOSIS — I2602 Saddle embolus of pulmonary artery with acute cor pulmonale: Secondary | ICD-10-CM | POA: Diagnosis not present

## 2020-09-29 DIAGNOSIS — D62 Acute posthemorrhagic anemia: Secondary | ICD-10-CM | POA: Diagnosis not present

## 2020-09-29 DIAGNOSIS — M6259 Muscle wasting and atrophy, not elsewhere classified, multiple sites: Secondary | ICD-10-CM | POA: Diagnosis not present

## 2020-09-29 DIAGNOSIS — M810 Age-related osteoporosis without current pathological fracture: Secondary | ICD-10-CM | POA: Diagnosis not present

## 2020-10-03 DIAGNOSIS — M6259 Muscle wasting and atrophy, not elsewhere classified, multiple sites: Secondary | ICD-10-CM | POA: Diagnosis not present

## 2020-10-03 DIAGNOSIS — U071 COVID-19: Secondary | ICD-10-CM | POA: Diagnosis not present

## 2020-10-03 DIAGNOSIS — J9601 Acute respiratory failure with hypoxia: Secondary | ICD-10-CM | POA: Diagnosis not present

## 2020-10-03 DIAGNOSIS — I2602 Saddle embolus of pulmonary artery with acute cor pulmonale: Secondary | ICD-10-CM | POA: Diagnosis not present

## 2020-10-03 DIAGNOSIS — M810 Age-related osteoporosis without current pathological fracture: Secondary | ICD-10-CM | POA: Diagnosis not present

## 2020-10-03 DIAGNOSIS — D62 Acute posthemorrhagic anemia: Secondary | ICD-10-CM | POA: Diagnosis not present

## 2020-10-06 DIAGNOSIS — M810 Age-related osteoporosis without current pathological fracture: Secondary | ICD-10-CM | POA: Diagnosis not present

## 2020-10-06 DIAGNOSIS — U071 COVID-19: Secondary | ICD-10-CM | POA: Diagnosis not present

## 2020-10-06 DIAGNOSIS — J9601 Acute respiratory failure with hypoxia: Secondary | ICD-10-CM | POA: Diagnosis not present

## 2020-10-06 DIAGNOSIS — D62 Acute posthemorrhagic anemia: Secondary | ICD-10-CM | POA: Diagnosis not present

## 2020-10-06 DIAGNOSIS — I2602 Saddle embolus of pulmonary artery with acute cor pulmonale: Secondary | ICD-10-CM | POA: Diagnosis not present

## 2020-10-06 DIAGNOSIS — M6259 Muscle wasting and atrophy, not elsewhere classified, multiple sites: Secondary | ICD-10-CM | POA: Diagnosis not present

## 2020-10-08 DIAGNOSIS — M6259 Muscle wasting and atrophy, not elsewhere classified, multiple sites: Secondary | ICD-10-CM | POA: Diagnosis not present

## 2020-10-08 DIAGNOSIS — J9601 Acute respiratory failure with hypoxia: Secondary | ICD-10-CM | POA: Diagnosis not present

## 2020-10-08 DIAGNOSIS — U071 COVID-19: Secondary | ICD-10-CM | POA: Diagnosis not present

## 2020-10-08 DIAGNOSIS — M810 Age-related osteoporosis without current pathological fracture: Secondary | ICD-10-CM | POA: Diagnosis not present

## 2020-10-08 DIAGNOSIS — I2602 Saddle embolus of pulmonary artery with acute cor pulmonale: Secondary | ICD-10-CM | POA: Diagnosis not present

## 2020-10-08 DIAGNOSIS — D62 Acute posthemorrhagic anemia: Secondary | ICD-10-CM | POA: Diagnosis not present

## 2020-10-13 DIAGNOSIS — M6259 Muscle wasting and atrophy, not elsewhere classified, multiple sites: Secondary | ICD-10-CM | POA: Diagnosis not present

## 2020-10-13 DIAGNOSIS — D62 Acute posthemorrhagic anemia: Secondary | ICD-10-CM | POA: Diagnosis not present

## 2020-10-13 DIAGNOSIS — M810 Age-related osteoporosis without current pathological fracture: Secondary | ICD-10-CM | POA: Diagnosis not present

## 2020-10-13 DIAGNOSIS — I2602 Saddle embolus of pulmonary artery with acute cor pulmonale: Secondary | ICD-10-CM | POA: Diagnosis not present

## 2020-10-13 DIAGNOSIS — J9601 Acute respiratory failure with hypoxia: Secondary | ICD-10-CM | POA: Diagnosis not present

## 2020-10-13 DIAGNOSIS — U071 COVID-19: Secondary | ICD-10-CM | POA: Diagnosis not present

## 2020-10-15 DIAGNOSIS — U071 COVID-19: Secondary | ICD-10-CM | POA: Diagnosis not present

## 2020-10-15 DIAGNOSIS — I2602 Saddle embolus of pulmonary artery with acute cor pulmonale: Secondary | ICD-10-CM | POA: Diagnosis not present

## 2020-10-15 DIAGNOSIS — M810 Age-related osteoporosis without current pathological fracture: Secondary | ICD-10-CM | POA: Diagnosis not present

## 2020-10-15 DIAGNOSIS — M6259 Muscle wasting and atrophy, not elsewhere classified, multiple sites: Secondary | ICD-10-CM | POA: Diagnosis not present

## 2020-10-15 DIAGNOSIS — D62 Acute posthemorrhagic anemia: Secondary | ICD-10-CM | POA: Diagnosis not present

## 2020-10-15 DIAGNOSIS — J9601 Acute respiratory failure with hypoxia: Secondary | ICD-10-CM | POA: Diagnosis not present

## 2020-10-22 DIAGNOSIS — J9601 Acute respiratory failure with hypoxia: Secondary | ICD-10-CM | POA: Diagnosis not present

## 2020-10-22 DIAGNOSIS — D62 Acute posthemorrhagic anemia: Secondary | ICD-10-CM | POA: Diagnosis not present

## 2020-10-22 DIAGNOSIS — M810 Age-related osteoporosis without current pathological fracture: Secondary | ICD-10-CM | POA: Diagnosis not present

## 2020-10-22 DIAGNOSIS — U071 COVID-19: Secondary | ICD-10-CM | POA: Diagnosis not present

## 2020-10-22 DIAGNOSIS — M6259 Muscle wasting and atrophy, not elsewhere classified, multiple sites: Secondary | ICD-10-CM | POA: Diagnosis not present

## 2020-10-22 DIAGNOSIS — I2602 Saddle embolus of pulmonary artery with acute cor pulmonale: Secondary | ICD-10-CM | POA: Diagnosis not present

## 2020-10-23 DIAGNOSIS — I2602 Saddle embolus of pulmonary artery with acute cor pulmonale: Secondary | ICD-10-CM | POA: Diagnosis not present

## 2020-10-23 DIAGNOSIS — U071 COVID-19: Secondary | ICD-10-CM | POA: Diagnosis not present

## 2020-10-23 DIAGNOSIS — J9601 Acute respiratory failure with hypoxia: Secondary | ICD-10-CM | POA: Diagnosis not present

## 2020-10-23 DIAGNOSIS — D62 Acute posthemorrhagic anemia: Secondary | ICD-10-CM | POA: Diagnosis not present

## 2020-10-23 DIAGNOSIS — M6259 Muscle wasting and atrophy, not elsewhere classified, multiple sites: Secondary | ICD-10-CM | POA: Diagnosis not present

## 2020-10-23 DIAGNOSIS — M810 Age-related osteoporosis without current pathological fracture: Secondary | ICD-10-CM | POA: Diagnosis not present

## 2020-10-27 DIAGNOSIS — I119 Hypertensive heart disease without heart failure: Secondary | ICD-10-CM | POA: Diagnosis not present

## 2020-10-27 DIAGNOSIS — R911 Solitary pulmonary nodule: Secondary | ICD-10-CM | POA: Diagnosis not present

## 2020-10-27 DIAGNOSIS — Z9181 History of falling: Secondary | ICD-10-CM | POA: Diagnosis not present

## 2020-10-27 DIAGNOSIS — M6259 Muscle wasting and atrophy, not elsewhere classified, multiple sites: Secondary | ICD-10-CM | POA: Diagnosis not present

## 2020-10-27 DIAGNOSIS — Z8616 Personal history of COVID-19: Secondary | ICD-10-CM | POA: Diagnosis not present

## 2020-10-27 DIAGNOSIS — Z96651 Presence of right artificial knee joint: Secondary | ICD-10-CM | POA: Diagnosis not present

## 2020-10-27 DIAGNOSIS — I82402 Acute embolism and thrombosis of unspecified deep veins of left lower extremity: Secondary | ICD-10-CM | POA: Diagnosis not present

## 2020-10-27 DIAGNOSIS — J309 Allergic rhinitis, unspecified: Secondary | ICD-10-CM | POA: Diagnosis not present

## 2020-10-27 DIAGNOSIS — E782 Mixed hyperlipidemia: Secondary | ICD-10-CM | POA: Diagnosis not present

## 2020-10-27 DIAGNOSIS — I2602 Saddle embolus of pulmonary artery with acute cor pulmonale: Secondary | ICD-10-CM | POA: Diagnosis not present

## 2020-10-27 DIAGNOSIS — Z7901 Long term (current) use of anticoagulants: Secondary | ICD-10-CM | POA: Diagnosis not present

## 2020-10-27 DIAGNOSIS — K219 Gastro-esophageal reflux disease without esophagitis: Secondary | ICD-10-CM | POA: Diagnosis not present

## 2020-10-27 DIAGNOSIS — M1991 Primary osteoarthritis, unspecified site: Secondary | ICD-10-CM | POA: Diagnosis not present

## 2020-10-27 DIAGNOSIS — M810 Age-related osteoporosis without current pathological fracture: Secondary | ICD-10-CM | POA: Diagnosis not present

## 2020-10-27 DIAGNOSIS — H4010X0 Unspecified open-angle glaucoma, stage unspecified: Secondary | ICD-10-CM | POA: Diagnosis not present

## 2020-10-29 DIAGNOSIS — M6259 Muscle wasting and atrophy, not elsewhere classified, multiple sites: Secondary | ICD-10-CM | POA: Diagnosis not present

## 2020-10-29 DIAGNOSIS — J309 Allergic rhinitis, unspecified: Secondary | ICD-10-CM | POA: Diagnosis not present

## 2020-10-29 DIAGNOSIS — E782 Mixed hyperlipidemia: Secondary | ICD-10-CM | POA: Diagnosis not present

## 2020-10-29 DIAGNOSIS — M810 Age-related osteoporosis without current pathological fracture: Secondary | ICD-10-CM | POA: Diagnosis not present

## 2020-10-29 DIAGNOSIS — K219 Gastro-esophageal reflux disease without esophagitis: Secondary | ICD-10-CM | POA: Diagnosis not present

## 2020-10-29 DIAGNOSIS — I2602 Saddle embolus of pulmonary artery with acute cor pulmonale: Secondary | ICD-10-CM | POA: Diagnosis not present

## 2020-11-04 DIAGNOSIS — J309 Allergic rhinitis, unspecified: Secondary | ICD-10-CM | POA: Diagnosis not present

## 2020-11-04 DIAGNOSIS — E782 Mixed hyperlipidemia: Secondary | ICD-10-CM | POA: Diagnosis not present

## 2020-11-04 DIAGNOSIS — I2602 Saddle embolus of pulmonary artery with acute cor pulmonale: Secondary | ICD-10-CM | POA: Diagnosis not present

## 2020-11-04 DIAGNOSIS — M810 Age-related osteoporosis without current pathological fracture: Secondary | ICD-10-CM | POA: Diagnosis not present

## 2020-11-04 DIAGNOSIS — K219 Gastro-esophageal reflux disease without esophagitis: Secondary | ICD-10-CM | POA: Diagnosis not present

## 2020-11-04 DIAGNOSIS — M6259 Muscle wasting and atrophy, not elsewhere classified, multiple sites: Secondary | ICD-10-CM | POA: Diagnosis not present

## 2020-11-10 DIAGNOSIS — H401123 Primary open-angle glaucoma, left eye, severe stage: Secondary | ICD-10-CM | POA: Diagnosis not present

## 2020-11-11 DIAGNOSIS — D860 Sarcoidosis of lung: Secondary | ICD-10-CM | POA: Diagnosis not present

## 2020-11-11 DIAGNOSIS — Z1152 Encounter for screening for COVID-19: Secondary | ICD-10-CM | POA: Diagnosis not present

## 2020-11-11 DIAGNOSIS — D649 Anemia, unspecified: Secondary | ICD-10-CM | POA: Diagnosis not present

## 2020-11-11 DIAGNOSIS — J90 Pleural effusion, not elsewhere classified: Secondary | ICD-10-CM | POA: Diagnosis not present

## 2020-11-11 DIAGNOSIS — R059 Cough, unspecified: Secondary | ICD-10-CM | POA: Diagnosis not present

## 2020-11-11 DIAGNOSIS — J069 Acute upper respiratory infection, unspecified: Secondary | ICD-10-CM | POA: Diagnosis not present

## 2020-11-11 DIAGNOSIS — D72829 Elevated white blood cell count, unspecified: Secondary | ICD-10-CM | POA: Diagnosis not present

## 2020-11-18 DIAGNOSIS — E782 Mixed hyperlipidemia: Secondary | ICD-10-CM | POA: Diagnosis not present

## 2020-11-18 DIAGNOSIS — K219 Gastro-esophageal reflux disease without esophagitis: Secondary | ICD-10-CM | POA: Diagnosis not present

## 2020-11-18 DIAGNOSIS — M6259 Muscle wasting and atrophy, not elsewhere classified, multiple sites: Secondary | ICD-10-CM | POA: Diagnosis not present

## 2020-11-18 DIAGNOSIS — J309 Allergic rhinitis, unspecified: Secondary | ICD-10-CM | POA: Diagnosis not present

## 2020-11-18 DIAGNOSIS — I2602 Saddle embolus of pulmonary artery with acute cor pulmonale: Secondary | ICD-10-CM | POA: Diagnosis not present

## 2020-11-18 DIAGNOSIS — M810 Age-related osteoporosis without current pathological fracture: Secondary | ICD-10-CM | POA: Diagnosis not present

## 2020-11-19 DIAGNOSIS — J069 Acute upper respiratory infection, unspecified: Secondary | ICD-10-CM | POA: Diagnosis not present

## 2020-11-19 DIAGNOSIS — D649 Anemia, unspecified: Secondary | ICD-10-CM | POA: Diagnosis not present

## 2020-11-19 DIAGNOSIS — D860 Sarcoidosis of lung: Secondary | ICD-10-CM | POA: Diagnosis not present

## 2020-11-19 DIAGNOSIS — Z1152 Encounter for screening for COVID-19: Secondary | ICD-10-CM | POA: Diagnosis not present

## 2020-11-19 DIAGNOSIS — R059 Cough, unspecified: Secondary | ICD-10-CM | POA: Diagnosis not present

## 2020-11-19 DIAGNOSIS — J90 Pleural effusion, not elsewhere classified: Secondary | ICD-10-CM | POA: Diagnosis not present

## 2020-11-19 DIAGNOSIS — D72829 Elevated white blood cell count, unspecified: Secondary | ICD-10-CM | POA: Diagnosis not present

## 2020-11-20 ENCOUNTER — Other Ambulatory Visit: Payer: Self-pay | Admitting: Internal Medicine

## 2020-11-20 ENCOUNTER — Ambulatory Visit
Admission: RE | Admit: 2020-11-20 | Discharge: 2020-11-20 | Disposition: A | Discharge status: Home / Self Care | Payer: Medicare Other | Source: Ambulatory Visit | Attending: Internal Medicine | Admitting: Internal Medicine

## 2020-11-20 ENCOUNTER — Other Ambulatory Visit: Payer: Self-pay

## 2020-11-20 DIAGNOSIS — R0602 Shortness of breath: Secondary | ICD-10-CM | POA: Diagnosis not present

## 2020-11-20 DIAGNOSIS — J90 Pleural effusion, not elsewhere classified: Secondary | ICD-10-CM

## 2020-11-20 MED ORDER — IOPAMIDOL (ISOVUE-300) INJECTION 61%
75.0000 mL | Freq: Once | INTRAVENOUS | Status: AC | PRN
  Administered 2020-11-20: 75 mL via INTRAVENOUS

## 2020-11-24 ENCOUNTER — Other Ambulatory Visit (HOSPITAL_COMMUNITY): Payer: Self-pay | Admitting: *Deleted

## 2020-11-25 ENCOUNTER — Ambulatory Visit (HOSPITAL_COMMUNITY)
Admission: RE | Admit: 2020-11-25 | Discharge: 2020-11-25 | Disposition: A | Discharge status: Home / Self Care | Payer: Medicare Other | Source: Ambulatory Visit | Attending: Internal Medicine | Admitting: Internal Medicine

## 2020-11-25 ENCOUNTER — Other Ambulatory Visit: Payer: Self-pay

## 2020-11-25 DIAGNOSIS — D649 Anemia, unspecified: Secondary | ICD-10-CM | POA: Diagnosis not present

## 2020-11-25 LAB — PREPARE RBC (CROSSMATCH)

## 2020-11-25 MED ORDER — FUROSEMIDE 10 MG/ML IJ SOLN: 20.0000 mg | Freq: Once | INTRAMUSCULAR | Status: AC

## 2020-11-25 MED ORDER — SODIUM CHLORIDE 0.9% IV SOLUTION: Freq: Once | INTRAVENOUS | Status: DC

## 2020-11-25 MED ORDER — FUROSEMIDE 10 MG/ML IJ SOLN
INTRAMUSCULAR | Status: AC
  Administered 2020-11-25: 20 mg via INTRAVENOUS
  Filled 2020-11-25: qty 2

## 2020-11-26 DIAGNOSIS — I2602 Saddle embolus of pulmonary artery with acute cor pulmonale: Secondary | ICD-10-CM | POA: Diagnosis not present

## 2020-11-26 DIAGNOSIS — M1991 Primary osteoarthritis, unspecified site: Secondary | ICD-10-CM | POA: Diagnosis not present

## 2020-11-26 DIAGNOSIS — M6259 Muscle wasting and atrophy, not elsewhere classified, multiple sites: Secondary | ICD-10-CM | POA: Diagnosis not present

## 2020-11-26 DIAGNOSIS — M810 Age-related osteoporosis without current pathological fracture: Secondary | ICD-10-CM | POA: Diagnosis not present

## 2020-11-26 DIAGNOSIS — Z96651 Presence of right artificial knee joint: Secondary | ICD-10-CM | POA: Diagnosis not present

## 2020-11-26 DIAGNOSIS — K219 Gastro-esophageal reflux disease without esophagitis: Secondary | ICD-10-CM | POA: Diagnosis not present

## 2020-11-26 DIAGNOSIS — Z7901 Long term (current) use of anticoagulants: Secondary | ICD-10-CM | POA: Diagnosis not present

## 2020-11-26 DIAGNOSIS — Z8616 Personal history of COVID-19: Secondary | ICD-10-CM | POA: Diagnosis not present

## 2020-11-26 DIAGNOSIS — I119 Hypertensive heart disease without heart failure: Secondary | ICD-10-CM | POA: Diagnosis not present

## 2020-11-26 DIAGNOSIS — R911 Solitary pulmonary nodule: Secondary | ICD-10-CM | POA: Diagnosis not present

## 2020-11-26 DIAGNOSIS — H4010X0 Unspecified open-angle glaucoma, stage unspecified: Secondary | ICD-10-CM | POA: Diagnosis not present

## 2020-11-26 DIAGNOSIS — E782 Mixed hyperlipidemia: Secondary | ICD-10-CM | POA: Diagnosis not present

## 2020-11-26 DIAGNOSIS — I82402 Acute embolism and thrombosis of unspecified deep veins of left lower extremity: Secondary | ICD-10-CM | POA: Diagnosis not present

## 2020-11-26 DIAGNOSIS — J309 Allergic rhinitis, unspecified: Secondary | ICD-10-CM | POA: Diagnosis not present

## 2020-11-26 DIAGNOSIS — Z9181 History of falling: Secondary | ICD-10-CM | POA: Diagnosis not present

## 2020-11-26 LAB — TYPE AND SCREEN
ABO/RH(D): O NEG
Antibody Screen: NEGATIVE
Unit division: 0

## 2020-11-26 LAB — BPAM RBC
Blood Product Expiration Date: 202201112359
ISSUE DATE / TIME: 202201041010
Unit Type and Rh: 9500

## 2020-11-27 DIAGNOSIS — M810 Age-related osteoporosis without current pathological fracture: Secondary | ICD-10-CM | POA: Diagnosis not present

## 2020-11-27 DIAGNOSIS — I2602 Saddle embolus of pulmonary artery with acute cor pulmonale: Secondary | ICD-10-CM | POA: Diagnosis not present

## 2020-11-27 DIAGNOSIS — J309 Allergic rhinitis, unspecified: Secondary | ICD-10-CM | POA: Diagnosis not present

## 2020-11-27 DIAGNOSIS — E782 Mixed hyperlipidemia: Secondary | ICD-10-CM | POA: Diagnosis not present

## 2020-11-27 DIAGNOSIS — K219 Gastro-esophageal reflux disease without esophagitis: Secondary | ICD-10-CM | POA: Diagnosis not present

## 2020-11-27 DIAGNOSIS — M6259 Muscle wasting and atrophy, not elsewhere classified, multiple sites: Secondary | ICD-10-CM | POA: Diagnosis not present

## 2020-12-03 DIAGNOSIS — K219 Gastro-esophageal reflux disease without esophagitis: Secondary | ICD-10-CM | POA: Diagnosis not present

## 2020-12-03 DIAGNOSIS — I2602 Saddle embolus of pulmonary artery with acute cor pulmonale: Secondary | ICD-10-CM | POA: Diagnosis not present

## 2020-12-03 DIAGNOSIS — E782 Mixed hyperlipidemia: Secondary | ICD-10-CM | POA: Diagnosis not present

## 2020-12-03 DIAGNOSIS — M6259 Muscle wasting and atrophy, not elsewhere classified, multiple sites: Secondary | ICD-10-CM | POA: Diagnosis not present

## 2020-12-03 DIAGNOSIS — M810 Age-related osteoporosis without current pathological fracture: Secondary | ICD-10-CM | POA: Diagnosis not present

## 2020-12-03 DIAGNOSIS — J309 Allergic rhinitis, unspecified: Secondary | ICD-10-CM | POA: Diagnosis not present

## 2020-12-04 DIAGNOSIS — K219 Gastro-esophageal reflux disease without esophagitis: Secondary | ICD-10-CM | POA: Diagnosis not present

## 2020-12-04 DIAGNOSIS — I2602 Saddle embolus of pulmonary artery with acute cor pulmonale: Secondary | ICD-10-CM | POA: Diagnosis not present

## 2020-12-04 DIAGNOSIS — E782 Mixed hyperlipidemia: Secondary | ICD-10-CM | POA: Diagnosis not present

## 2020-12-04 DIAGNOSIS — M6259 Muscle wasting and atrophy, not elsewhere classified, multiple sites: Secondary | ICD-10-CM | POA: Diagnosis not present

## 2020-12-04 DIAGNOSIS — M810 Age-related osteoporosis without current pathological fracture: Secondary | ICD-10-CM | POA: Diagnosis not present

## 2020-12-04 DIAGNOSIS — J309 Allergic rhinitis, unspecified: Secondary | ICD-10-CM | POA: Diagnosis not present

## 2020-12-05 DIAGNOSIS — R059 Cough, unspecified: Secondary | ICD-10-CM | POA: Diagnosis not present

## 2020-12-05 DIAGNOSIS — J90 Pleural effusion, not elsewhere classified: Secondary | ICD-10-CM | POA: Diagnosis not present

## 2020-12-05 DIAGNOSIS — D649 Anemia, unspecified: Secondary | ICD-10-CM | POA: Diagnosis not present

## 2020-12-05 DIAGNOSIS — D72829 Elevated white blood cell count, unspecified: Secondary | ICD-10-CM | POA: Diagnosis not present

## 2020-12-05 DIAGNOSIS — J069 Acute upper respiratory infection, unspecified: Secondary | ICD-10-CM | POA: Diagnosis not present

## 2020-12-05 DIAGNOSIS — D860 Sarcoidosis of lung: Secondary | ICD-10-CM | POA: Diagnosis not present

## 2020-12-05 DIAGNOSIS — K921 Melena: Secondary | ICD-10-CM | POA: Diagnosis not present

## 2020-12-09 DIAGNOSIS — E782 Mixed hyperlipidemia: Secondary | ICD-10-CM | POA: Diagnosis not present

## 2020-12-09 DIAGNOSIS — I2602 Saddle embolus of pulmonary artery with acute cor pulmonale: Secondary | ICD-10-CM | POA: Diagnosis not present

## 2020-12-09 DIAGNOSIS — M810 Age-related osteoporosis without current pathological fracture: Secondary | ICD-10-CM | POA: Diagnosis not present

## 2020-12-09 DIAGNOSIS — K219 Gastro-esophageal reflux disease without esophagitis: Secondary | ICD-10-CM | POA: Diagnosis not present

## 2020-12-09 DIAGNOSIS — J309 Allergic rhinitis, unspecified: Secondary | ICD-10-CM | POA: Diagnosis not present

## 2020-12-09 DIAGNOSIS — M6259 Muscle wasting and atrophy, not elsewhere classified, multiple sites: Secondary | ICD-10-CM | POA: Diagnosis not present

## 2020-12-10 DIAGNOSIS — Z86711 Personal history of pulmonary embolism: Secondary | ICD-10-CM | POA: Diagnosis not present

## 2020-12-10 DIAGNOSIS — R195 Other fecal abnormalities: Secondary | ICD-10-CM | POA: Diagnosis not present

## 2020-12-10 DIAGNOSIS — Z8679 Personal history of other diseases of the circulatory system: Secondary | ICD-10-CM | POA: Diagnosis not present

## 2020-12-10 DIAGNOSIS — Z86718 Personal history of other venous thrombosis and embolism: Secondary | ICD-10-CM | POA: Diagnosis not present

## 2020-12-10 DIAGNOSIS — D500 Iron deficiency anemia secondary to blood loss (chronic): Secondary | ICD-10-CM | POA: Diagnosis not present

## 2020-12-10 DIAGNOSIS — Z8701 Personal history of pneumonia (recurrent): Secondary | ICD-10-CM | POA: Diagnosis not present

## 2020-12-10 DIAGNOSIS — Z8616 Personal history of COVID-19: Secondary | ICD-10-CM | POA: Diagnosis not present

## 2020-12-12 DIAGNOSIS — J309 Allergic rhinitis, unspecified: Secondary | ICD-10-CM | POA: Diagnosis not present

## 2020-12-12 DIAGNOSIS — M810 Age-related osteoporosis without current pathological fracture: Secondary | ICD-10-CM | POA: Diagnosis not present

## 2020-12-12 DIAGNOSIS — E782 Mixed hyperlipidemia: Secondary | ICD-10-CM | POA: Diagnosis not present

## 2020-12-12 DIAGNOSIS — K219 Gastro-esophageal reflux disease without esophagitis: Secondary | ICD-10-CM | POA: Diagnosis not present

## 2020-12-12 DIAGNOSIS — M6259 Muscle wasting and atrophy, not elsewhere classified, multiple sites: Secondary | ICD-10-CM | POA: Diagnosis not present

## 2020-12-12 DIAGNOSIS — I2602 Saddle embolus of pulmonary artery with acute cor pulmonale: Secondary | ICD-10-CM | POA: Diagnosis not present

## 2020-12-15 DIAGNOSIS — M810 Age-related osteoporosis without current pathological fracture: Secondary | ICD-10-CM | POA: Diagnosis not present

## 2020-12-15 DIAGNOSIS — J309 Allergic rhinitis, unspecified: Secondary | ICD-10-CM | POA: Diagnosis not present

## 2020-12-15 DIAGNOSIS — E782 Mixed hyperlipidemia: Secondary | ICD-10-CM | POA: Diagnosis not present

## 2020-12-15 DIAGNOSIS — M6259 Muscle wasting and atrophy, not elsewhere classified, multiple sites: Secondary | ICD-10-CM | POA: Diagnosis not present

## 2020-12-15 DIAGNOSIS — K219 Gastro-esophageal reflux disease without esophagitis: Secondary | ICD-10-CM | POA: Diagnosis not present

## 2020-12-15 DIAGNOSIS — I2602 Saddle embolus of pulmonary artery with acute cor pulmonale: Secondary | ICD-10-CM | POA: Diagnosis not present

## 2020-12-18 DIAGNOSIS — M810 Age-related osteoporosis without current pathological fracture: Secondary | ICD-10-CM | POA: Diagnosis not present

## 2020-12-18 DIAGNOSIS — K219 Gastro-esophageal reflux disease without esophagitis: Secondary | ICD-10-CM | POA: Diagnosis not present

## 2020-12-18 DIAGNOSIS — I2602 Saddle embolus of pulmonary artery with acute cor pulmonale: Secondary | ICD-10-CM | POA: Diagnosis not present

## 2020-12-18 DIAGNOSIS — J309 Allergic rhinitis, unspecified: Secondary | ICD-10-CM | POA: Diagnosis not present

## 2020-12-18 DIAGNOSIS — M6259 Muscle wasting and atrophy, not elsewhere classified, multiple sites: Secondary | ICD-10-CM | POA: Diagnosis not present

## 2020-12-18 DIAGNOSIS — E782 Mixed hyperlipidemia: Secondary | ICD-10-CM | POA: Diagnosis not present

## 2020-12-23 DIAGNOSIS — I2602 Saddle embolus of pulmonary artery with acute cor pulmonale: Secondary | ICD-10-CM | POA: Diagnosis not present

## 2020-12-23 DIAGNOSIS — K219 Gastro-esophageal reflux disease without esophagitis: Secondary | ICD-10-CM | POA: Diagnosis not present

## 2020-12-23 DIAGNOSIS — M6259 Muscle wasting and atrophy, not elsewhere classified, multiple sites: Secondary | ICD-10-CM | POA: Diagnosis not present

## 2020-12-23 DIAGNOSIS — E782 Mixed hyperlipidemia: Secondary | ICD-10-CM | POA: Diagnosis not present

## 2020-12-23 DIAGNOSIS — J309 Allergic rhinitis, unspecified: Secondary | ICD-10-CM | POA: Diagnosis not present

## 2020-12-23 DIAGNOSIS — M810 Age-related osteoporosis without current pathological fracture: Secondary | ICD-10-CM | POA: Diagnosis not present

## 2020-12-24 DIAGNOSIS — K219 Gastro-esophageal reflux disease without esophagitis: Secondary | ICD-10-CM | POA: Diagnosis not present

## 2020-12-24 DIAGNOSIS — J309 Allergic rhinitis, unspecified: Secondary | ICD-10-CM | POA: Diagnosis not present

## 2020-12-24 DIAGNOSIS — I2602 Saddle embolus of pulmonary artery with acute cor pulmonale: Secondary | ICD-10-CM | POA: Diagnosis not present

## 2020-12-24 DIAGNOSIS — M810 Age-related osteoporosis without current pathological fracture: Secondary | ICD-10-CM | POA: Diagnosis not present

## 2020-12-24 DIAGNOSIS — E782 Mixed hyperlipidemia: Secondary | ICD-10-CM | POA: Diagnosis not present

## 2020-12-24 DIAGNOSIS — M6259 Muscle wasting and atrophy, not elsewhere classified, multiple sites: Secondary | ICD-10-CM | POA: Diagnosis not present

## 2020-12-26 DIAGNOSIS — L219 Seborrheic dermatitis, unspecified: Secondary | ICD-10-CM | POA: Diagnosis not present

## 2020-12-26 DIAGNOSIS — I2602 Saddle embolus of pulmonary artery with acute cor pulmonale: Secondary | ICD-10-CM | POA: Diagnosis not present

## 2020-12-26 DIAGNOSIS — Z96651 Presence of right artificial knee joint: Secondary | ICD-10-CM | POA: Diagnosis not present

## 2020-12-26 DIAGNOSIS — I119 Hypertensive heart disease without heart failure: Secondary | ICD-10-CM | POA: Diagnosis not present

## 2020-12-26 DIAGNOSIS — M6259 Muscle wasting and atrophy, not elsewhere classified, multiple sites: Secondary | ICD-10-CM | POA: Diagnosis not present

## 2020-12-26 DIAGNOSIS — M810 Age-related osteoporosis without current pathological fracture: Secondary | ICD-10-CM | POA: Diagnosis not present

## 2020-12-26 DIAGNOSIS — R911 Solitary pulmonary nodule: Secondary | ICD-10-CM | POA: Diagnosis not present

## 2020-12-26 DIAGNOSIS — Z7901 Long term (current) use of anticoagulants: Secondary | ICD-10-CM | POA: Diagnosis not present

## 2020-12-26 DIAGNOSIS — I82402 Acute embolism and thrombosis of unspecified deep veins of left lower extremity: Secondary | ICD-10-CM | POA: Diagnosis not present

## 2020-12-26 DIAGNOSIS — J309 Allergic rhinitis, unspecified: Secondary | ICD-10-CM | POA: Diagnosis not present

## 2020-12-26 DIAGNOSIS — Z9181 History of falling: Secondary | ICD-10-CM | POA: Diagnosis not present

## 2020-12-26 DIAGNOSIS — M1991 Primary osteoarthritis, unspecified site: Secondary | ICD-10-CM | POA: Diagnosis not present

## 2020-12-26 DIAGNOSIS — Z8616 Personal history of COVID-19: Secondary | ICD-10-CM | POA: Diagnosis not present

## 2020-12-26 DIAGNOSIS — E782 Mixed hyperlipidemia: Secondary | ICD-10-CM | POA: Diagnosis not present

## 2020-12-26 DIAGNOSIS — H4010X0 Unspecified open-angle glaucoma, stage unspecified: Secondary | ICD-10-CM | POA: Diagnosis not present

## 2020-12-26 DIAGNOSIS — K219 Gastro-esophageal reflux disease without esophagitis: Secondary | ICD-10-CM | POA: Diagnosis not present

## 2020-12-28 NOTE — Progress Notes (Unsigned)
Cardiology Office Note:    Date:  12/30/2020   ID:  Jaime [name], DOB 11/30/1931, MRN [id number]  PCP:  Crist Infante, MD  Cardiologist:  No primary care provider on file.   Referring MD: Crist Infante, MD   Chief Complaint  Patient presents with  . Shortness of Breath  . Congestive Heart Failure    History of Present Illness:    Jaime [name] is a 87 y.o. female with a hx of  Elevated troponin I during Covid -19 infection August [id number].,  Acute diastolic CHF, iron deficiency anemia, and acute saddle PE.  Also background history of sarcoidosis.  Hospitalized in late August 2021 with hypoxic respiratory failure, COVID-19 positivity, saddle pulmonary embolism, and in conjunction had elevated flat troponin levels of 1617-> 1076-> and 962.  She is here today because Dr. Joylene Draft is concerned about the possibility of heart failure.  Starting in late December after a right pleural effusion was identified, and Lasix 40 mg/day was prescribed.  At that time there was also worsening of bilateral lower extremity edema.  After starting furosemide and potassium, lower extremity edema significantly improved.  A repeat x-ray 2 weeks later revealed resolving pleural effusion.  Furosemide was decreased to 20 mg/day.  2 nights ago she rolled over onto her right side and could not catch her breath.  She had to roll back to the left side.  She denies chest discomfort.  She has not had palpitations or syncope.  She does have relatively tense lower extremity edema which she states predated pulmonary embolism.  However, the edema did decrease with furosemide and has continued to improve now that she is wearing support stockings despite the decrease in furosemide intensity.  Past Medical History:  Diagnosis Date  . Arthritis    knees  . Complication of anesthesia    problem 40 yrs ago Doctor told her anesthesia drugs caused a reaction, BP drops  . Hypertension     Past Surgical History:  Procedure Laterality  Date  . ABDOMINAL HYSTERECTOMY    . CATARACT EXTRACTION W/PHACO Left 08/09/2018   Procedure: CATARACT EXTRACTION PHACO AND INTRAOCULAR LENS PLACEMENT ([place])  ISTENT AND ISTENT INJECT LEFT;  Surgeon: Leandrew Koyanagi, MD;  Location: [place];  Service: Ophthalmology;  Laterality: Left;  . CHOLECYSTECTOMY    . EYE SURGERY     cataract with lens implant-right  . INSERTION OF ANTERIOR SEGMENT AQUEOUS DRAINAGE DEVICE (ISTENT) Left 08/09/2018   Procedure: INSERTION OF ANTERIOR SEGMENT AQUEOUS DRAINAGE DEVICE (ISTENT);  Surgeon: Leandrew Koyanagi, MD;  Location: [facility];  Service: Ophthalmology;  Laterality: Left;  . TOTAL KNEE ARTHROPLASTY Right 02/27/2014   Procedure: RIGHT TOTAL KNEE ARTHROPLASTY;  Surgeon: Tobi Bastos, MD;  Location: [facility];  Service: Orthopedics;  Laterality: Right;  . TUBAL LIGATION      Current Medications: Current Meds  Medication Sig  . albuterol (VENTOLIN HFA) 108 (90 Base) MCG/ACT inhaler Inhale 2 puffs into the lungs every 4 (four) hours as needed for wheezing or shortness of breath.  Marland Kitchen alendronate (FOSAMAX) 70 MG tablet Take 70 mg by mouth once a week. Take with a full glass of water on an empty stomach.  . brimonidine (ALPHAGAN) 0.2 % ophthalmic solution Place 1 drop into the left eye 2 (two) times daily.  Marland Kitchen buPROPion (WELLBUTRIN XL) 150 MG 24 hr tablet Take 150 mg by mouth daily.   . Calcium Carb-Cholecalciferol (CALCIUM 600 + D PO) Take 1 tablet by mouth daily.   Marland Kitchen  dorzolamide-timolol (COSOPT) 22.3-6.8 MG/ML ophthalmic solution Place 1 drop into both eyes 2 (two) times daily.  . Fe Fum-FePoly-Vit C-Vit B3 (INTEGRA) 62.5-62.5-40-3 MG CAPS Take 1 capsule by mouth daily.  . fluticasone (FLONASE) 50 MCG/ACT nasal spray Place 1 spray into both nostrils daily as needed for allergies or rhinitis.  . furosemide (LASIX) 40 MG tablet Take 20 mg by mouth daily.  Marland Kitchen KLOR-CON M20 20 MEQ tablet Take 10 mEq by mouth daily.  Marland Kitchen latanoprost (XALATAN)  0.005 % ophthalmic solution Place 1 drop into the left eye at bedtime.  . Multiple Vitamins-Minerals (CENTRUM SILVER PO) Take 1 tablet by mouth daily. Gummies  . pantoprazole (PROTONIX) 40 MG tablet Take 40 mg by mouth 2 (two) times daily.  . rosuvastatin (CRESTOR) 10 MG tablet Take 5 mg by mouth daily.  . Vitamin D, Cholecalciferol, 400 units TABS Take 1 tablet by mouth daily.      Allergies:   Codeine, Meperidine hcl, Sulfa antibiotics, Adhesive [tape], and Sulfonamide derivatives   Social History   Socioeconomic History  . Marital status: Single    Spouse name: Not on file  . Number of children: Not on file  . Years of education: Not on file  . Highest education level: Not on file  Occupational History  . Not on file  Tobacco Use  . Smoking status: Never Smoker  . Smokeless tobacco: Never Used  Vaping Use  . Vaping Use: Never used  Substance and Sexual Activity  . Alcohol use: No  . Drug use: No  . Sexual activity: Not on file  Other Topics Concern  . Not on file  Social History Narrative   ** Merged History Encounter **       Social Determinants of Health   Financial Resource Strain: Not on file  Food Insecurity: Not on file  Transportation Needs: Not on file  Physical Activity: Not on file  Stress: Not on file  Social Connections: Not on file     Family History: The patient's family history includes Heart attack in her father; Ovarian cancer in her mother.  ROS:   Please see the history of present illness.    She denies orthopnea.  She does have bilateral lower extremity swelling.  She denies chest pain.  She is anxious when she comes to see doctors.  She developed significant anemia on Eliquis which was being used to treat saddle pulmonary emboli.  She had to have a blood transfusion for hemoglobin less than 8 in January.  Hemoglobin has not been repeated.  All other systems reviewed and are negative.  EKGs/Labs/Other Studies Reviewed:    The following  studies were reviewed today:  2D Doppler echocardiogram July 22, 2020: IMPRESSIONS  1. Left ventricular ejection fraction, by estimation, is 55 to 60%. The  left ventricle has normal function. The left ventricle has no regional  wall motion abnormalities. There is mild left ventricular hypertrophy.  Left ventricular diastolic parameters  are consistent with Grade I diastolic dysfunction (impaired relaxation).  2. Right ventricular systolic function is moderately reduced.  Preservation of RV apical function with severe hypokinesis of the RV free  wall is consistent with McConnell's sign seen with pulmonary embolism. The  right ventricular size is moderately  enlarged. There is moderately elevated pulmonary artery systolic pressure.  The estimated right ventricular systolic pressure is [id number] mmHg. Mildly  D-shaped interventricular septum suggests RV pressure/volume overload.  3. Left atrial size was mildly dilated.  4. Right atrial size was mildly  dilated.  5. The mitral valve is normal in structure. No evidence of mitral valve  regurgitation. No evidence of mitral stenosis.  6. The aortic valve is tricuspid. Aortic valve regurgitation is mild.  Mild aortic valve sclerosis is present, with no evidence of aortic valve  stenosis.  7. The inferior vena cava is dilated in size with <50% respiratory  variability, suggesting right atrial pressure of 15 mmHg.   Chest CT scan November 20, 2020: IMPRESSION: No findings suspicious for pneumonia. Lingular and right lower lobe opacities favor atelectasis.  Moderate right pleural effusion, increased. Trace left pleural effusion, new. No frank interstitial edema.  6 mm central right upper lobe nodule, unchanged from recent CT, but new from 2017. Follow-up CT chest is suggested in 6 months.  EKG:  EKG performed September 13, 2020 reveals normal sinus rhythm with normal overall appearance.  Recent Labs: 07/22/2020: B Natriuretic Peptide  340.9 07/27/2020: ALT 18 09/13/2020: BUN 18; Creatinine, Ser 1.00; Hemoglobin 10.6; Magnesium 2.4; Platelets 274; Potassium 4.3; Sodium 138  Recent Lipid Panel    Component Value Date/Time   TRIG 101 07/21/2020 2013    Physical Exam:    VS:  BP (!) 162/72   Pulse (!) 57   Ht 5\' 5"  (1.651 m)   Wt 189 lb 3.2 oz (85.8 kg)   SpO2 100%   BMI 31.48 kg/m     Wt Readings from Last 3 Encounters:  12/30/20 189 lb 3.2 oz (85.8 kg)  09/13/20 180 lb (81.6 kg)  07/21/20 200 lb (90.7 kg)     GEN: Elderly with good skin color. No acute distress HEENT: Normal NECK: Noticeable CV wave with the patient sitting with legs dangling.  With the patient lying at 45 degrees the CV wave has at the level of the earlobe.  JVD. LYMPHATICS: No lymphadenopathy CARDIAC: No murmur. RRR no gallop, or edema. VASCULAR:  Normal Pulses. No bruits. RESPIRATORY:  Clear to auscultation without rales, wheezing or rhonchi  ABDOMEN: Soft, non-tender, non-distended, No pulsatile mass, MUSCULOSKELETAL: No deformity  SKIN: Warm and dry NEUROLOGIC:  Alert and oriented x 3 PSYCHIATRIC:  Normal affect   ASSESSMENT:    1. Acute on chronic combined systolic and diastolic CHF (congestive heart failure) ([place])   2. Pulmonary hypertension, unspecified ([place])   3. Mixed hyperlipidemia   4. Acute hypoxemic respiratory failure due to COVID-19 ([place])   5. Acute saddle pulmonary embolism with acute cor pulmonale (H[facility])    PLAN:    In order of problems listed above:  1. The clinical exam, right pleural effusion, and clinical course with lower extremity severe edema suggests right heart failure related to either pulmonary hypertension that is a residual of saddle emboli or diastolic left heart failure.  At the time of acute presentation PA systolic pressure was around 50 mmHg.  I did not hear a tricuspid regurgitation murmur today.  Plan repeat 2D Doppler echocardiogram and brain natruretic peptide to evaluate current state.  We will  also get a CBC, basic metabolic panel, and TSH.  Further management will be dependent upon findings. 2. Suspected on exam 3. Not discussed 4. Resolved acutely in September 2021. 5. Rule out chronic pulmonary hypertension related to incompletely resolved pulmonary thrombi.   Medication Adjustments/Labs and Tests Ordered: Current medicines are reviewed at length with the patient today.  Concerns regarding medicines are outlined above.  Orders Placed This Encounter  Procedures  . Basic metabolic panel  . CBC  . Pro b natriuretic peptide (BNP)  .  TSH  . ECHOCARDIOGRAM COMPLETE   No orders of the defined types were placed in this encounter.   Patient Instructions  Medication Instructions:  Your physician recommends that you continue on your current medications as directed. Please refer to the Current Medication list given to you today.  *If you need a refill on your cardiac medications before your next appointment, please call your pharmacy*   Lab Work: TODAY: CBC,BNP,BMET, TSH If you have labs (blood work) drawn today and your tests are completely normal, you will receive your results only by: Marland Kitchen MyChart Message (if you have MyChart) OR . A paper copy in the mail If you have any lab test that is abnormal or we need to change your treatment, we will call you to review the results.   Testing/Procedures: Your physician has requested that you have an echocardiogram. Echocardiography is a painless test that uses sound waves to create images of your heart. It provides your doctor with information about the size and shape of your heart and how well your heart's chambers and valves are working. This procedure takes approximately one hour. There are no restrictions for this procedure.     Follow-Up: At [facility], you and your health needs are our priority.  As part of our continuing mission to provide you with exceptional heart care, we have created designated Provider Care Teams.   These Care Teams include your primary Cardiologist (physician) and Advanced Practice Providers (APPs -  Physician Assistants and Nurse Practitioners) who all work together to provide you with the care you need, when you need it.  We recommend signing up for the patient portal called "MyChart".  Sign up information is provided on this After Visit Summary.  MyChart is used to connect with patients for Virtual Visits (Telemedicine).  Patients are able to view lab/test results, encounter notes, upcoming appointments, etc.  Non-urgent messages can be sent to your provider as well.   To learn more about what you can do with MyChart, go to [email or web address].    Your next appointment:   As needed   The format for your next appointment:   In Person  Provider:   You may see No primary care provider on file. or one of the following Advanced Practice Providers on your designated Care Team:    Kathyrn Drown, NP      Signed, Sinclair Grooms, MD  12/30/2020 3:17 PM    [place]

## 2020-12-30 ENCOUNTER — Encounter: Payer: Self-pay | Admitting: Interventional Cardiology

## 2020-12-30 ENCOUNTER — Ambulatory Visit (INDEPENDENT_AMBULATORY_CARE_PROVIDER_SITE_OTHER): Payer: Medicare Other | Admitting: Interventional Cardiology

## 2020-12-30 ENCOUNTER — Other Ambulatory Visit: Payer: Self-pay

## 2020-12-30 VITALS — BP 162/72 | HR 57 | Ht 65.0 in | Wt 189.2 lb

## 2020-12-30 DIAGNOSIS — E782 Mixed hyperlipidemia: Secondary | ICD-10-CM

## 2020-12-30 DIAGNOSIS — I2602 Saddle embolus of pulmonary artery with acute cor pulmonale: Secondary | ICD-10-CM | POA: Diagnosis not present

## 2020-12-30 DIAGNOSIS — U071 COVID-19: Secondary | ICD-10-CM | POA: Diagnosis not present

## 2020-12-30 DIAGNOSIS — I272 Pulmonary hypertension, unspecified: Secondary | ICD-10-CM

## 2020-12-30 DIAGNOSIS — I5043 Acute on chronic combined systolic (congestive) and diastolic (congestive) heart failure: Secondary | ICD-10-CM

## 2020-12-30 DIAGNOSIS — J9601 Acute respiratory failure with hypoxia: Secondary | ICD-10-CM

## 2020-12-30 NOTE — Patient Instructions (Signed)
Medication Instructions:  Your physician recommends that you continue on your current medications as directed. Please refer to the Current Medication list given to you today.  *If you need a refill on your cardiac medications before your next appointment, please call your pharmacy*   Lab Work: TODAY: CBC,BNP,BMET, TSH If you have labs (blood work) drawn today and your tests are completely normal, you will receive your results only by: Marland Kitchen MyChart Message (if you have MyChart) OR . A paper copy in the mail If you have any lab test that is abnormal or we need to change your treatment, we will call you to review the results.   Testing/Procedures: Your physician has requested that you have an echocardiogram. Echocardiography is a painless test that uses sound waves to create images of your heart. It provides your doctor with information about the size and shape of your heart and how well your heart's chambers and valves are working. This procedure takes approximately one hour. There are no restrictions for this procedure.     Follow-Up: At [facility], you and your health needs are our priority.  As part of our continuing mission to provide you with exceptional heart care, we have created designated Provider Care Teams.  These Care Teams include your primary Cardiologist (physician) and Advanced Practice Providers (APPs -  Physician Assistants and Nurse Practitioners) who all work together to provide you with the care you need, when you need it.  We recommend signing up for the patient portal called "MyChart".  Sign up information is provided on this After Visit Summary.  MyChart is used to connect with patients for Virtual Visits (Telemedicine).  Patients are able to view lab/test results, encounter notes, upcoming appointments, etc.  Non-urgent messages can be sent to your provider as well.   To learn more about what you can do with MyChart, go to [email or web address].    Your next  appointment:   As needed   The format for your next appointment:   In Person  Provider:   You may see No primary care provider on file. or one of the following Advanced Practice Providers on your designated Care Team:    Kathyrn Drown, NP

## 2020-12-31 LAB — BASIC METABOLIC PANEL
BUN/Creatinine Ratio: 21 (ref 12–28)
BUN: 20 mg/dL (ref 8–27)
CO2: 23 mmol/L (ref 20–29)
Calcium: 9.2 mg/dL (ref 8.7–10.3)
Chloride: 109 mmol/L — ABNORMAL HIGH (ref 96–106)
Creatinine, Ser: 0.96 mg/dL (ref 0.57–1.00)
GFR calc Af Amer: 62 mL/min/{1.73_m2} (ref >59)
GFR calc non Af Amer: 54 mL/min/{1.73_m2} — ABNORMAL LOW (ref >59)
Glucose: 89 mg/dL (ref 65–99)
Potassium: 5.2 mmol/L (ref 3.5–5.2)
Sodium: 144 mmol/L (ref 134–144)

## 2020-12-31 LAB — CBC
Hematocrit: 25.8 % — ABNORMAL LOW (ref 34.0–46.6)
Hemoglobin: 7.7 g/dL — ABNORMAL LOW (ref 11.1–15.9)
MCH: 23.2 pg — ABNORMAL LOW (ref 26.6–33.0)
MCHC: 29.8 g/dL — ABNORMAL LOW (ref 31.5–35.7)
MCV: 78 fL — ABNORMAL LOW (ref 79–97)
Platelets: 241 10*3/uL (ref 150–450)
RBC: 3.32 x10E6/uL — ABNORMAL LOW (ref 3.77–5.28)
RDW: 16.4 % — ABNORMAL HIGH (ref 11.7–15.4)
WBC: 6.7 10*3/uL (ref 3.4–10.8)

## 2020-12-31 LAB — PRO B NATRIURETIC PEPTIDE: NT-Pro BNP: 800 pg/mL — ABNORMAL HIGH (ref 0–738)

## 2020-12-31 LAB — TSH: TSH: 1.35 u[IU]/mL (ref 0.450–4.500)

## 2021-01-01 DIAGNOSIS — D860 Sarcoidosis of lung: Secondary | ICD-10-CM | POA: Diagnosis not present

## 2021-01-01 DIAGNOSIS — F32A Depression, unspecified: Secondary | ICD-10-CM | POA: Diagnosis not present

## 2021-01-01 DIAGNOSIS — D649 Anemia, unspecified: Secondary | ICD-10-CM | POA: Diagnosis not present

## 2021-01-01 DIAGNOSIS — J90 Pleural effusion, not elsewhere classified: Secondary | ICD-10-CM | POA: Diagnosis not present

## 2021-01-01 DIAGNOSIS — R059 Cough, unspecified: Secondary | ICD-10-CM | POA: Diagnosis not present

## 2021-01-01 NOTE — Progress Notes (Signed)
Message left at office (voicemail of Rhona Leavens) for orders for blood transfusion scheduled on 01/02/21

## 2021-01-02 ENCOUNTER — Inpatient Hospital Stay (HOSPITAL_COMMUNITY)
Admission: RE | Admit: 2021-01-02 | Discharge: 2021-01-02 | Disposition: A | Discharge status: Home / Self Care | Payer: Medicare Other | Source: Ambulatory Visit | Attending: Internal Medicine | Admitting: Internal Medicine

## 2021-01-02 DIAGNOSIS — J309 Allergic rhinitis, unspecified: Secondary | ICD-10-CM | POA: Diagnosis not present

## 2021-01-02 DIAGNOSIS — K219 Gastro-esophageal reflux disease without esophagitis: Secondary | ICD-10-CM | POA: Diagnosis not present

## 2021-01-02 DIAGNOSIS — E782 Mixed hyperlipidemia: Secondary | ICD-10-CM | POA: Diagnosis not present

## 2021-01-02 DIAGNOSIS — M6259 Muscle wasting and atrophy, not elsewhere classified, multiple sites: Secondary | ICD-10-CM | POA: Diagnosis not present

## 2021-01-02 DIAGNOSIS — I2602 Saddle embolus of pulmonary artery with acute cor pulmonale: Secondary | ICD-10-CM | POA: Diagnosis not present

## 2021-01-02 DIAGNOSIS — M810 Age-related osteoporosis without current pathological fracture: Secondary | ICD-10-CM | POA: Diagnosis not present

## 2021-01-07 ENCOUNTER — Other Ambulatory Visit (HOSPITAL_COMMUNITY): Payer: Self-pay | Admitting: *Deleted

## 2021-01-07 NOTE — Addendum Note (Signed)
Addended by: Domingo Madeira on: 01/07/2021 12:30 PM   Modules accepted: Orders

## 2021-01-08 ENCOUNTER — Other Ambulatory Visit: Payer: Self-pay

## 2021-01-08 ENCOUNTER — Ambulatory Visit (HOSPITAL_COMMUNITY)
Admission: RE | Admit: 2021-01-08 | Discharge: 2021-01-08 | Disposition: A | Discharge status: Home / Self Care | Payer: Medicare Other | Source: Ambulatory Visit | Attending: Internal Medicine | Admitting: Internal Medicine

## 2021-01-08 DIAGNOSIS — D649 Anemia, unspecified: Secondary | ICD-10-CM | POA: Diagnosis not present

## 2021-01-08 LAB — PREPARE RBC (CROSSMATCH)

## 2021-01-08 LAB — HEMOGLOBIN AND HEMATOCRIT, BLOOD
HCT: 32.5 % — ABNORMAL LOW (ref 36.0–46.0)
Hemoglobin: 9.3 g/dL — ABNORMAL LOW (ref 12.0–15.0)

## 2021-01-08 MED ORDER — SODIUM CHLORIDE 0.9% IV SOLUTION: Freq: Once | INTRAVENOUS | Status: DC

## 2021-01-08 MED ORDER — ACETAMINOPHEN 325 MG PO TABS
ORAL_TABLET | ORAL | Status: AC
  Filled 2021-01-08: qty 2

## 2021-01-08 MED ORDER — ACETAMINOPHEN 325 MG PO TABS
650.0000 mg | ORAL_TABLET | ORAL | Status: AC
  Administered 2021-01-08: 650 mg via ORAL

## 2021-01-08 NOTE — Progress Notes (Signed)
After 1 unit of blood, hemoglobin 9.3/hematocrit 32.5. Per blood bank, patient does not meet requirements for second unit. Pt verbalized understanding. Daughter called to pick patient up, also verbalized understanding.

## 2021-01-09 DIAGNOSIS — E782 Mixed hyperlipidemia: Secondary | ICD-10-CM | POA: Diagnosis not present

## 2021-01-09 DIAGNOSIS — I2602 Saddle embolus of pulmonary artery with acute cor pulmonale: Secondary | ICD-10-CM | POA: Diagnosis not present

## 2021-01-09 DIAGNOSIS — M6259 Muscle wasting and atrophy, not elsewhere classified, multiple sites: Secondary | ICD-10-CM | POA: Diagnosis not present

## 2021-01-09 DIAGNOSIS — K219 Gastro-esophageal reflux disease without esophagitis: Secondary | ICD-10-CM | POA: Diagnosis not present

## 2021-01-09 DIAGNOSIS — M810 Age-related osteoporosis without current pathological fracture: Secondary | ICD-10-CM | POA: Diagnosis not present

## 2021-01-09 DIAGNOSIS — J309 Allergic rhinitis, unspecified: Secondary | ICD-10-CM | POA: Diagnosis not present

## 2021-01-09 LAB — BPAM RBC
Blood Product Expiration Date: 202203182359
ISSUE DATE / TIME: 202202171022
Unit Type and Rh: 5100

## 2021-01-09 LAB — TYPE AND SCREEN
ABO/RH(D): O NEG
Antibody Screen: NEGATIVE
Unit division: 0

## 2021-01-14 DIAGNOSIS — M810 Age-related osteoporosis without current pathological fracture: Secondary | ICD-10-CM | POA: Diagnosis not present

## 2021-01-14 DIAGNOSIS — K219 Gastro-esophageal reflux disease without esophagitis: Secondary | ICD-10-CM | POA: Diagnosis not present

## 2021-01-14 DIAGNOSIS — M6259 Muscle wasting and atrophy, not elsewhere classified, multiple sites: Secondary | ICD-10-CM | POA: Diagnosis not present

## 2021-01-14 DIAGNOSIS — I2602 Saddle embolus of pulmonary artery with acute cor pulmonale: Secondary | ICD-10-CM | POA: Diagnosis not present

## 2021-01-14 DIAGNOSIS — J309 Allergic rhinitis, unspecified: Secondary | ICD-10-CM | POA: Diagnosis not present

## 2021-01-14 DIAGNOSIS — E782 Mixed hyperlipidemia: Secondary | ICD-10-CM | POA: Diagnosis not present

## 2021-01-15 DIAGNOSIS — K219 Gastro-esophageal reflux disease without esophagitis: Secondary | ICD-10-CM | POA: Diagnosis not present

## 2021-01-15 DIAGNOSIS — M6259 Muscle wasting and atrophy, not elsewhere classified, multiple sites: Secondary | ICD-10-CM | POA: Diagnosis not present

## 2021-01-15 DIAGNOSIS — I2602 Saddle embolus of pulmonary artery with acute cor pulmonale: Secondary | ICD-10-CM | POA: Diagnosis not present

## 2021-01-15 DIAGNOSIS — E782 Mixed hyperlipidemia: Secondary | ICD-10-CM | POA: Diagnosis not present

## 2021-01-15 DIAGNOSIS — M810 Age-related osteoporosis without current pathological fracture: Secondary | ICD-10-CM | POA: Diagnosis not present

## 2021-01-15 DIAGNOSIS — J309 Allergic rhinitis, unspecified: Secondary | ICD-10-CM | POA: Diagnosis not present

## 2021-01-16 ENCOUNTER — Other Ambulatory Visit: Payer: Self-pay

## 2021-01-16 ENCOUNTER — Ambulatory Visit (HOSPITAL_COMMUNITY): Payer: Medicare Other | Attending: Internal Medicine

## 2021-01-16 DIAGNOSIS — I272 Pulmonary hypertension, unspecified: Secondary | ICD-10-CM | POA: Diagnosis not present

## 2021-01-16 MED ORDER — PERFLUTREN LIPID MICROSPHERE
1.0000 mL | INTRAVENOUS | Status: AC | PRN
  Administered 2021-01-16: 2 mL via INTRAVENOUS

## 2021-01-17 LAB — ECHOCARDIOGRAM COMPLETE
Area-P 1/2: 3.76 cm2
S' Lateral: 3.1 cm

## 2021-01-21 ENCOUNTER — Telehealth: Payer: Self-pay | Admitting: *Deleted

## 2021-01-21 DIAGNOSIS — E782 Mixed hyperlipidemia: Secondary | ICD-10-CM | POA: Diagnosis not present

## 2021-01-21 DIAGNOSIS — I2602 Saddle embolus of pulmonary artery with acute cor pulmonale: Secondary | ICD-10-CM | POA: Diagnosis not present

## 2021-01-21 DIAGNOSIS — K219 Gastro-esophageal reflux disease without esophagitis: Secondary | ICD-10-CM | POA: Diagnosis not present

## 2021-01-21 DIAGNOSIS — M810 Age-related osteoporosis without current pathological fracture: Secondary | ICD-10-CM | POA: Diagnosis not present

## 2021-01-21 DIAGNOSIS — M6259 Muscle wasting and atrophy, not elsewhere classified, multiple sites: Secondary | ICD-10-CM | POA: Diagnosis not present

## 2021-01-21 DIAGNOSIS — J309 Allergic rhinitis, unspecified: Secondary | ICD-10-CM | POA: Diagnosis not present

## 2021-01-21 MED ORDER — FUROSEMIDE 20 MG PO TABS: ORAL_TABLET | ORAL | 3 refills | Status: AC

## 2021-01-21 NOTE — Telephone Encounter (Signed)
-----   Message from Belva Crome, MD sent at 01/20/2021  4:58 PM EST ----- Let the patient know there is persistent elevation of pressure in lungs, slightly higher than in August after the PE. Stay on furosemide. May need slightly stronger dose. Alternate furosemide 40 mg with 20 mg daily. BMET and f/u in 1 month. A copy will be sent to Crist Infante, MD

## 2021-01-21 NOTE — Telephone Encounter (Signed)
Spoke with pt and went over results and recommendations per Dr. Tamala Julian. Scheduled pt to see Dr. Tamala Julian 4/7 and she will have labs done same day because her daughter is a Education officer, museum and it is hard to get her out here for two separate appts. Advised pt to call sooner if any issues.

## 2021-01-25 DIAGNOSIS — R911 Solitary pulmonary nodule: Secondary | ICD-10-CM | POA: Diagnosis not present

## 2021-01-25 DIAGNOSIS — Z7901 Long term (current) use of anticoagulants: Secondary | ICD-10-CM | POA: Diagnosis not present

## 2021-01-25 DIAGNOSIS — E782 Mixed hyperlipidemia: Secondary | ICD-10-CM | POA: Diagnosis not present

## 2021-01-25 DIAGNOSIS — I119 Hypertensive heart disease without heart failure: Secondary | ICD-10-CM | POA: Diagnosis not present

## 2021-01-25 DIAGNOSIS — Z96651 Presence of right artificial knee joint: Secondary | ICD-10-CM | POA: Diagnosis not present

## 2021-01-25 DIAGNOSIS — M6259 Muscle wasting and atrophy, not elsewhere classified, multiple sites: Secondary | ICD-10-CM | POA: Diagnosis not present

## 2021-01-25 DIAGNOSIS — I82402 Acute embolism and thrombosis of unspecified deep veins of left lower extremity: Secondary | ICD-10-CM | POA: Diagnosis not present

## 2021-01-25 DIAGNOSIS — Z9181 History of falling: Secondary | ICD-10-CM | POA: Diagnosis not present

## 2021-01-25 DIAGNOSIS — M1991 Primary osteoarthritis, unspecified site: Secondary | ICD-10-CM | POA: Diagnosis not present

## 2021-01-25 DIAGNOSIS — M810 Age-related osteoporosis without current pathological fracture: Secondary | ICD-10-CM | POA: Diagnosis not present

## 2021-01-25 DIAGNOSIS — K219 Gastro-esophageal reflux disease without esophagitis: Secondary | ICD-10-CM | POA: Diagnosis not present

## 2021-01-25 DIAGNOSIS — H4010X0 Unspecified open-angle glaucoma, stage unspecified: Secondary | ICD-10-CM | POA: Diagnosis not present

## 2021-01-25 DIAGNOSIS — Z8616 Personal history of COVID-19: Secondary | ICD-10-CM | POA: Diagnosis not present

## 2021-01-25 DIAGNOSIS — I2602 Saddle embolus of pulmonary artery with acute cor pulmonale: Secondary | ICD-10-CM | POA: Diagnosis not present

## 2021-01-25 DIAGNOSIS — J309 Allergic rhinitis, unspecified: Secondary | ICD-10-CM | POA: Diagnosis not present

## 2021-01-26 ENCOUNTER — Other Ambulatory Visit: Payer: Self-pay | Admitting: Gastroenterology

## 2021-01-26 DIAGNOSIS — Z86711 Personal history of pulmonary embolism: Secondary | ICD-10-CM | POA: Diagnosis not present

## 2021-01-26 DIAGNOSIS — R195 Other fecal abnormalities: Secondary | ICD-10-CM | POA: Diagnosis not present

## 2021-01-26 DIAGNOSIS — Z86718 Personal history of other venous thrombosis and embolism: Secondary | ICD-10-CM | POA: Diagnosis not present

## 2021-01-26 DIAGNOSIS — Z8679 Personal history of other diseases of the circulatory system: Secondary | ICD-10-CM | POA: Diagnosis not present

## 2021-01-26 DIAGNOSIS — D500 Iron deficiency anemia secondary to blood loss (chronic): Secondary | ICD-10-CM

## 2021-01-26 DIAGNOSIS — R1310 Dysphagia, unspecified: Secondary | ICD-10-CM | POA: Diagnosis not present

## 2021-01-26 DIAGNOSIS — D509 Iron deficiency anemia, unspecified: Secondary | ICD-10-CM | POA: Diagnosis not present

## 2021-01-26 DIAGNOSIS — Z8616 Personal history of COVID-19: Secondary | ICD-10-CM | POA: Diagnosis not present

## 2021-01-27 ENCOUNTER — Other Ambulatory Visit: Payer: Self-pay | Admitting: Gastroenterology

## 2021-01-27 DIAGNOSIS — D500 Iron deficiency anemia secondary to blood loss (chronic): Secondary | ICD-10-CM

## 2021-01-28 DIAGNOSIS — J309 Allergic rhinitis, unspecified: Secondary | ICD-10-CM | POA: Diagnosis not present

## 2021-01-28 DIAGNOSIS — K219 Gastro-esophageal reflux disease without esophagitis: Secondary | ICD-10-CM | POA: Diagnosis not present

## 2021-01-28 DIAGNOSIS — I2602 Saddle embolus of pulmonary artery with acute cor pulmonale: Secondary | ICD-10-CM | POA: Diagnosis not present

## 2021-01-28 DIAGNOSIS — M6259 Muscle wasting and atrophy, not elsewhere classified, multiple sites: Secondary | ICD-10-CM | POA: Diagnosis not present

## 2021-01-28 DIAGNOSIS — M810 Age-related osteoporosis without current pathological fracture: Secondary | ICD-10-CM | POA: Diagnosis not present

## 2021-01-28 DIAGNOSIS — E782 Mixed hyperlipidemia: Secondary | ICD-10-CM | POA: Diagnosis not present

## 2021-02-03 DIAGNOSIS — H401123 Primary open-angle glaucoma, left eye, severe stage: Secondary | ICD-10-CM | POA: Diagnosis not present

## 2021-02-04 ENCOUNTER — Other Ambulatory Visit: Payer: Medicare Other

## 2021-02-04 DIAGNOSIS — E782 Mixed hyperlipidemia: Secondary | ICD-10-CM | POA: Diagnosis not present

## 2021-02-04 DIAGNOSIS — J309 Allergic rhinitis, unspecified: Secondary | ICD-10-CM | POA: Diagnosis not present

## 2021-02-04 DIAGNOSIS — I2602 Saddle embolus of pulmonary artery with acute cor pulmonale: Secondary | ICD-10-CM | POA: Diagnosis not present

## 2021-02-04 DIAGNOSIS — M810 Age-related osteoporosis without current pathological fracture: Secondary | ICD-10-CM | POA: Diagnosis not present

## 2021-02-04 DIAGNOSIS — K219 Gastro-esophageal reflux disease without esophagitis: Secondary | ICD-10-CM | POA: Diagnosis not present

## 2021-02-04 DIAGNOSIS — M6259 Muscle wasting and atrophy, not elsewhere classified, multiple sites: Secondary | ICD-10-CM | POA: Diagnosis not present

## 2021-02-05 DIAGNOSIS — M6259 Muscle wasting and atrophy, not elsewhere classified, multiple sites: Secondary | ICD-10-CM | POA: Diagnosis not present

## 2021-02-05 DIAGNOSIS — M810 Age-related osteoporosis without current pathological fracture: Secondary | ICD-10-CM | POA: Diagnosis not present

## 2021-02-05 DIAGNOSIS — K219 Gastro-esophageal reflux disease without esophagitis: Secondary | ICD-10-CM | POA: Diagnosis not present

## 2021-02-05 DIAGNOSIS — I2602 Saddle embolus of pulmonary artery with acute cor pulmonale: Secondary | ICD-10-CM | POA: Diagnosis not present

## 2021-02-05 DIAGNOSIS — E782 Mixed hyperlipidemia: Secondary | ICD-10-CM | POA: Diagnosis not present

## 2021-02-05 DIAGNOSIS — J309 Allergic rhinitis, unspecified: Secondary | ICD-10-CM | POA: Diagnosis not present

## 2021-02-10 ENCOUNTER — Other Ambulatory Visit: Payer: Medicare Other

## 2021-02-10 DIAGNOSIS — H401123 Primary open-angle glaucoma, left eye, severe stage: Secondary | ICD-10-CM | POA: Diagnosis not present

## 2021-02-12 ENCOUNTER — Other Ambulatory Visit: Payer: Self-pay

## 2021-02-12 ENCOUNTER — Ambulatory Visit
Admission: RE | Admit: 2021-02-12 | Discharge: 2021-02-12 | Disposition: A | Discharge status: Home / Self Care | Payer: Medicare Other | Source: Ambulatory Visit | Attending: Gastroenterology | Admitting: Gastroenterology

## 2021-02-12 DIAGNOSIS — D500 Iron deficiency anemia secondary to blood loss (chronic): Secondary | ICD-10-CM

## 2021-02-12 DIAGNOSIS — K838 Other specified diseases of biliary tract: Secondary | ICD-10-CM | POA: Diagnosis not present

## 2021-02-12 DIAGNOSIS — K5730 Diverticulosis of large intestine without perforation or abscess without bleeding: Secondary | ICD-10-CM | POA: Diagnosis not present

## 2021-02-12 DIAGNOSIS — K6389 Other specified diseases of intestine: Secondary | ICD-10-CM | POA: Diagnosis not present

## 2021-02-12 DIAGNOSIS — K921 Melena: Secondary | ICD-10-CM | POA: Diagnosis not present

## 2021-02-12 MED ORDER — IOPAMIDOL (ISOVUE-300) INJECTION 61%
100.0000 mL | Freq: Once | INTRAVENOUS | Status: AC | PRN
  Administered 2021-02-12: 100 mL via INTRAVENOUS

## 2021-02-13 DIAGNOSIS — E782 Mixed hyperlipidemia: Secondary | ICD-10-CM | POA: Diagnosis not present

## 2021-02-13 DIAGNOSIS — M6259 Muscle wasting and atrophy, not elsewhere classified, multiple sites: Secondary | ICD-10-CM | POA: Diagnosis not present

## 2021-02-13 DIAGNOSIS — J309 Allergic rhinitis, unspecified: Secondary | ICD-10-CM | POA: Diagnosis not present

## 2021-02-13 DIAGNOSIS — K219 Gastro-esophageal reflux disease without esophagitis: Secondary | ICD-10-CM | POA: Diagnosis not present

## 2021-02-13 DIAGNOSIS — I2602 Saddle embolus of pulmonary artery with acute cor pulmonale: Secondary | ICD-10-CM | POA: Diagnosis not present

## 2021-02-13 DIAGNOSIS — M810 Age-related osteoporosis without current pathological fracture: Secondary | ICD-10-CM | POA: Diagnosis not present

## 2021-02-19 DIAGNOSIS — E782 Mixed hyperlipidemia: Secondary | ICD-10-CM | POA: Diagnosis not present

## 2021-02-19 DIAGNOSIS — M6259 Muscle wasting and atrophy, not elsewhere classified, multiple sites: Secondary | ICD-10-CM | POA: Diagnosis not present

## 2021-02-19 DIAGNOSIS — I2602 Saddle embolus of pulmonary artery with acute cor pulmonale: Secondary | ICD-10-CM | POA: Diagnosis not present

## 2021-02-19 DIAGNOSIS — J309 Allergic rhinitis, unspecified: Secondary | ICD-10-CM | POA: Diagnosis not present

## 2021-02-19 DIAGNOSIS — K219 Gastro-esophageal reflux disease without esophagitis: Secondary | ICD-10-CM | POA: Diagnosis not present

## 2021-02-19 DIAGNOSIS — M810 Age-related osteoporosis without current pathological fracture: Secondary | ICD-10-CM | POA: Diagnosis not present

## 2021-02-23 DIAGNOSIS — D860 Sarcoidosis of lung: Secondary | ICD-10-CM | POA: Diagnosis not present

## 2021-02-23 DIAGNOSIS — D649 Anemia, unspecified: Secondary | ICD-10-CM | POA: Diagnosis not present

## 2021-02-23 DIAGNOSIS — K921 Melena: Secondary | ICD-10-CM | POA: Diagnosis not present

## 2021-02-23 DIAGNOSIS — E785 Hyperlipidemia, unspecified: Secondary | ICD-10-CM | POA: Diagnosis not present

## 2021-02-23 DIAGNOSIS — J90 Pleural effusion, not elsewhere classified: Secondary | ICD-10-CM | POA: Diagnosis not present

## 2021-02-24 DIAGNOSIS — R911 Solitary pulmonary nodule: Secondary | ICD-10-CM | POA: Diagnosis not present

## 2021-02-24 DIAGNOSIS — Z8679 Personal history of other diseases of the circulatory system: Secondary | ICD-10-CM | POA: Diagnosis not present

## 2021-02-24 DIAGNOSIS — M1991 Primary osteoarthritis, unspecified site: Secondary | ICD-10-CM | POA: Diagnosis not present

## 2021-02-24 DIAGNOSIS — H4010X0 Unspecified open-angle glaucoma, stage unspecified: Secondary | ICD-10-CM | POA: Diagnosis not present

## 2021-02-24 DIAGNOSIS — Z7901 Long term (current) use of anticoagulants: Secondary | ICD-10-CM | POA: Diagnosis not present

## 2021-02-24 DIAGNOSIS — M810 Age-related osteoporosis without current pathological fracture: Secondary | ICD-10-CM | POA: Diagnosis not present

## 2021-02-24 DIAGNOSIS — Z86711 Personal history of pulmonary embolism: Secondary | ICD-10-CM | POA: Diagnosis not present

## 2021-02-24 DIAGNOSIS — I119 Hypertensive heart disease without heart failure: Secondary | ICD-10-CM | POA: Diagnosis not present

## 2021-02-24 DIAGNOSIS — Z8616 Personal history of COVID-19: Secondary | ICD-10-CM | POA: Diagnosis not present

## 2021-02-24 DIAGNOSIS — R202 Paresthesia of skin: Secondary | ICD-10-CM | POA: Diagnosis not present

## 2021-02-24 DIAGNOSIS — J309 Allergic rhinitis, unspecified: Secondary | ICD-10-CM | POA: Diagnosis not present

## 2021-02-24 DIAGNOSIS — D649 Anemia, unspecified: Secondary | ICD-10-CM | POA: Diagnosis not present

## 2021-02-24 DIAGNOSIS — Z86718 Personal history of other venous thrombosis and embolism: Secondary | ICD-10-CM | POA: Diagnosis not present

## 2021-02-24 DIAGNOSIS — I82402 Acute embolism and thrombosis of unspecified deep veins of left lower extremity: Secondary | ICD-10-CM | POA: Diagnosis not present

## 2021-02-24 DIAGNOSIS — I2602 Saddle embolus of pulmonary artery with acute cor pulmonale: Secondary | ICD-10-CM | POA: Diagnosis not present

## 2021-02-24 DIAGNOSIS — R933 Abnormal findings on diagnostic imaging of other parts of digestive tract: Secondary | ICD-10-CM | POA: Diagnosis not present

## 2021-02-24 DIAGNOSIS — K219 Gastro-esophageal reflux disease without esophagitis: Secondary | ICD-10-CM | POA: Diagnosis not present

## 2021-02-24 DIAGNOSIS — M6259 Muscle wasting and atrophy, not elsewhere classified, multiple sites: Secondary | ICD-10-CM | POA: Diagnosis not present

## 2021-02-24 DIAGNOSIS — Z96651 Presence of right artificial knee joint: Secondary | ICD-10-CM | POA: Diagnosis not present

## 2021-02-24 DIAGNOSIS — E782 Mixed hyperlipidemia: Secondary | ICD-10-CM | POA: Diagnosis not present

## 2021-02-24 DIAGNOSIS — R195 Other fecal abnormalities: Secondary | ICD-10-CM | POA: Diagnosis not present

## 2021-02-24 DIAGNOSIS — D509 Iron deficiency anemia, unspecified: Secondary | ICD-10-CM | POA: Diagnosis not present

## 2021-02-24 DIAGNOSIS — Z9181 History of falling: Secondary | ICD-10-CM | POA: Diagnosis not present

## 2021-02-25 NOTE — Progress Notes (Signed)
Cardiology Office Note:    Date:  02/26/2021   ID:  Jaime [name], DOB [birth year], MRN [id number]  PCP:  Crist Infante, MD  Cardiologist:  No primary care provider on file.   Referring MD: Crist Infante, MD   Chief Complaint  Patient presents with  . Congestive Heart Failure    History of Present Illness:    Jaime [name] is a 87 y.o. female with a hx of  Elevated troponin I during Covid -19 infection August 2951.,  Acute diastolic CHF, iron deficiency anemia, acute saddle PE, and sarcoidosis.  Jaime [name] feels better.  Lower extremity swelling has improved.  She is wearing compression stockings and using 40 mg alternated with 20 mg of furosemide daily.  Dr. Joylene Draft did laboratory work 3 days ago.  Kidney function was normal with a BUN and creatinine of 16 and 0.8.  Potassium is 4.7.  Her neck veins are still elevated on exam.  She still has 2+ bilateral ankle to knee edema.  Past Medical History:  Diagnosis Date  . Arthritis    knees  . Complication of anesthesia    problem 40 yrs ago Doctor told her anesthesia drugs caused a reaction, BP drops  . Hypertension     Past Surgical History:  Procedure Laterality Date  . ABDOMINAL HYSTERECTOMY    . CATARACT EXTRACTION W/PHACO Left 08/09/2018   Procedure: CATARACT EXTRACTION PHACO AND INTRAOCULAR LENS PLACEMENT ([place])  ISTENT AND ISTENT INJECT LEFT;  Surgeon: Leandrew Koyanagi, MD;  Location: [facility];  Service: Ophthalmology;  Laterality: Left;  . CHOLECYSTECTOMY    . EYE SURGERY     cataract with lens implant-right  . INSERTION OF ANTERIOR SEGMENT AQUEOUS DRAINAGE DEVICE (ISTENT) Left 08/09/2018   Procedure: INSERTION OF ANTERIOR SEGMENT AQUEOUS DRAINAGE DEVICE (ISTENT);  Surgeon: Leandrew Koyanagi, MD;  Location: [facility];  Service: Ophthalmology;  Laterality: Left;  . TOTAL KNEE ARTHROPLASTY Right 02/27/2014   Procedure: RIGHT TOTAL KNEE ARTHROPLASTY;  Surgeon: Tobi Bastos, MD;  Location: [facility];   Service: Orthopedics;  Laterality: Right;  . TUBAL LIGATION      Current Medications: Current Meds  Medication Sig  . albuterol (VENTOLIN HFA) 108 (90 Base) MCG/ACT inhaler Inhale 2 puffs into the lungs every 4 (four) hours as needed for wheezing or shortness of breath.  Marland Kitchen alendronate (FOSAMAX) 70 MG tablet Take 70 mg by mouth once a week. Take with a full glass of water on an empty stomach.  . brimonidine (ALPHAGAN) 0.2 % ophthalmic solution Place 1 drop into the left eye 2 (two) times daily.  Marland Kitchen buPROPion (WELLBUTRIN XL) 150 MG 24 hr tablet Take 150 mg by mouth daily.   . Calcium Carb-Cholecalciferol (CALCIUM 600 + D PO) Take 1 tablet by mouth daily.   . dorzolamide-timolol (COSOPT) 22.3-6.8 MG/ML ophthalmic solution Place 1 drop into both eyes 2 (two) times daily.  . Fe Fum-FePoly-Vit C-Vit B3 (INTEGRA) 62.5-62.5-40-3 MG CAPS Take 1 capsule by mouth daily.  . fluticasone (FLONASE) 50 MCG/ACT nasal spray Place 1 spray into both nostrils daily as needed for allergies or rhinitis.  . furosemide (LASIX) 20 MG tablet Take one tablet (20mg  total) by mouth every other day, alternating with two tablets (40mg  total) by mouth every other day.  Marland Kitchen KLOR-CON M20 20 MEQ tablet Take 10 mEq by mouth daily.  Marland Kitchen latanoprost (XALATAN) 0.005 % ophthalmic solution Place 1 drop into the left eye at bedtime.  . Multiple Vitamins-Minerals (CENTRUM SILVER PO) Take 1  tablet by mouth daily. Gummies  . pantoprazole (PROTONIX) 40 MG tablet Take 40 mg by mouth 2 (two) times daily.  . rosuvastatin (CRESTOR) 10 MG tablet Take 5 mg by mouth daily.  . Vitamin D, Cholecalciferol, 400 units TABS Take 1 tablet by mouth daily.      Allergies:   Codeine, Meperidine hcl, Sulfa antibiotics, Adhesive [tape], and Sulfonamide derivatives   Social History   Socioeconomic History  . Marital status: Single    Spouse name: Not on file  . Number of children: Not on file  . Years of education: Not on file  . Highest education level:  Not on file  Occupational History  . Not on file  Tobacco Use  . Smoking status: Never Smoker  . Smokeless tobacco: Never Used  Vaping Use  . Vaping Use: Never used  Substance and Sexual Activity  . Alcohol use: No  . Drug use: No  . Sexual activity: Not on file  Other Topics Concern  . Not on file  Social History Narrative   ** Merged History Encounter **       Social Determinants of Health   Financial Resource Strain: Not on file  Food Insecurity: Not on file  Transportation Needs: Not on file  Physical Activity: Not on file  Stress: Not on file  Social Connections: Not on file     Family History: The patient's family history includes Heart attack in her father; Ovarian cancer in her mother.  ROS:   Please see the history of present illness.    No new data.  Does not want to be on a high dose of diuretic because she already feels she is urinating too much.  All other systems reviewed and are negative.  EKGs/Labs/Other Studies Reviewed:    The following studies were reviewed today: 2D Doppler echocardiogram January 16, 2021:  patient on left side.  IMPRESSIONS    1. Left ventricular ejection fraction, by estimation, is 60 to 65%. The  left ventricle has normal function. The left ventricle has no regional  wall motion abnormalities. There is mild left ventricular hypertrophy.  Left ventricular diastolic parameters  are consistent with Grade II diastolic dysfunction (pseudonormalization).  2. Right ventricular systolic function is normal. The right ventricular  size is normal. There is moderately elevated pulmonary artery systolic  pressure. The estimated right ventricular systolic pressure is 87.6 mmHg.  3. Left atrial size was moderately dilated.  4. The mitral valve is normal in structure. Mild mitral valve  regurgitation. No evidence of mitral stenosis.  5. The aortic valve is grossly normal. Aortic valve regurgitation is  mild. No aortic stenosis is  present.  6. The inferior vena cava is normal in size with <50% respiratory  variability, suggesting right atrial pressure of 8 mmHg.    EKG:  EKG not performed  Recent Labs: 07/22/2020: B Natriuretic Peptide 340.9 07/27/2020: ALT 18 09/13/2020: Magnesium 2.4 12/30/2020: BUN 20; Creatinine, Ser 0.96; NT-Pro BNP 800; Platelets 241; Potassium 5.2; Sodium 144; TSH 1.350 01/08/2021: Hemoglobin 9.3  Recent Lipid Panel    Component Value Date/Time   TRIG 101 07/21/2020 2013    Physical Exam:    VS:  BP 136/66   Pulse 63   Ht 5\' 5"  (1.651 m)   Wt 185 lb (83.9 kg)   SpO2 99%   BMI 30.79 kg/m     Wt Readings from Last 3 Encounters:  02/26/21 185 lb (83.9 kg)  12/30/20 189 lb 3.2 oz (85.8 kg)  09/13/20 180 lb (81.6 kg)     GEN: Patible with age.. No acute distress HEENT: Normal NECK: While sitting, JVD is distended with marked V wave. LYMPHATICS: No lymphadenopathy CARDIAC: Low pitched systolic left parasternal systolic murmur. RRR no gallop but with bilateral ankle to shin 2+ edema. VASCULAR:  Normal Pulses. No bruits. RESPIRATORY:  Clear to auscultation without rales, wheezing or rhonchi  ABDOMEN: Soft, non-tender, non-distended, No pulsatile mass, MUSCULOSKELETAL: No deformity  SKIN: Warm and dry NEUROLOGIC:  Alert and oriented x 3 PSYCHIATRIC:  Normal affect   ASSESSMENT:    1. Acute on chronic combined systolic and diastolic CHF (congestive heart failure) ([place])   2. Pulmonary hypertension, unspecified ([place])   3. Mixed hyperlipidemia   4. Acute saddle pulmonary embolism with acute cor pulmonale (HCC)   5. Acute hypoxemic respiratory failure due to COVID-19 [facility])    PLAN:    In order of problems listed above:  1. No pulmonary congestion.  Neck veins elevated and there is peripheral edema.  This is all related to significant pulmonary hypertension that is residual effect of bilateral pulmonary emboli. 2. Right heart failure without systolic dysfunction.  Systolic blood  pressure greater than 50 mmHg based on recent echo.  She could be safely diuresed a little harder but she refuses to take a higher dose of furosemide.  We will remain at 40 mg alternated with 20 mg daily.  She feels her legs are much improved.  She denies dyspnea.  Plan as needed follow-up or in 6 months whichever comes first.   Medication Adjustments/Labs and Tests Ordered: Current medicines are reviewed at length with the patient today.  Concerns regarding medicines are outlined above.  No orders of the defined types were placed in this encounter.  No orders of the defined types were placed in this encounter.   There are no Patient Instructions on file for this visit.   Signed, Sinclair Grooms, MD  02/26/2021 4:23 PM    [facility]

## 2021-02-26 ENCOUNTER — Encounter: Payer: Self-pay | Admitting: Interventional Cardiology

## 2021-02-26 ENCOUNTER — Ambulatory Visit (INDEPENDENT_AMBULATORY_CARE_PROVIDER_SITE_OTHER): Payer: Medicare Other | Admitting: Interventional Cardiology

## 2021-02-26 ENCOUNTER — Other Ambulatory Visit: Payer: Self-pay

## 2021-02-26 VITALS — BP 136/66 | HR 63 | Ht 65.0 in | Wt 185.0 lb

## 2021-02-26 DIAGNOSIS — M6259 Muscle wasting and atrophy, not elsewhere classified, multiple sites: Secondary | ICD-10-CM | POA: Diagnosis not present

## 2021-02-26 DIAGNOSIS — U071 COVID-19: Secondary | ICD-10-CM | POA: Diagnosis not present

## 2021-02-26 DIAGNOSIS — J309 Allergic rhinitis, unspecified: Secondary | ICD-10-CM | POA: Diagnosis not present

## 2021-02-26 DIAGNOSIS — I272 Pulmonary hypertension, unspecified: Secondary | ICD-10-CM

## 2021-02-26 DIAGNOSIS — E782 Mixed hyperlipidemia: Secondary | ICD-10-CM | POA: Diagnosis not present

## 2021-02-26 DIAGNOSIS — I5043 Acute on chronic combined systolic (congestive) and diastolic (congestive) heart failure: Secondary | ICD-10-CM | POA: Diagnosis not present

## 2021-02-26 DIAGNOSIS — I2602 Saddle embolus of pulmonary artery with acute cor pulmonale: Secondary | ICD-10-CM | POA: Diagnosis not present

## 2021-02-26 DIAGNOSIS — J9601 Acute respiratory failure with hypoxia: Secondary | ICD-10-CM

## 2021-02-26 DIAGNOSIS — K219 Gastro-esophageal reflux disease without esophagitis: Secondary | ICD-10-CM | POA: Diagnosis not present

## 2021-02-26 DIAGNOSIS — M810 Age-related osteoporosis without current pathological fracture: Secondary | ICD-10-CM | POA: Diagnosis not present

## 2021-02-26 NOTE — Patient Instructions (Signed)
Medication Instructions:  Your physician recommends that you continue on your current medications as directed. Please refer to the Current Medication list given to you today.  *If you need a refill on your cardiac medications before your next appointment, please call your pharmacy*   Lab Work: None If you have labs (blood work) drawn today and your tests are completely normal, you will receive your results only by: Marland Kitchen MyChart Message (if you have MyChart) OR . A paper copy in the mail If you have any lab test that is abnormal or we need to change your treatment, we will call you to review the results.   Testing/Procedures: none   Follow-Up: At [facility], you and your health needs are our priority.  As part of our continuing mission to provide you with exceptional heart care, we have created designated Provider Care Teams.  These Care Teams include your primary Cardiologist (physician) and Advanced Practice Providers (APPs -  Physician Assistants and Nurse Practitioners) who all work together to provide you with the care you need, when you need it.  We recommend signing up for the patient portal called "MyChart".  Sign up information is provided on this After Visit Summary.  MyChart is used to connect with patients for Virtual Visits (Telemedicine).  Patients are able to view lab/test results, encounter notes, upcoming appointments, etc.  Non-urgent messages can be sent to your provider as well.   To learn more about what you can do with MyChart, go to [email or web address].    Your next appointment:   6 month(s)  The format for your next appointment:   In Person  Provider:   You may see Dr. Daneen Schick or one of the following Advanced Practice Providers on your designated Care Team:    Kathyrn Drown, NP    Other Instructions

## 2021-02-27 ENCOUNTER — Other Ambulatory Visit: Payer: Medicare Other

## 2021-02-27 DIAGNOSIS — M6259 Muscle wasting and atrophy, not elsewhere classified, multiple sites: Secondary | ICD-10-CM | POA: Diagnosis not present

## 2021-02-27 DIAGNOSIS — K219 Gastro-esophageal reflux disease without esophagitis: Secondary | ICD-10-CM | POA: Diagnosis not present

## 2021-02-27 DIAGNOSIS — J309 Allergic rhinitis, unspecified: Secondary | ICD-10-CM | POA: Diagnosis not present

## 2021-02-27 DIAGNOSIS — E782 Mixed hyperlipidemia: Secondary | ICD-10-CM | POA: Diagnosis not present

## 2021-02-27 DIAGNOSIS — I2602 Saddle embolus of pulmonary artery with acute cor pulmonale: Secondary | ICD-10-CM | POA: Diagnosis not present

## 2021-02-27 DIAGNOSIS — M810 Age-related osteoporosis without current pathological fracture: Secondary | ICD-10-CM | POA: Diagnosis not present

## 2021-03-06 DIAGNOSIS — M6259 Muscle wasting and atrophy, not elsewhere classified, multiple sites: Secondary | ICD-10-CM | POA: Diagnosis not present

## 2021-03-06 DIAGNOSIS — M810 Age-related osteoporosis without current pathological fracture: Secondary | ICD-10-CM | POA: Diagnosis not present

## 2021-03-06 DIAGNOSIS — J309 Allergic rhinitis, unspecified: Secondary | ICD-10-CM | POA: Diagnosis not present

## 2021-03-06 DIAGNOSIS — E782 Mixed hyperlipidemia: Secondary | ICD-10-CM | POA: Diagnosis not present

## 2021-03-06 DIAGNOSIS — I2602 Saddle embolus of pulmonary artery with acute cor pulmonale: Secondary | ICD-10-CM | POA: Diagnosis not present

## 2021-03-06 DIAGNOSIS — K219 Gastro-esophageal reflux disease without esophagitis: Secondary | ICD-10-CM | POA: Diagnosis not present

## 2021-03-11 DIAGNOSIS — M810 Age-related osteoporosis without current pathological fracture: Secondary | ICD-10-CM | POA: Diagnosis not present

## 2021-03-11 DIAGNOSIS — J309 Allergic rhinitis, unspecified: Secondary | ICD-10-CM | POA: Diagnosis not present

## 2021-03-11 DIAGNOSIS — M6259 Muscle wasting and atrophy, not elsewhere classified, multiple sites: Secondary | ICD-10-CM | POA: Diagnosis not present

## 2021-03-11 DIAGNOSIS — K219 Gastro-esophageal reflux disease without esophagitis: Secondary | ICD-10-CM | POA: Diagnosis not present

## 2021-03-11 DIAGNOSIS — E782 Mixed hyperlipidemia: Secondary | ICD-10-CM | POA: Diagnosis not present

## 2021-03-11 DIAGNOSIS — I2602 Saddle embolus of pulmonary artery with acute cor pulmonale: Secondary | ICD-10-CM | POA: Diagnosis not present

## 2021-03-12 DIAGNOSIS — J309 Allergic rhinitis, unspecified: Secondary | ICD-10-CM | POA: Diagnosis not present

## 2021-03-12 DIAGNOSIS — I2602 Saddle embolus of pulmonary artery with acute cor pulmonale: Secondary | ICD-10-CM | POA: Diagnosis not present

## 2021-03-12 DIAGNOSIS — K219 Gastro-esophageal reflux disease without esophagitis: Secondary | ICD-10-CM | POA: Diagnosis not present

## 2021-03-12 DIAGNOSIS — M810 Age-related osteoporosis without current pathological fracture: Secondary | ICD-10-CM | POA: Diagnosis not present

## 2021-03-12 DIAGNOSIS — E782 Mixed hyperlipidemia: Secondary | ICD-10-CM | POA: Diagnosis not present

## 2021-03-12 DIAGNOSIS — M6259 Muscle wasting and atrophy, not elsewhere classified, multiple sites: Secondary | ICD-10-CM | POA: Diagnosis not present

## 2021-03-13 ENCOUNTER — Encounter (HOSPITAL_COMMUNITY): Payer: Medicare Other

## 2021-03-18 DIAGNOSIS — M6259 Muscle wasting and atrophy, not elsewhere classified, multiple sites: Secondary | ICD-10-CM | POA: Diagnosis not present

## 2021-03-18 DIAGNOSIS — K219 Gastro-esophageal reflux disease without esophagitis: Secondary | ICD-10-CM | POA: Diagnosis not present

## 2021-03-18 DIAGNOSIS — M810 Age-related osteoporosis without current pathological fracture: Secondary | ICD-10-CM | POA: Diagnosis not present

## 2021-03-18 DIAGNOSIS — J309 Allergic rhinitis, unspecified: Secondary | ICD-10-CM | POA: Diagnosis not present

## 2021-03-18 DIAGNOSIS — I2602 Saddle embolus of pulmonary artery with acute cor pulmonale: Secondary | ICD-10-CM | POA: Diagnosis not present

## 2021-03-18 DIAGNOSIS — E782 Mixed hyperlipidemia: Secondary | ICD-10-CM | POA: Diagnosis not present

## 2021-03-19 ENCOUNTER — Other Ambulatory Visit (HOSPITAL_COMMUNITY): Payer: Self-pay | Admitting: *Deleted

## 2021-03-19 NOTE — Discharge Instructions (Signed)
Ferumoxytol injection What is this medicine? FERUMOXYTOL is an iron complex. Iron is used to make healthy red blood cells, which carry oxygen and nutrients throughout the body. This medicine is used to treat iron deficiency anemia. This medicine may be used for other purposes; ask your health care provider or pharmacist if you have questions. COMMON BRAND NAME(S): Feraheme What should I tell my health care provider before I take this medicine? They need to know if you have any of these conditions:  anemia not caused by low iron levels  high levels of iron in the blood  magnetic resonance imaging (MRI) test scheduled  an unusual or allergic reaction to iron, other medicines, foods, dyes, or preservatives  pregnant or trying to get pregnant  breast-feeding How should I use this medicine? This medicine is for injection into a vein. It is given by a health care professional in a hospital or clinic setting. Talk to your pediatrician regarding the use of this medicine in children. Special care may be needed. Overdosage: If you think you have taken too much of this medicine contact a poison control center or emergency room at once. NOTE: This medicine is only for you. Do not share this medicine with others. What if I miss a dose? It is important not to miss your dose. Call your doctor or health care professional if you are unable to keep an appointment. What may interact with this medicine? This medicine may interact with the following medications:  other iron products This list may not describe all possible interactions. Give your health care provider a list of all the medicines, herbs, non-prescription drugs, or dietary supplements you use. Also tell them if you smoke, drink alcohol, or use illegal drugs. Some items may interact with your medicine. What should I watch for while using this medicine? Visit your doctor or healthcare professional regularly. Tell your doctor or healthcare  professional if your symptoms do not start to get better or if they get worse. You may need blood work done while you are taking this medicine. You may need to follow a special diet. Talk to your doctor. Foods that contain iron include: whole grains/cereals, dried fruits, beans, or peas, leafy green vegetables, and organ meats (liver, kidney). What side effects may I notice from receiving this medicine? Side effects that you should report to your doctor or health care professional as soon as possible:  allergic reactions like skin rash, itching or hives, swelling of the face, lips, or tongue  breathing problems  changes in blood pressure  feeling faint or lightheaded, falls  fever or chills  flushing, sweating, or hot feelings  swelling of the ankles or feet Side effects that usually do not require medical attention (report to your doctor or health care professional if they continue or are bothersome):  diarrhea  headache  nausea, vomiting  stomach pain This list may not describe all possible side effects. Call your doctor for medical advice about side effects. You may report side effects to FDA at 1-800-FDA-1088. Where should I keep my medicine? This drug is given in a hospital or clinic and will not be stored at home. NOTE: This sheet is a summary. It may not cover all possible information. If you have questions about this medicine, talk to your doctor, pharmacist, or health care provider.  2021 Elsevier/Gold Standard (2016-12-27 20:21:10)  

## 2021-03-20 ENCOUNTER — Other Ambulatory Visit: Payer: Self-pay

## 2021-03-20 ENCOUNTER — Ambulatory Visit (HOSPITAL_COMMUNITY)
Admission: RE | Admit: 2021-03-20 | Discharge: 2021-03-20 | Disposition: A | Discharge status: Home / Self Care | Payer: Medicare Other | Source: Ambulatory Visit | Attending: Internal Medicine | Admitting: Internal Medicine

## 2021-03-20 DIAGNOSIS — D649 Anemia, unspecified: Secondary | ICD-10-CM | POA: Insufficient documentation

## 2021-03-20 MED ORDER — SODIUM CHLORIDE 0.9 % IV SOLN
510.0000 mg | INTRAVENOUS | Status: DC
  Administered 2021-03-20: 510 mg via INTRAVENOUS
  Filled 2021-03-20: qty 510

## 2021-03-24 DIAGNOSIS — E782 Mixed hyperlipidemia: Secondary | ICD-10-CM | POA: Diagnosis not present

## 2021-03-24 DIAGNOSIS — M6259 Muscle wasting and atrophy, not elsewhere classified, multiple sites: Secondary | ICD-10-CM | POA: Diagnosis not present

## 2021-03-24 DIAGNOSIS — I2602 Saddle embolus of pulmonary artery with acute cor pulmonale: Secondary | ICD-10-CM | POA: Diagnosis not present

## 2021-03-24 DIAGNOSIS — J309 Allergic rhinitis, unspecified: Secondary | ICD-10-CM | POA: Diagnosis not present

## 2021-03-24 DIAGNOSIS — M810 Age-related osteoporosis without current pathological fracture: Secondary | ICD-10-CM | POA: Diagnosis not present

## 2021-03-24 DIAGNOSIS — K219 Gastro-esophageal reflux disease without esophagitis: Secondary | ICD-10-CM | POA: Diagnosis not present

## 2021-03-26 DIAGNOSIS — I82402 Acute embolism and thrombosis of unspecified deep veins of left lower extremity: Secondary | ICD-10-CM | POA: Diagnosis not present

## 2021-03-26 DIAGNOSIS — I119 Hypertensive heart disease without heart failure: Secondary | ICD-10-CM | POA: Diagnosis not present

## 2021-03-26 DIAGNOSIS — J309 Allergic rhinitis, unspecified: Secondary | ICD-10-CM | POA: Diagnosis not present

## 2021-03-26 DIAGNOSIS — Z8616 Personal history of COVID-19: Secondary | ICD-10-CM | POA: Diagnosis not present

## 2021-03-26 DIAGNOSIS — R911 Solitary pulmonary nodule: Secondary | ICD-10-CM | POA: Diagnosis not present

## 2021-03-26 DIAGNOSIS — M1991 Primary osteoarthritis, unspecified site: Secondary | ICD-10-CM | POA: Diagnosis not present

## 2021-03-26 DIAGNOSIS — E782 Mixed hyperlipidemia: Secondary | ICD-10-CM | POA: Diagnosis not present

## 2021-03-26 DIAGNOSIS — I2602 Saddle embolus of pulmonary artery with acute cor pulmonale: Secondary | ICD-10-CM | POA: Diagnosis not present

## 2021-03-26 DIAGNOSIS — Z9181 History of falling: Secondary | ICD-10-CM | POA: Diagnosis not present

## 2021-03-26 DIAGNOSIS — M810 Age-related osteoporosis without current pathological fracture: Secondary | ICD-10-CM | POA: Diagnosis not present

## 2021-03-26 DIAGNOSIS — Z96651 Presence of right artificial knee joint: Secondary | ICD-10-CM | POA: Diagnosis not present

## 2021-03-26 DIAGNOSIS — H4010X0 Unspecified open-angle glaucoma, stage unspecified: Secondary | ICD-10-CM | POA: Diagnosis not present

## 2021-03-26 DIAGNOSIS — Z7901 Long term (current) use of anticoagulants: Secondary | ICD-10-CM | POA: Diagnosis not present

## 2021-03-26 DIAGNOSIS — M6259 Muscle wasting and atrophy, not elsewhere classified, multiple sites: Secondary | ICD-10-CM | POA: Diagnosis not present

## 2021-03-26 DIAGNOSIS — K219 Gastro-esophageal reflux disease without esophagitis: Secondary | ICD-10-CM | POA: Diagnosis not present

## 2021-03-27 ENCOUNTER — Other Ambulatory Visit: Payer: Self-pay

## 2021-03-27 ENCOUNTER — Ambulatory Visit (HOSPITAL_COMMUNITY)
Admission: RE | Admit: 2021-03-27 | Discharge: 2021-03-27 | Disposition: A | Discharge status: Home / Self Care | Payer: Medicare Other | Source: Ambulatory Visit | Attending: Internal Medicine | Admitting: Internal Medicine

## 2021-03-27 DIAGNOSIS — D649 Anemia, unspecified: Secondary | ICD-10-CM | POA: Insufficient documentation

## 2021-03-27 MED ORDER — SODIUM CHLORIDE 0.9 % IV SOLN
510.0000 mg | INTRAVENOUS | Status: DC
  Administered 2021-03-27: 510 mg via INTRAVENOUS
  Filled 2021-03-27: qty 17

## 2021-04-09 DIAGNOSIS — E782 Mixed hyperlipidemia: Secondary | ICD-10-CM | POA: Diagnosis not present

## 2021-04-09 DIAGNOSIS — K219 Gastro-esophageal reflux disease without esophagitis: Secondary | ICD-10-CM | POA: Diagnosis not present

## 2021-04-09 DIAGNOSIS — I2602 Saddle embolus of pulmonary artery with acute cor pulmonale: Secondary | ICD-10-CM | POA: Diagnosis not present

## 2021-04-09 DIAGNOSIS — J309 Allergic rhinitis, unspecified: Secondary | ICD-10-CM | POA: Diagnosis not present

## 2021-04-09 DIAGNOSIS — M6259 Muscle wasting and atrophy, not elsewhere classified, multiple sites: Secondary | ICD-10-CM | POA: Diagnosis not present

## 2021-04-09 DIAGNOSIS — M810 Age-related osteoporosis without current pathological fracture: Secondary | ICD-10-CM | POA: Diagnosis not present

## 2021-04-13 ENCOUNTER — Other Ambulatory Visit: Payer: Self-pay | Admitting: Gastroenterology

## 2021-04-13 DIAGNOSIS — R9389 Abnormal findings on diagnostic imaging of other specified body structures: Secondary | ICD-10-CM

## 2021-04-21 DIAGNOSIS — J309 Allergic rhinitis, unspecified: Secondary | ICD-10-CM | POA: Diagnosis not present

## 2021-04-21 DIAGNOSIS — I2602 Saddle embolus of pulmonary artery with acute cor pulmonale: Secondary | ICD-10-CM | POA: Diagnosis not present

## 2021-04-21 DIAGNOSIS — E782 Mixed hyperlipidemia: Secondary | ICD-10-CM | POA: Diagnosis not present

## 2021-04-21 DIAGNOSIS — M6259 Muscle wasting and atrophy, not elsewhere classified, multiple sites: Secondary | ICD-10-CM | POA: Diagnosis not present

## 2021-04-21 DIAGNOSIS — M810 Age-related osteoporosis without current pathological fracture: Secondary | ICD-10-CM | POA: Diagnosis not present

## 2021-04-21 DIAGNOSIS — K219 Gastro-esophageal reflux disease without esophagitis: Secondary | ICD-10-CM | POA: Diagnosis not present

## 2021-04-22 DIAGNOSIS — R911 Solitary pulmonary nodule: Secondary | ICD-10-CM | POA: Diagnosis not present

## 2021-04-22 DIAGNOSIS — M8580 Other specified disorders of bone density and structure, unspecified site: Secondary | ICD-10-CM | POA: Diagnosis not present

## 2021-04-22 DIAGNOSIS — D649 Anemia, unspecified: Secondary | ICD-10-CM | POA: Diagnosis not present

## 2021-04-22 DIAGNOSIS — R269 Unspecified abnormalities of gait and mobility: Secondary | ICD-10-CM | POA: Diagnosis not present

## 2021-04-22 DIAGNOSIS — E785 Hyperlipidemia, unspecified: Secondary | ICD-10-CM | POA: Diagnosis not present

## 2021-04-22 DIAGNOSIS — M179 Osteoarthritis of knee, unspecified: Secondary | ICD-10-CM | POA: Diagnosis not present

## 2021-04-22 DIAGNOSIS — N318 Other neuromuscular dysfunction of bladder: Secondary | ICD-10-CM | POA: Diagnosis not present

## 2021-04-22 DIAGNOSIS — R030 Elevated blood-pressure reading, without diagnosis of hypertension: Secondary | ICD-10-CM | POA: Diagnosis not present

## 2021-04-22 DIAGNOSIS — D860 Sarcoidosis of lung: Secondary | ICD-10-CM | POA: Diagnosis not present

## 2021-04-22 DIAGNOSIS — J90 Pleural effusion, not elsewhere classified: Secondary | ICD-10-CM | POA: Diagnosis not present

## 2021-04-22 DIAGNOSIS — K921 Melena: Secondary | ICD-10-CM | POA: Diagnosis not present

## 2021-04-22 DIAGNOSIS — R413 Other amnesia: Secondary | ICD-10-CM | POA: Diagnosis not present

## 2021-04-24 DIAGNOSIS — H401123 Primary open-angle glaucoma, left eye, severe stage: Secondary | ICD-10-CM | POA: Diagnosis not present

## 2021-04-30 ENCOUNTER — Ambulatory Visit
Admission: RE | Admit: 2021-04-30 | Discharge: 2021-04-30 | Disposition: A | Discharge status: Home / Self Care | Payer: Medicare Other | Source: Ambulatory Visit | Attending: Gastroenterology | Admitting: Gastroenterology

## 2021-04-30 DIAGNOSIS — K921 Melena: Secondary | ICD-10-CM | POA: Diagnosis not present

## 2021-04-30 DIAGNOSIS — K839 Disease of biliary tract, unspecified: Secondary | ICD-10-CM | POA: Diagnosis not present

## 2021-04-30 DIAGNOSIS — R9389 Abnormal findings on diagnostic imaging of other specified body structures: Secondary | ICD-10-CM

## 2021-04-30 MED ORDER — IOPAMIDOL (ISOVUE-300) INJECTION 61%
100.0000 mL | Freq: Once | INTRAVENOUS | Status: AC | PRN
  Administered 2021-04-30: 100 mL via INTRAVENOUS

## 2021-05-01 ENCOUNTER — Other Ambulatory Visit (HOSPITAL_COMMUNITY): Payer: Self-pay | Admitting: Gastroenterology

## 2021-05-01 ENCOUNTER — Encounter (HOSPITAL_COMMUNITY): Payer: Self-pay | Admitting: Radiology

## 2021-05-01 DIAGNOSIS — C189 Malignant neoplasm of colon, unspecified: Secondary | ICD-10-CM

## 2021-05-01 DIAGNOSIS — R9389 Abnormal findings on diagnostic imaging of other specified body structures: Secondary | ICD-10-CM

## 2021-05-01 NOTE — Progress Notes (Signed)
Patient Name  Jaime [name], Jaime [name] Legal Sex  Female DOB  [birth year] SSN  [id number] Address  [address]  [place] [address] Phone  [phone] [facility])  [phone] (Mobile)     RE: CT Liver Biopsy Received: Today Suttle, Rosanne Ashing, MD  Garth Bigness D Approved for ultrasound guided liver lesion biopsy with contrast enhanced ultrasound.   Dylan         Previous Messages    ----- Message -----  From: Garth Bigness D  Sent: 05/01/2021   2:07 PM EDT  To: [facility] Procedure Requests  Subject: CT Liver Biopsy                                 Procedure:   CT Liver Biopsy   Reason:  Malignant neoplasm of colon, unspecified part of colon,  Abnormal CT scan, dx colon cancer and CT showed liver lesions   History:  CT in computer   Provider:  Otis Brace   Provider Contact:  [phone]

## 2021-05-05 ENCOUNTER — Other Ambulatory Visit: Payer: Self-pay | Admitting: Radiology

## 2021-05-05 NOTE — Progress Notes (Signed)
Left voice message for referral coordinator at [facility] in attempt to obtain any colonoscopy and pathology reports on this patient.

## 2021-05-05 NOTE — Progress Notes (Signed)
Received a call back from [employer] at [facility] and there are no recent colonoscopies and no pathology reports.  The referral is based off the CT scans only.   Spoke to patient's daughter Baker Janus regarding referral we received from Dr. Alessandra Bevels at [facility] for presumed metastatic colon cancer.  Patient is scheduled for liver biopsy on 05/07/2021.  Per patient's daughter she is frail and her father passed away last 07-30-23.  They had been married for 13 years.  I have scheduled her to be seen by medical oncology on Wednesday 05/13/2021 to arrive by 10:45 am. She will be seen by Cira Rue NP and Dr. Truitt Merle.  She is aware of our location, that [place] parking is available and that one person can come with her to the consult.

## 2021-05-06 ENCOUNTER — Other Ambulatory Visit: Payer: Self-pay | Admitting: Radiology

## 2021-05-07 ENCOUNTER — Other Ambulatory Visit (HOSPITAL_COMMUNITY): Payer: Self-pay | Admitting: Gastroenterology

## 2021-05-07 ENCOUNTER — Other Ambulatory Visit: Payer: Self-pay

## 2021-05-07 ENCOUNTER — Ambulatory Visit (HOSPITAL_COMMUNITY)
Admission: RE | Admit: 2021-05-07 | Discharge: 2021-05-07 | Disposition: A | Discharge status: Home / Self Care | Payer: Medicare Other | Source: Ambulatory Visit | Attending: Gastroenterology | Admitting: Gastroenterology

## 2021-05-07 DIAGNOSIS — R9389 Abnormal findings on diagnostic imaging of other specified body structures: Secondary | ICD-10-CM

## 2021-05-07 DIAGNOSIS — C787 Secondary malignant neoplasm of liver and intrahepatic bile duct: Secondary | ICD-10-CM | POA: Diagnosis not present

## 2021-05-07 DIAGNOSIS — J90 Pleural effusion, not elsewhere classified: Secondary | ICD-10-CM | POA: Insufficient documentation

## 2021-05-07 DIAGNOSIS — C229 Malignant neoplasm of liver, not specified as primary or secondary: Secondary | ICD-10-CM | POA: Diagnosis not present

## 2021-05-07 DIAGNOSIS — I700 Atherosclerosis of aorta: Secondary | ICD-10-CM | POA: Insufficient documentation

## 2021-05-07 DIAGNOSIS — C189 Malignant neoplasm of colon, unspecified: Secondary | ICD-10-CM

## 2021-05-07 DIAGNOSIS — K769 Liver disease, unspecified: Secondary | ICD-10-CM | POA: Diagnosis not present

## 2021-05-07 DIAGNOSIS — R160 Hepatomegaly, not elsewhere classified: Secondary | ICD-10-CM | POA: Diagnosis not present

## 2021-05-07 LAB — CBC WITH DIFFERENTIAL/PLATELET
Abs Immature Granulocytes: 0.03 10*3/uL (ref 0.00–0.07)
Basophils Absolute: 0 10*3/uL (ref 0.0–0.1)
Basophils Relative: 0 %
Eosinophils Absolute: 0.1 10*3/uL (ref 0.0–0.5)
Eosinophils Relative: 1 %
HCT: 36.9 % (ref 36.0–46.0)
Hemoglobin: 11.5 g/dL — ABNORMAL LOW (ref 12.0–15.0)
Immature Granulocytes: 0 %
Lymphocytes Relative: 27 %
Lymphs Abs: 2.4 10*3/uL (ref 0.7–4.0)
MCH: 28 pg (ref 26.0–34.0)
MCHC: 31.2 g/dL (ref 30.0–36.0)
MCV: 90 fL (ref 80.0–100.0)
Monocytes Absolute: 1 10*3/uL (ref 0.1–1.0)
Monocytes Relative: 12 %
Neutro Abs: 5.3 10*3/uL (ref 1.7–7.7)
Neutrophils Relative %: 60 %
Platelets: 290 10*3/uL (ref 150–400)
RBC: 4.1 MIL/uL (ref 3.87–5.11)
RDW: 17.2 % — ABNORMAL HIGH (ref 11.5–15.5)
WBC: 8.9 10*3/uL (ref 4.0–10.5)
nRBC: 0 % (ref 0.0–0.2)

## 2021-05-07 LAB — PROTIME-INR
INR: 1.1 (ref 0.8–1.2)
Prothrombin Time: 13.9 seconds (ref 11.4–15.2)

## 2021-05-07 MED ORDER — MIDAZOLAM HCL 2 MG/2ML IJ SOLN
INTRAMUSCULAR | Status: AC | PRN
  Administered 2021-05-07: 0.5 mg via INTRAVENOUS
  Administered 2021-05-07: 1 mg via INTRAVENOUS
  Administered 2021-05-07: 0.5 mg via INTRAVENOUS

## 2021-05-07 MED ORDER — SULFUR HEXAFLUORIDE MICROSPH 60.7-25 MG IJ SUSR
INTRAMUSCULAR | Status: AC
  Filled 2021-05-07: qty 5

## 2021-05-07 MED ORDER — FENTANYL CITRATE (PF) 100 MCG/2ML IJ SOLN
INTRAMUSCULAR | Status: AC
  Filled 2021-05-07: qty 2

## 2021-05-07 MED ORDER — FENTANYL CITRATE (PF) 100 MCG/2ML IJ SOLN
INTRAMUSCULAR | Status: AC | PRN
  Administered 2021-05-07 (×6): 25 ug via INTRAVENOUS

## 2021-05-07 MED ORDER — MIDAZOLAM HCL 2 MG/2ML IJ SOLN
INTRAMUSCULAR | Status: AC
  Filled 2021-05-07: qty 2

## 2021-05-07 MED ORDER — LIDOCAINE HCL (PF) 1 % IJ SOLN
INTRAMUSCULAR | Status: AC
  Filled 2021-05-07: qty 30

## 2021-05-07 MED ORDER — SODIUM CHLORIDE 0.9 % IV SOLN: INTRAVENOUS | Status: DC

## 2021-05-07 NOTE — Consult Note (Signed)
Chief Complaint: Patient was seen in consultation today for image guided liver lesion biopsy  Referring Physician(s): [id number]  Supervising Physician: Mir, Sharen Heck  Patient Status:   History of Present Illness: Jaime [name] is an 87 y.o. female with past medical history of arthritis, hypertension, CHF, prior hysterectomy and cholecystectomy and COVID-19 in 2021 who now presents with history of weight loss, diminished appetite ,bloody stools and imaging findings of : 1. Progressive infiltrative mass involving the cecum compared with previous CT 3 months ago, consistent with progressive colon cancer. 2. Interval development of several hypoattenuating liver lesions consistent with metastatic disease. No abdominal adenopathy or signs of peritoneal carcinomatosis. 3. Progressive intra and extrahepatic biliary dilatation of undetermined etiology. Given change from previous recent CT, correlation with liver function studies recommended. 4. Stable right pleural effusion and right basilar pulmonary opacity from recent prior studies. Of note, a right upper lobe pulmonary nodule was demonstrated on chest CT 11/20/2020 for which chest CT follow-up is recommended. That is potentially a metastasis. 5.  Aortic Atherosclerosis  She is scheduled today for image guided liver lesion biopsy for further evaluation. Past Medical History:  Diagnosis Date   Arthritis    knees   Complication of anesthesia    problem 40 yrs ago Doctor told her anesthesia drugs caused a reaction, BP drops   Hypertension     Past Surgical History:  Procedure Laterality Date   ABDOMINAL HYSTERECTOMY     CATARACT EXTRACTION W/PHACO Left 08/09/2018   Procedure: CATARACT EXTRACTION PHACO AND INTRAOCULAR LENS PLACEMENT ([place])  ISTENT AND ISTENT INJECT LEFT;  Surgeon: Leandrew Koyanagi, MD;  Location: [facility];  Service: Ophthalmology;  Laterality: Left;   CHOLECYSTECTOMY     EYE SURGERY      cataract with lens implant-right   INSERTION OF ANTERIOR SEGMENT AQUEOUS DRAINAGE DEVICE (ISTENT) Left 08/09/2018   Procedure: INSERTION OF ANTERIOR SEGMENT AQUEOUS DRAINAGE DEVICE (ISTENT);  Surgeon: Leandrew Koyanagi, MD;  Location: [facility];  Service: Ophthalmology;  Laterality: Left;   TOTAL KNEE ARTHROPLASTY Right 02/27/2014   Procedure: RIGHT TOTAL KNEE ARTHROPLASTY;  Surgeon: Tobi Bastos, MD;  Location: [facility];  Service: Orthopedics;  Laterality: Right;   TUBAL LIGATION      Allergies: Cefdinir, Codeine, Ibandronic acid, Meperidine hcl, Adhesive [tape], Sulfa antibiotics, and Sulfonamide derivatives  Medications: Prior to Admission medications   Medication Sig Start Date End Date Taking? Authorizing Provider  acetaminophen (TYLENOL) 500 MG tablet Take 500 mg by mouth every 6 (six) hours as needed for moderate pain or headache.   Yes [provider]  brimonidine (ALPHAGAN) 0.2 % ophthalmic solution Place 1 drop into the left eye 2 (two) times daily. 07/29/20  Yes [provider]  buPROPion (WELLBUTRIN XL) 150 MG 24 hr tablet Take 150 mg by mouth daily.  05/06/20  Yes [provider]  cholecalciferol (VITAMIN D3) 25 MCG (1000 UNIT) tablet Take 1,000 Units by mouth daily.   Yes [provider]  dorzolamide-timolol (COSOPT) 22.3-6.8 MG/ML ophthalmic solution Place 1 drop into both eyes 2 (two) times daily.   Yes [provider]  escitalopram (LEXAPRO) 5 MG tablet Take 5 mg by mouth daily.   Yes [provider]  Fe Fum-FePoly-Vit C-Vit B3 (INTEGRA) 62.5-62.5-40-3 MG CAPS Take 1 capsule by mouth daily. 12/12/20  Yes [provider]  fluticasone (FLONASE) 50 MCG/ACT nasal spray Place 1 spray into both nostrils daily as needed for allergies or rhinitis.   Yes [provider]  furosemide (  LASIX) 20 MG tablet Take one tablet (20mg  total) by mouth every other day, alternating with two tablets (40mg  total) by mouth  every other day. Patient taking differently: Take 20-40 mg by mouth See admin instructions. Take one tablet (20mg  total) by mouth every other day, alternating with two tablets (40mg  total) by mouth every other day. 01/21/21  Yes Belva Crome, MD  Guaifenesin [facility] MAXIMUM STRENGTH) 1200 MG TB12 Take 1,200 mg by mouth 2 (two) times daily as needed (congestion).   Yes [provider]  KLOR-CON M20 20 MEQ tablet Take 10 mEq by mouth every Monday, Wednesday, and Friday. 11/19/20  Yes [provider]  latanoprost (XALATAN) 0.005 % ophthalmic solution Place 1 drop into both eyes at bedtime. 09/13/20  Yes [provider]  loperamide (IMODIUM A-D) 2 MG tablet Take 2-4 mg by mouth 4 (four) times daily as needed for diarrhea or loose stools.   Yes [provider]  Multiple Vitamins-Minerals (ADULT GUMMY PO) Take 2 capsules by mouth daily.   Yes [provider]  pantoprazole (PROTONIX) 40 MG tablet Take 40 mg by mouth 2 (two) times daily. 11/19/20  Yes [provider]  rosuvastatin (CRESTOR) 10 MG tablet Take 10 mg by mouth daily.   Yes [provider]  vitamin C (ASCORBIC ACID) 500 MG tablet Take 500 mg by mouth daily.   Yes [provider]  albuterol (VENTOLIN HFA) 108 (90 Base) MCG/ACT inhaler Inhale 2 puffs into the lungs every 4 (four) hours as needed for wheezing or shortness of breath. 07/29/20   Barb Merino, MD  fexofenadine (ALLEGRA) 180 MG tablet Take 180 mg by mouth daily as needed for allergies or rhinitis.    [provider]     Family History  Problem Relation Age of Onset   Ovarian cancer Mother    Heart attack Father     Social History   Socioeconomic History   Marital status: Widowed    Spouse name: Not on file   Number of children: Not on file   Years of education: Not on file   Highest education level: Not on file  Occupational History   Not on file  Tobacco Use   Smoking status: Never    Smokeless tobacco: Never  Vaping Use   Vaping Use: Never used  Substance and Sexual Activity   Alcohol use: No   Drug use: No   Sexual activity: Not on file  Other Topics Concern   Not on file  Social History Narrative   ** Merged History Encounter **       Social Determinants of Health   Financial Resource Strain: Not on file  Food Insecurity: Not on file  Transportation Needs: Not on file  Physical Activity: Not on file  Stress: Not on file  Social Connections: Not on file      Review of Systems currently denies fever, headache, chest pain,  cough, abdominal/back pain, nausea, vomiting; has had weight loss and some dyspnea with exertion, decreased appetite  Vital Signs: BP (!) 187/61   Pulse 67   Temp 99 F (37.2 C) (Oral)   Ht 5\' 5"  (1.651 m)   Wt 185 lb (83.9 kg)   SpO2 99%   BMI 30.79 kg/m   Physical Exam awake, alert.  Chest with diminished breath sounds bases.  Heart with regular rate /rhythm.  Abdomen soft, positive bowel sounds, currently nontender.  Bilateral lower extremity edema noted  Imaging: CT ABDOMEN PELVIS W CONTRAST  Result Date: 05/01/2021 CLINICAL DATA:  Follow up abnormal CT with cecal wall thickening suspicious for neoplasm. Blood in stool for 3 months. Previous hysterectomy and cholecystectomy. Creatinine was obtained on site at [facility] at [address] Results: Creatinine 0.9 mg/dL. EXAM: CT ABDOMEN AND PELVIS WITH CONTRAST TECHNIQUE: Multidetector CT imaging of the abdomen and pelvis was performed using the standard protocol following bolus administration of intravenous contrast. CONTRAST:  113mL ISOVUE-300 IOPAMIDOL (ISOVUE-300) INJECTION 61% COMPARISON:  Abdominopelvic CT 02/12/2021 and chest CT 11/20/2020. FINDINGS: Lower chest: Dependent right-greater-than-left pleural effusions and focal right lower lobe consolidation are unchanged from the recent prior studies. There is underlying mild centrilobular and paraseptal emphysema.  Aortic atherosclerosis noted. Hepatobiliary: Interval development of multiple hypoattenuating liver lesions consistent with metastatic disease. Largest lesion is located in the dome of the right hepatic lobe (segment 8), measuring 1.3 cm on image 11/2. Additional lesions include a 0.8 cm lesion in the left lobe (image 15/2) and lesions in the right lobe measuring 0.8 and 0.8 cm (image 17/2) and 0.8 cm (image 20/2). Status post cholecystectomy with interval progression in intra and extrahepatic biliary dilatation. The common hepatic duct measures up to 1.6 cm in diameter, although tapers distally. No evidence of choledocholithiasis. Pancreas: No evidence of pancreatic or ampullary mass. There is no pancreatic ductal dilatation or surrounding inflammation. Spleen: Normal in size without focal abnormality. Adrenals/Urinary Tract: Both adrenal glands appear normal. The kidneys appear normal without evidence of urinary tract calculus, suspicious lesion or hydronephrosis. No bladder abnormalities are seen. Stomach/Bowel: Enteric contrast was administered and has passed into the mid colon. The stomach is decompressed. No small bowel abnormalities are identified. There is mildly progressive circumferential wall thickening in the cecum which remains highly worrisome for infiltrating colon cancer. No other colonic lesions are identified. There is no evidence of bowel obstruction or surrounding inflammation. Moderate diverticular changes are present within the sigmoid colon. Vascular/Lymphatic: There are no enlarged abdominal or pelvic lymph nodes. Aortic and branch vessel atherosclerosis without acute vascular findings. The portal, superior mesenteric and splenic veins are patent. Reproductive: Hysterectomy.  No adnexal mass. Other: No ascites or peritoneal nodularity. Musculoskeletal: No acute or significant osseous findings. Stable chronic L4 compression fracture. Mild lower lumbar spondylosis with probable interbody  ankylosis at L5-S1. IMPRESSION: 1. Progressive infiltrative mass involving the cecum compared with previous CT 3 months ago, consistent with progressive colon cancer. 2. Interval development of several hypoattenuating liver lesions consistent with metastatic disease. No abdominal adenopathy or signs of peritoneal carcinomatosis. 3. Progressive intra and extrahepatic biliary dilatation of undetermined etiology. Given change from previous recent CT, correlation with liver function studies recommended. 4. Stable right pleural effusion and right basilar pulmonary opacity from recent prior studies. Of note, a right upper lobe pulmonary nodule was demonstrated on chest CT 11/20/2020 for which chest CT follow-up is recommended. That is potentially a metastasis. 5.  Aortic Atherosclerosis (ICD10-I70.0). 6. These results will be called to the ordering clinician or representative by the Radiologist Assistant, and communication documented in the PACS or [employer]. Electronically Signed   By: Richardean Sale M.D.   On: 05/01/2021 10:17    Labs:  CBC: Recent Labs    07/25/20 0452 09/13/20 1730 12/30/20 1513 01/08/21 1338 05/07/21 1123  WBC 10.3 7.0 6.7  --  8.9  HGB 12.2 10.6* 7.7* 9.3* 11.5*  HCT 38.2 35.1* 25.8* 32.5* 36.9  PLT 221 274 241  --  290    COAGS: Recent Labs  07/22/20 0442 05/07/21 1123  INR 1.3* 1.1  APTT 186*  --     BMP: Recent Labs    07/25/20 0452 07/26/20 0329 07/27/20 0418 09/13/20 1730 12/30/20 1513  NA 140 140 145 138 144  K 3.5 3.4* 5.0 4.3 5.2  CL 108 107 110 109 109*  CO2 23 25 25  21* 23  GLUCOSE 132* 108* 117* 113* 89  BUN 21 22 21 18 20   CALCIUM 8.3* 8.1* 8.4* 9.4 9.2  CREATININE 0.58 0.67 0.67 1.00 0.96  GFRNONAA >60 >60 >60 55* 54*  GFRAA >60 >60 >60  --  62    LIVER FUNCTION TESTS: Recent Labs    07/24/20 0049 07/25/20 0452 07/26/20 0329 07/27/20 0418  BILITOT 0.7 0.5 0.6 0.6  AST 23 21 19 25   ALT 13 13 14 18   ALKPHOS 51 54 51 53   PROT 7.0 6.7 6.4* 6.4*  ALBUMIN 2.8* 2.7* 2.5* 2.6*    TUMOR MARKERS: No results for input(s): AFPTM, CEA, CA199, CHROMGRNA in the last 8760 hours.  Assessment and Plan: 87 y.o. female with past medical history of arthritis, hypertension, CHF, prior hysterectomy and cholecystectomy and COVID-19 in 2021 who now presents with history of weight loss, diminished appetite ,bloody stools and imaging findings of : 1. Progressive infiltrative mass involving the cecum compared with previous CT 3 months ago, consistent with progressive colon cancer. 2. Interval development of several hypoattenuating liver lesions consistent with metastatic disease. No abdominal adenopathy or signs of peritoneal carcinomatosis. 3. Progressive intra and extrahepatic biliary dilatation of undetermined etiology. Given change from previous recent CT, correlation with liver function studies recommended. 4. Stable right pleural effusion and right basilar pulmonary opacity from recent prior studies. Of note, a right upper lobe pulmonary nodule was demonstrated on chest CT 11/20/2020 for which chest CT follow-up is recommended. That is potentially a metastasis. 5.  Aortic Atherosclerosis  She is scheduled today for image guided liver lesion biopsy for further evaluation.Risks and benefits of procedure was discussed with the patient including, but not limited to bleeding, infection, damage to adjacent structures or low yield requiring additional tests.  All of the questions were answered and there is agreement to proceed.  Consent signed and in chart.    Thank you for this interesting consult.  I greatly enjoyed meeting Jaime [name] and look forward to participating in their care.  A copy of this report was sent to the requesting provider on this date.  Electronically Signed: D. Rowe Robert, PA-C 05/07/2021, 12:18 PM   I spent a total of  25 minutes   in face to face in clinical consultation, greater than 50% of  which was counseling/coordinating care for image guided liver lesion biopsy

## 2021-05-07 NOTE — Procedures (Signed)
Interventional Radiology Procedure Note  Procedure: US guided left liver lesion biopsy  Indication: Multiple liver lesions  Findings: Please refer to procedural dictation for full description.  Complications: None  EBL: < 10 mL  Miachel Roux, MD [phone]

## 2021-05-07 NOTE — Progress Notes (Signed)
Discharge instructions reviewed with pt and her daughter. Both voice understanding.  

## 2021-05-12 NOTE — Progress Notes (Addendum)
[facility]   [phone] Fax:([phone] [place] Note   Patient Care Team: Crist Infante, MD as PCP - General (Internal Medicine) Alla Feeling, NP as Nurse Practitioner (Nurse Practitioner) Truitt Merle, MD as Consulting Physician (Hematology) Otis Brace, MD as Referring Physician (Gastroenterology) 05/13/2021  CHIEF COMPLAINTS/PURPOSE OF CONSULTATION:  Colon cancer, referred by GI Dr. Otis Brace  SUMMARY OF ONCOLOGY HISTORY  Oncology History  Adenocarcinoma of colon metastatic to liver [facility])  06/2020 Miscellaneous   Patient well until + covid-19 in 06/2020 with DVT/PE at that time. Hospitalized for anticoagulation, discharged to SNF on eliquis. [facility] 08/2020. Subsequent pneumonia 10/2020 and RUL nodule new from 2017 warranting surveillance. Ongoing weight loss and worsening anemia 12/2020 Hg 7.7 heme + stool s/p RBC and IV Feraheme. CT 01/2021 showed mass in the cecum near ileocecal valve. Pt declined colonoscopy. Repeat CT 04/2021 showed enlarging colon mass and new liver masses. Liver bx confirmed metastatic colon adeno carcinoma.    11/20/2020 Imaging   CT chest with contrast -COVID-19 pneumonia follow-up  IMPRESSION: No findings suspicious for pneumonia. Lingular and right lower lobe opacities favor atelectasis.   Moderate right pleural effusion, increased. Trace left pleural effusion, new. No frank interstitial edema.   6 mm central right upper lobe nodule, unchanged from recent CT, but new from 2017. Follow-up CT chest is suggested in 6 months.   02/12/2021 Imaging   CT AP with contrast IMPRESSION: 1. Abnormal, focal wall thickening in the medial cecum, proximal to the ileocecal valve. Imaging features highly suspicious for neoplasm in light of the reported history of blood in stool. 2. No evidence for metastatic disease in the abdomen or pelvis. No ileocolic lymphadenopathy. 3. 8 mm common bile duct diameter in the head  of the pancreas, upper normal for patient age. 4. Stable right pleural effusion with collapse/consolidation in the right lower lobe comparing to CT chest 11/20/2020. 5. Left colonic diverticulosis without diverticulitis. 6. Aortic Atherosclerosis (ICD10-I70.0).   04/30/2021 Imaging   CT AP with contrast IMPRESSION: 1. Progressive infiltrative mass involving the cecum compared with previous CT 3 months ago, consistent with progressive colon cancer. 2. Interval development of several hypoattenuating liver lesions consistent with metastatic disease. No abdominal adenopathy or signs of peritoneal carcinomatosis. 3. Progressive intra and extrahepatic biliary dilatation of undetermined etiology. Given change from previous recent CT, correlation with liver function studies recommended. 4. Stable right pleural effusion and right basilar pulmonary opacity from recent prior studies. Of note, a right upper lobe pulmonary nodule was demonstrated on chest CT 11/20/2020 for which chest CT follow-up is recommended. That is potentially a metastasis. 5.  Aortic Atherosclerosis (ICD10-I70.0).   05/07/2021 Cancer Staging   Staging form: Colon and Rectum, AJCC 8th Edition - Clinical stage from 05/07/2021: Stage Unknown (cTX, cN0, pM1) - Signed by Alla Feeling, NP on 05/13/2021  Total positive nodes: 0  Laterality: Right  Sites of metastasis: Liver    05/07/2021 Initial Biopsy   Liver biopsy FINAL MICROSCOPIC DIAGNOSIS: A. LEFT LIVER LESION, NEEDLE CORE BIOPSY: - Adenocarcinoma.  ADDENDUM: Immunohistochemistry shows the adenocarcinoma is positive with cytokeratin 20 and CDX2 and is negative with cytokeratin 7, TTF-1 and Napsin A. The immunophenotype is consistent with metastatic colorectal adenocarcinoma.   05/13/2021 Initial Diagnosis   Adenocarcinoma of colon metastatic to liver (HCC)       HISTORY OF PRESENTING ILLNESS:  Jaime [name] 87 y.o. female with past medical history including CHF  and IBS was pretty  healthy until developing COVID-19 illness and 06/2020 found to have DVT/PE at the same time.  Subsequently developed pneumonia in 10/2020, spent time and SNF, and fell at home.  She is here because of newly diagnosed colon cancer and liver masses.  She was initially seen by GI Dr. Alessandra Bevels for iron deficiency anemia and heme positive stool, Hg 10.6 on 09/13/2020 that worsened to 7.7 on 12/30/2020 with microcytosis, s/p blood transfusion, oral iron, and AC Eliquis was discontinued. CT AP with contrast on 02/12/2021 showed abnormal focal wall thickening in the medial cecum, proximal to the ileocecal valve, highly suspicious for neoplasm, without evidence of metastatic disease in the abdomen or pelvis.  There was a right pleural effusion with collapse/consolidation that was stable from previous chest CT 11/20/2020 (obtained for recent COVID-pneumonia in 06/2020) and a right upper lobe pulmonary nodule. Patient declined colonoscopy due to previous poor tolerance to prep but did agree to close monitoring.  Last colonoscopy more than 10 years ago.  Repeat CT AP 04/30/21 showed progressive infiltrative mass in the cecum and interval development of hypoattenuating liver lesions consistent with metastatic disease as well as progressive intra and extrahepatic biliary dilatation of unknown etiology.  No evidence of nodal metastasis or peritoneal carcinomatosis.  She underwent lab and liver biopsy on 05/07/2021, hemoglobin improved to 11.5.  Path showed adenocarcinoma, IHC stains positive for cytokeratin 20 and CDX2, and negative for cytokeratin 7, TTF-1, and Napsin A.  The immunophenotype is consistent with metastatic colorectal adenocarcinoma.  Socially, she lives alone but on same street as her daughter, independent with ADLs except driving.  She has some home health care from 9-1 daily.  She has 1 daughter, here with her today who is a Pharmacist, hospital.  Denies alcohol, tobacco, or drug use.  Family history is  significant for mother with ovarian cancer and a cousin with breast cancer.  Today, she presents in a wheelchair, accompanied by her daughter.  She has low appetite and weight loss, 28 pounds in 1 year (somewhat covid-illness related), more recently lost 8 pounds this month.  She has tarry and loose stools, improved with Imodium, and occasional lower abdominal pain prior to BM.  Denies obvious blood in stool.  She is on oral iron, tolerating well.  Wears compression stockings for leg edema.  Last fall was about a month ago, she took a wide turn with her walker.  She does feel off balance.  Home health is there from 9-1, she is able to bathe and change by herself but needs assistance with her lunch.  If she is alone she can get herself to and from the bathroom, gets up by herself at night if she has to. Otherwise denies nausea or vomiting, jaundice, fever, chills, cough, chest pain, dyspnea.    MEDICAL HISTORY:  Past Medical History:  Diagnosis Date   Arthritis    knees   Complication of anesthesia    problem 40 yrs ago Doctor told her anesthesia drugs caused a reaction, BP drops   Hypertension     SURGICAL HISTORY: Past Surgical History:  Procedure Laterality Date   ABDOMINAL HYSTERECTOMY     CATARACT EXTRACTION W/PHACO Left 08/09/2018   Procedure: CATARACT EXTRACTION PHACO AND INTRAOCULAR LENS PLACEMENT ([place])  ISTENT AND ISTENT INJECT LEFT;  Surgeon: Leandrew Koyanagi, MD;  Location: [facility];  Service: Ophthalmology;  Laterality: Left;   CHOLECYSTECTOMY     EYE SURGERY     cataract with lens implant-right   INSERTION OF ANTERIOR SEGMENT AQUEOUS  DRAINAGE DEVICE (ISTENT) Left 08/09/2018   Procedure: INSERTION OF ANTERIOR SEGMENT AQUEOUS DRAINAGE DEVICE (ISTENT);  Surgeon: Leandrew Koyanagi, MD;  Location: [facility];  Service: Ophthalmology;  Laterality: Left;   TOTAL KNEE ARTHROPLASTY Right 02/27/2014   Procedure: RIGHT TOTAL KNEE ARTHROPLASTY;  Surgeon: Tobi Bastos, MD;  Location: [facility];  Service: Orthopedics;  Laterality: Right;   TUBAL LIGATION      SOCIAL HISTORY: Social History   Socioeconomic History   Marital status: Widowed    Spouse name: Not on file   Number of children: 1   Years of education: Not on file   Highest education level: Not on file  Occupational History   Not on file  Tobacco Use   Smoking status: Never   Smokeless tobacco: Never  Vaping Use   Vaping Use: Never used  Substance and Sexual Activity   Alcohol use: No   Drug use: No   Sexual activity: Not on file  Other Topics Concern   Not on file  Social History Narrative   ** Merged History Encounter **       Social Determinants of Health   Financial Resource Strain: Not on file  Food Insecurity: Not on file  Transportation Needs: Not on file  Physical Activity: Not on file  Stress: Not on file  Social Connections: Not on file  Intimate Partner Violence: Not At Risk   Fear of Current or Ex-Partner: No   Emotionally Abused: No   Physically Abused: No   Sexually Abused: No    FAMILY HISTORY: Family History  Problem Relation Age of Onset   Cancer Mother        ovarian   Ovarian cancer Mother    Heart attack Father    Cancer Cousin        breast    ALLERGIES:  is allergic to cefdinir, codeine, ibandronic acid, meperidine hcl, adhesive [tape], sulfa antibiotics, and sulfonamide derivatives.  MEDICATIONS:  Current Outpatient Medications  Medication Sig Dispense Refill   acetaminophen (TYLENOL) 500 MG tablet Take 500 mg by mouth every 6 (six) hours as needed for moderate pain or headache.     albuterol (VENTOLIN HFA) 108 (90 Base) MCG/ACT inhaler Inhale 2 puffs into the lungs every 4 (four) hours as needed for wheezing or shortness of breath.     brimonidine (ALPHAGAN) 0.2 % ophthalmic solution Place 1 drop into the left eye 2 (two) times daily.     buPROPion (WELLBUTRIN XL) 150 MG 24 hr tablet Take 150 mg by mouth daily.       cholecalciferol (VITAMIN D3) 25 MCG (1000 UNIT) tablet Take 1,000 Units by mouth daily.     dorzolamide-timolol (COSOPT) 22.3-6.8 MG/ML ophthalmic solution Place 1 drop into both eyes 2 (two) times daily.     escitalopram (LEXAPRO) 5 MG tablet Take 5 mg by mouth daily.     Fe Fum-FePoly-Vit C-Vit B3 (INTEGRA) 62.5-62.5-40-3 MG CAPS Take 1 capsule by mouth daily.     fexofenadine (ALLEGRA) 180 MG tablet Take 180 mg by mouth daily as needed for allergies or rhinitis.     fluticasone (FLONASE) 50 MCG/ACT nasal spray Place 1 spray into both nostrils daily as needed for allergies or rhinitis.     furosemide (LASIX) 20 MG tablet Take one tablet (55m total) by mouth every other day, alternating with two tablets (453mtotal) by mouth every other day. (Patient taking differently: Take 20-40 mg by mouth See admin instructions. Take one tablet (2086motal)  by mouth every other day, alternating with two tablets (42m total) by mouth every other day.) 132 tablet 3   Guaifenesin (MUCINEX MAXIMUM STRENGTH) 1200 MG TB12 Take 1,200 mg by mouth 2 (two) times daily as needed (congestion).     KLOR-CON M20 20 MEQ tablet Take 10 mEq by mouth every Monday, Wednesday, and Friday.     latanoprost (XALATAN) 0.005 % ophthalmic solution Place 1 drop into both eyes at bedtime.     loperamide (IMODIUM A-D) 2 MG tablet Take 2-4 mg by mouth 4 (four) times daily as needed for diarrhea or loose stools.     Multiple Vitamins-Minerals (ADULT GUMMY PO) Take 2 capsules by mouth daily.     pantoprazole (PROTONIX) 40 MG tablet Take 40 mg by mouth 2 (two) times daily.     rosuvastatin (CRESTOR) 10 MG tablet Take 10 mg by mouth daily.     vitamin C (ASCORBIC ACID) 500 MG tablet Take 500 mg by mouth daily.     No current facility-administered medications for this visit.    REVIEW OF SYSTEMS:   Constitutional: Denies fevers, chills or abnormal night sweats (+) low appetite (+) weight loss  Eyes: Denies blurriness of vision, double  vision or watery eyes Ears, nose, mouth, throat, and face: Denies mucositis or sore throat Respiratory: Denies cough, dyspnea or wheezes Cardiovascular: Denies palpitation, chest discomfort or lower extremity swelling Gastrointestinal:  Denies nausea, vomiting, constipation, diarrhea, heartburn or change in bowel habits (+) loose stools (+) low abd pain prior to BM Skin: Denies abnormal skin rashes Lymphatics: Denies new lymphadenopathy or easy bruising Neurological:Denies numbness, tingling or new weaknesses (+) falls (+) balance issue Behavioral/Psych: Mood is stable, no new changes  All other systems were reviewed with the patient and are negative.  PHYSICAL EXAMINATION: ECOG PERFORMANCE STATUS: 1 - Symptomatic but completely ambulatory  Vitals:   05/13/21 1057  BP: (!) 177/64  Pulse: 61  Resp: 19  Temp: 98.2 F (36.8 C)  SpO2: 99%   Filed Weights   05/13/21 1057  Weight: 177 lb (80.3 kg)    GENERAL:alert, no distress and comfortable SKIN: no rash  EYES: sclera clear LUNGS: clear with normal breathing effort HEART: regular rate & rhythm, no lower extremity edema ABDOMEN:abdomen soft, non-tender and normal bowel sounds PSYCH: alert & oriented x 3 with fluent speech NEURO: exam performed in wheelchair   LABORATORY DATA:  I have reviewed the data as listed CBC Latest Ref Rng & Units 05/07/2021 01/08/2021 12/30/2020  WBC 4.0 - 10.5 K/uL 8.9 - 6.7  Hemoglobin 12.0 - 15.0 g/dL 11.5(L) 9.3(L) 7.7(L)  Hematocrit 36.0 - 46.0 % 36.9 32.5(L) 25.8(L)  Platelets 150 - 400 K/uL 290 - 241   CMP Latest Ref Rng & Units 12/30/2020 09/13/2020 07/27/2020  Glucose 65 - 99 mg/dL 89 113(H) 117(H)  BUN 8 - 27 mg/dL 20 18 21   Creatinine 0.57 - 1.00 mg/dL 0.96 1.00 0.67  Sodium 134 - 144 mmol/L 144 138 145  Potassium 3.5 - 5.2 mmol/L 5.2 4.3 5.0  Chloride 96 - 106 mmol/L 109(H) 109 110  CO2 20 - 29 mmol/L 23 21(L) 25  Calcium 8.7 - 10.3 mg/dL 9.2 9.4 8.4(L)  Total Protein 6.5 - 8.1 g/dL - -  6.4(L)  Total Bilirubin 0.3 - 1.2 mg/dL - - 0.6  Alkaline Phos 38 - 126 U/L - - 53  AST 15 - 41 U/L - - 25  ALT 0 - 44 U/L - - 18     RADIOGRAPHIC  STUDIES: I have personally reviewed the radiological images as listed and agreed with the findings in the report. CT ABDOMEN PELVIS W CONTRAST  Result Date: 05/01/2021 CLINICAL DATA:  Follow up abnormal CT with cecal wall thickening suspicious for neoplasm. Blood in stool for 3 months. Previous hysterectomy and cholecystectomy. Creatinine was obtained on site at [facility] at [address] Results: Creatinine 0.9 mg/dL. EXAM: CT ABDOMEN AND PELVIS WITH CONTRAST TECHNIQUE: Multidetector CT imaging of the abdomen and pelvis was performed using the standard protocol following bolus administration of intravenous contrast. CONTRAST:  117m ISOVUE-300 IOPAMIDOL (ISOVUE-300) INJECTION 61% COMPARISON:  Abdominopelvic CT 02/12/2021 and chest CT 11/20/2020. FINDINGS: Lower chest: Dependent right-greater-than-left pleural effusions and focal right lower lobe consolidation are unchanged from the recent prior studies. There is underlying mild centrilobular and paraseptal emphysema. Aortic atherosclerosis noted. Hepatobiliary: Interval development of multiple hypoattenuating liver lesions consistent with metastatic disease. Largest lesion is located in the dome of the right hepatic lobe (segment 8), measuring 1.3 cm on image 11/2. Additional lesions include a 0.8 cm lesion in the left lobe (image 15/2) and lesions in the right lobe measuring 0.8 and 0.8 cm (image 17/2) and 0.8 cm (image 20/2). Status post cholecystectomy with interval progression in intra and extrahepatic biliary dilatation. The common hepatic duct measures up to 1.6 cm in diameter, although tapers distally. No evidence of choledocholithiasis. Pancreas: No evidence of pancreatic or ampullary mass. There is no pancreatic ductal dilatation or surrounding inflammation. Spleen: Normal in size  without focal abnormality. Adrenals/Urinary Tract: Both adrenal glands appear normal. The kidneys appear normal without evidence of urinary tract calculus, suspicious lesion or hydronephrosis. No bladder abnormalities are seen. Stomach/Bowel: Enteric contrast was administered and has passed into the mid colon. The stomach is decompressed. No small bowel abnormalities are identified. There is mildly progressive circumferential wall thickening in the cecum which remains highly worrisome for infiltrating colon cancer. No other colonic lesions are identified. There is no evidence of bowel obstruction or surrounding inflammation. Moderate diverticular changes are present within the sigmoid colon. Vascular/Lymphatic: There are no enlarged abdominal or pelvic lymph nodes. Aortic and branch vessel atherosclerosis without acute vascular findings. The portal, superior mesenteric and splenic veins are patent. Reproductive: Hysterectomy.  No adnexal mass. Other: No ascites or peritoneal nodularity. Musculoskeletal: No acute or significant osseous findings. Stable chronic L4 compression fracture. Mild lower lumbar spondylosis with probable interbody ankylosis at L5-S1. IMPRESSION: 1. Progressive infiltrative mass involving the cecum compared with previous CT 3 months ago, consistent with progressive colon cancer. 2. Interval development of several hypoattenuating liver lesions consistent with metastatic disease. No abdominal adenopathy or signs of peritoneal carcinomatosis. 3. Progressive intra and extrahepatic biliary dilatation of undetermined etiology. Given change from previous recent CT, correlation with liver function studies recommended. 4. Stable right pleural effusion and right basilar pulmonary opacity from recent prior studies. Of note, a right upper lobe pulmonary nodule was demonstrated on chest CT 11/20/2020 for which chest CT follow-up is recommended. That is potentially a metastasis. 5.  Aortic Atherosclerosis  (ICD10-I70.0). 6. These results will be called to the ordering clinician or representative by the Radiologist Assistant, and communication documented in the PACS or [employer] Electronically Signed   By: WRichardean SaleM.D.   On: 05/01/2021 10:17   [place]CORE BIOPSY (LIVER)  Result Date: 05/07/2021 INDICATION: Colon cancer.  New liver lesions. EXAM: CT and Ultrasound-guided liver mass biopsy MEDICATIONS: None. ANESTHESIA/SEDATION: Moderate (conscious) sedation was employed during this procedure. A total of  Versed 2 mg and Fentanyl 150 mcg was administered intravenously. Moderate Sedation Time: 30 minutes. The patient's level of consciousness and vital signs were monitored continuously by radiology nursing throughout the procedure under my direct supervision. COMPLICATIONS: None immediate. PROCEDURE: Informed written consent was obtained from the patient after a thorough discussion of the procedural risks, benefits and alternatives. All questions were addressed. Maximal Sterile Barrier Technique was utilized including caps, mask, sterile gowns, sterile gloves, sterile drape, hand hygiene and skin antiseptic. A timeout was performed prior to the initiation of the procedure. Noncontrast CT of the abdomen was performed to confirm location hepatic lesions. Biopsy was predominately performed with ultrasound guidance. Two hypoechoic lesions could be identified on ultrasound. The left hepatic lobe lesion appeared more amenable to percutaneous biopsy. Sterile ultrasound probe cover and gel utilized throughout the procedure. Following local lidocaine administration, 17 gauge introducer needle advanced into the hypoechoic left hepatic mass. Four 18 gauge cores were obtained and sent to pathology in formalin. Gel-Foam slurry administered through the introducer needle and 5 minutes of manual compression applied for hemostasis. IMPRESSION: CT and ultrasound guided biopsy of left liver mass as above. Electronically Signed    By: Miachel Roux M.D.   On: 05/07/2021 15:41    ASSESSMENT & PLAN: 87 yo female   Adenocarcinoma of cecum with liver metastasis, stage IV, MMR and FO pending  -we reviewed her medical record in detail including lab, imaging, and pathology  -she presented with IDA, found to have cecum mass on imaging, colonoscopy was declined. After 3 months, she was found to have enlarging cecal mass and new liver masses. Liver biopsy confirmed metastatic colon adenocarcinoma  -we discussed this is stage IV cancer, she is not a surgical candidate for resection and therefore not curable at this stage, but still treatable -she values comfort and quality of life over prolonging her life. She is interested to see if she is a candidate for immunotherapy, will request MMR, MSI, and FO molecular studies.  -Due to her age and other health conditions, she is not an ideal candidate for intensive chemotherapy.  -we discussed low intensity systemic treatment such as single agent Xeloda. We discussed potential benefit, toxicities and symptom management.  -Chemotherapy consent: Side effects including but not limited to fatigue, nausea, vomiting, diarrhea, mucositis, skin/nail toxicity including hand-foot syndrome, rash, neuropathy, fluid retention, renal and kidney dysfunction, neutropenic fever, need for blood transfusion, bleeding, were discussed with patient in great detail. She agrees to proceed. The goal is palliative, to control disease, improve symptoms, and prolong her life.  -If she is a candidate for immunotherapy, will likely try that alone as first-line therapy. -we discussed palliative care alone or hospice. She has a home health aide currently, but may need more care soon. Daughter lives locally but still works. She is concerned about her mother's ability to still live alone.  We will continue to monitor the situation closely -will refer her to genetics and dietician -f/up in 2 weeks to review additional molecular  studies and start treatment  2. Low appetite, weight loss -secondary to #1  -she has low appetite and lost 28 lbs in 1 year, some of which is related to covid-19 illness. She lost 8 lbs this month -we reviewed supplements, nutrition, hydration.  -we discussed appetite stimulant such as mirtazapine. She prefers to hold for now -will refer to [facility] nutritionist -She has occasional lower abdominal cramping prior to BM, no current signs of bowel obstruction  3. IDA, secondary to #1 -  she developed mild anemia Hg 10.6 in 08/2020, after covid-infection, started oral iron  -Hg worsened to 7.7 12/30/20 when she was found to have heme +stool. S/p RBC transfusion, IV Feraheme 03/20/21 and 03/27/21, and continues oral iron -eliquis was stopped.  -CT 01/2021 showed mass in the cecum, pt declined colonoscopy  -anemia improved, Hg 11.5 on 05/07/21  4. COVID-19 infection, DVT/PE, and subsequent pneumonia  -She presented to ED in 06/2020 with dyspnea, work up showed COVID-19 + and saddle PE. She was admitted for management, d/c'd on eliquis to SNF  -subsequently developed pneumonia in 10/2020, recovered  -requires walker at home, some assistance to stand from wheelchair today but does not need assistance at home  5. Genetics  -due to her personal history of colon cancer, and family h/o ovarian and breast cancer, she qualifies for genetic testing to r/o inheritable cancer syndrome -I reviewed with her daughter she should comply with age appropriate cancer screenings including annual mammogram, colonoscopy per GI, and gyn screenings. She understands -pt is interested and agrees   6. Goals of care  -We reviewed the aggressive nature of her disease, given the new liver metastasis in 3 months.  She understands her metastatic cancer is not curable but treatable, the goal is palliative -We discussed prognosis and life expectancy, with and without treatment -Patient is DNR, daughter [name] is only child and has  medical HCPOA, she will bring a copy at next visit -Pt's duaghter is second grade teacher, lives close but still works.  Transportation may be an issue in the future, will refer to social work if needed  Plan: -Chart reviewed -Urgent referral to genetics this week or next, with lab -Refer to dietitian -Will request MMR, MSI and [facility] -Follow-up in 2 weeks to begin Xeloda, unless she is a candidate for first-line immunotherapy  Orders Placed This Encounter  Procedures   CBC with Differential ([facility] Only)    Standing Status:   Standing    Number of Occurrences:   50    Standing Expiration Date:   05/13/2022   CEA (IN HOUSE-[facility])FOR [facility] ONLY    Standing Status:   Standing    Number of Occurrences:   20    Standing Expiration Date:   05/13/2022   CMP ([facility] only)    Standing Status:   Standing    Number of Occurrences:   50    Standing Expiration Date:   05/13/2022   Iron and TIBC    Standing Status:   Standing    Number of Occurrences:   20    Standing Expiration Date:   05/13/2022   Ferritin    Standing Status:   Standing    Number of Occurrences:   20    Standing Expiration Date:   05/13/2022   Ambulatory Referral to [facility] Nutrition    Referral Priority:   Routine    Referral Type:   Consultation    Referral Reason:   Specialty Services Required    Number of Visits Requested:   1   Ambulatory referral to Genetics    Referral Priority:   Routine    Referral Type:   Consultation    Referral Reason:   Specialty Services Required    Number of Visits Requested:   1     All questions were answered. The patient knows to call the clinic with any problems, questions or concerns.     Alla Feeling, NP 05/13/2021   Addendum  I have seen the patient, examined her. I agree with the assessment and and plan and have edited the notes.   87 yo female with PMH of CHF and IBS, COVID in August 2021, presented with worsening fatigue, weight loss, and iron  deficient anemia.  She was found to have a cecal mass on CT scan in March 2021, but declined colonoscopy.  Unfortunately she has developed multiple liver lesions 3 months later, and a biopsy confirmed metastatic colon cancer.  She has no significant bowel obstruction, or overt GI bleeding.  We discussed the natural history of metastatic colon cancer, and overall poor prognosis.  Due to the diffuse liver mets and her advanced age, she is not a candidate for surgery.  We discussed palliative chemotherapy, such as oral capecitabine, with or without bevacizumab.  I will also check MSI and [facility] genomic testing to see if she is a candidate for EGFR, immunotherapy, or other targeted therapy.  Patient is interested in quality of life, not much prolong her life.  She is open to treatment.  I plan to see her back in 2 weeks to start Xeloda. If she has MSI-H disease, then we will offer Keytruda.  Due to her history of malignancy, will also set up a genetic testing. All questions were answered.   Truitt Merle  05/13/2021

## 2021-05-13 ENCOUNTER — Encounter: Payer: Self-pay | Admitting: Nurse Practitioner

## 2021-05-13 ENCOUNTER — Inpatient Hospital Stay: Payer: Medicare Other | Attending: Nurse Practitioner | Admitting: Nurse Practitioner

## 2021-05-13 ENCOUNTER — Other Ambulatory Visit: Payer: Self-pay

## 2021-05-13 DIAGNOSIS — D508 Other iron deficiency anemias: Secondary | ICD-10-CM | POA: Insufficient documentation

## 2021-05-13 DIAGNOSIS — C787 Secondary malignant neoplasm of liver and intrahepatic bile duct: Secondary | ICD-10-CM | POA: Diagnosis not present

## 2021-05-13 DIAGNOSIS — I509 Heart failure, unspecified: Secondary | ICD-10-CM | POA: Insufficient documentation

## 2021-05-13 DIAGNOSIS — C180 Malignant neoplasm of cecum: Secondary | ICD-10-CM | POA: Insufficient documentation

## 2021-05-13 DIAGNOSIS — C189 Malignant neoplasm of colon, unspecified: Secondary | ICD-10-CM

## 2021-05-13 MED ORDER — CAPECITABINE 500 MG PO TABS
ORAL_TABLET | ORAL | 1 refills | Status: DC
  Filled 2021-05-13: qty 35, fill #0

## 2021-05-13 NOTE — Progress Notes (Signed)
Met with patient and her daughter Baker Janus today at her initial medical oncology consult with Cira Rue NP and Dr. Truitt Merle. I introduced myself and explained my role as nurse navigator.  They were given my direct contact information and informed that I am retiring as of 05/21/2021 but that [facility] RN will be assuming my role.  All their questions were answered.

## 2021-05-14 ENCOUNTER — Other Ambulatory Visit (HOSPITAL_COMMUNITY): Payer: Self-pay

## 2021-05-14 ENCOUNTER — Telehealth: Payer: Self-pay | Admitting: Pharmacist

## 2021-05-14 ENCOUNTER — Telehealth: Payer: Self-pay

## 2021-05-14 DIAGNOSIS — C189 Malignant neoplasm of colon, unspecified: Secondary | ICD-10-CM

## 2021-05-14 NOTE — Telephone Encounter (Signed)
Oral Oncology Patient Advocate Encounter  After completing a benefits investigation, prior authorization for Xeloda is not required at this time through patient's insurance.  Patient's copay is $134.       [place] Patient [place] Phone [phone] Fax [phone] 05/14/2021 8:14 AM

## 2021-05-14 NOTE — Telephone Encounter (Signed)
Oral Oncology Pharmacist Encounter  Received new prescription for Xeloda (capecitabine) for the treatment of metastatic colon cancer, planned duration until disease progression or unacceptable drug toxicity.  Prescription dose and frequency assessed. Patient to not start therapy until after next scheduled MD visit pending further workup.   Patient to have baseline labs scheduled 05/21/21.   Current medication list in Epic reviewed, DDIs with Xeloda identified: Category C DDI between Xeloda and Pantoprazole - proton-pump inhibitors can decrease efficacy of Xeloda - if patient is able, will discuss with patient alternatives to pantoprazole, such as H2RA's like famotidine while on Xeloda.  Evaluated chart and no patient barriers to medication adherence noted.   Patient agreement for treatment documented in MD note on 05/13/21.  Prescription has been e-scribed to the [facility] for benefits analysis and approval.  Oral Oncology Clinic will continue to follow for insurance authorization, copayment issues, initial counseling and start date.  Leron Croak, PharmD, BCPS Hematology/Oncology Clinical Pharmacist [facility] [phone] 05/14/2021 8:12 AM

## 2021-05-20 ENCOUNTER — Ambulatory Visit: Payer: Medicare Other | Admitting: Dietician

## 2021-05-20 NOTE — Progress Notes (Signed)
Nutrition Assessment   Reason for Assessment: Provider request   ASSESSMENT: 87 year old female with newly diagnosed colon cancer metastatic to liver. Plans to start oral Xeloda pending further work-up. Patient is followed by Dr. Burr Medico.  Past medical history of CHF, edema of lower extremities, IBS, arthritis, frequent falls, HTN, COVID-19 infection (06/2020)  Spoke with patient via telephone. She reports doing fair and feeling tired. Patient states she has not had much of an appetite since Covid infection in August. Patient states she has not gotten all of her taste buds back, finds most foods to be bland. She uses lemon juice and extra seasonings to help. Patient reports eating something small a few hours after getting up such a piece of toast, donut, poptart, or bowl of cereal. She usually has a sandwich late afternoon and daughter will fix a dinner meal (hamburgers, pork, beef, ham, pintos, vegetables) Patient does not like the taste of Boost or Ensure supplements. Patient reports she "drinks enough water to sink a battle ship" with her morning medications and a little bit of Pepsi. Patient tries not to drink too much due to her fluid pill. States "that pill keeps me very busy" Patient reports occasional urinary incontinence. She endorses history diarrhea/loose stools, takes Imodium as needed.     Medications: Xeloda, MVI, Integra, D3, Lasix, Protonix, Klor-con, Wellbutrin, Imodium, Crestor   Labs: reviewed   Anthropometrics: Weights have decreased 23 lbs (11.5%) from usual weight over the last 10 months. This is insignificant for time frame, however concerning given advanced age  Height: 5'5" Weight: 177 lb  UBW: 200 lbs (06/2020 prior to Covid-19 infection) BMI: 29.45   NUTRITION DIAGNOSIS: Unintentional weight loss related to history of acute illness and newly diagnosed colon cancer metastatic to liver as evidenced by reported poor appetite and 11.5% (23 lb) decrease from usual weight  in 10 months.   INTERVENTION:  Educated on importance of adequate calorie and protein energy intake to maintain strength, weights, nutrition Encouraged small frequent meals and snacks with focus on protein Ideas for high calorie, high protein snacks, will mail handout with recipes Educated on low-fiber foods, will mail handout Suggested alternate oral nutrition supplements for pt to try (CIB, [employer])  Contact information provided   MONITORING, EVALUATION, GOAL: Patient will tolerate adequate calories and protein to minimize weight loss   Next Visit: To be scheduled via telephone ~3-4 weeks

## 2021-05-21 ENCOUNTER — Other Ambulatory Visit: Payer: Self-pay | Admitting: Genetic Counselor

## 2021-05-21 ENCOUNTER — Inpatient Hospital Stay (HOSPITAL_BASED_OUTPATIENT_CLINIC_OR_DEPARTMENT_OTHER): Payer: Medicare Other | Admitting: Genetic Counselor

## 2021-05-21 ENCOUNTER — Other Ambulatory Visit: Payer: Self-pay

## 2021-05-21 ENCOUNTER — Inpatient Hospital Stay: Payer: Medicare Other

## 2021-05-21 DIAGNOSIS — C189 Malignant neoplasm of colon, unspecified: Secondary | ICD-10-CM

## 2021-05-21 DIAGNOSIS — Z800 Family history of malignant neoplasm of digestive organs: Secondary | ICD-10-CM | POA: Diagnosis not present

## 2021-05-21 DIAGNOSIS — C787 Secondary malignant neoplasm of liver and intrahepatic bile duct: Secondary | ICD-10-CM | POA: Diagnosis not present

## 2021-05-21 DIAGNOSIS — C180 Malignant neoplasm of cecum: Secondary | ICD-10-CM | POA: Diagnosis not present

## 2021-05-21 DIAGNOSIS — Z803 Family history of malignant neoplasm of breast: Secondary | ICD-10-CM

## 2021-05-21 DIAGNOSIS — Z8041 Family history of malignant neoplasm of ovary: Secondary | ICD-10-CM

## 2021-05-21 DIAGNOSIS — D508 Other iron deficiency anemias: Secondary | ICD-10-CM | POA: Diagnosis not present

## 2021-05-21 DIAGNOSIS — I509 Heart failure, unspecified: Secondary | ICD-10-CM | POA: Diagnosis not present

## 2021-05-21 LAB — CMP (CANCER CENTER ONLY)
ALT: 15 U/L (ref 0–44)
AST: 21 U/L (ref 15–41)
Albumin: 4 g/dL (ref 3.5–5.0)
Alkaline Phosphatase: 67 U/L (ref 38–126)
Anion gap: 8 (ref 5–15)
BUN: 22 mg/dL (ref 8–23)
CO2: 29 mmol/L (ref 22–32)
Calcium: 9.9 mg/dL (ref 8.9–10.3)
Chloride: 104 mmol/L (ref 98–111)
Creatinine: 0.97 mg/dL (ref 0.44–1.00)
GFR, Estimated: 57 mL/min — ABNORMAL LOW (ref >60)
Glucose, Bld: 92 mg/dL (ref 70–99)
Potassium: 4.1 mmol/L (ref 3.5–5.1)
Sodium: 141 mmol/L (ref 135–145)
Total Bilirubin: 0.5 mg/dL (ref 0.3–1.2)
Total Protein: 8 g/dL (ref 6.5–8.1)

## 2021-05-21 LAB — CBC WITH DIFFERENTIAL (CANCER CENTER ONLY)
Abs Immature Granulocytes: 0.01 10*3/uL (ref 0.00–0.07)
Basophils Absolute: 0 10*3/uL (ref 0.0–0.1)
Basophils Relative: 0 %
Eosinophils Absolute: 0.2 10*3/uL (ref 0.0–0.5)
Eosinophils Relative: 2 %
HCT: 36.9 % (ref 36.0–46.0)
Hemoglobin: 11.7 g/dL — ABNORMAL LOW (ref 12.0–15.0)
Immature Granulocytes: 0 %
Lymphocytes Relative: 29 %
Lymphs Abs: 2 10*3/uL (ref 0.7–4.0)
MCH: 28.3 pg (ref 26.0–34.0)
MCHC: 31.7 g/dL (ref 30.0–36.0)
MCV: 89.1 fL (ref 80.0–100.0)
Monocytes Absolute: 1.1 10*3/uL — ABNORMAL HIGH (ref 0.1–1.0)
Monocytes Relative: 16 %
Neutro Abs: 3.6 10*3/uL (ref 1.7–7.7)
Neutrophils Relative %: 53 %
Platelet Count: 338 10*3/uL (ref 150–400)
RBC: 4.14 MIL/uL (ref 3.87–5.11)
RDW: 16 % — ABNORMAL HIGH (ref 11.5–15.5)
WBC Count: 6.9 10*3/uL (ref 4.0–10.5)
nRBC: 0 % (ref 0.0–0.2)

## 2021-05-21 LAB — GENETIC SCREENING ORDER

## 2021-05-21 NOTE — Progress Notes (Signed)
REFERRING PROVIDER: Alla Feeling, NP [place],  [place] [address]  PRIMARY PROVIDER:  Crist Infante, MD  PRIMARY REASON FOR VISIT:  1. Adenocarcinoma of colon metastatic to liver ([place])   2. Family history of ovarian cancer   3. Family history of breast cancer   4. Family history of pancreatic cancer       HISTORY OF PRESENT ILLNESS:   Ms. Schnackenberg, a 87 y.o. female, was seen for a [place] cancer genetics consultation at the request of Cira Rue, NP, due to a personal and family history of cancer.  Ms. Jauregui presents to clinic today to discuss the possibility of a hereditary predisposition to cancer, genetic testing, and to further clarify her future cancer risks, as well as potential cancer risks for family members.   In 2022, at the age of 63, Ms. Bruington was diagnosed with adenocarcinoma of the cecum with liver metastasis. She also has a history of basal cell carcinoma removed from her right cheek in 2013.   CANCER HISTORY:  Oncology History  Adenocarcinoma of colon metastatic to liver [facility])  06/2020 Miscellaneous   Patient well until + covid-19 in 06/2020 with DVT/PE at that time. Hospitalized for anticoagulation, discharged to SNF on eliquis. [place] 08/2020. Subsequent pneumonia 10/2020 and RUL nodule new from 2017 warranting surveillance. Ongoing weight loss and worsening anemia 12/2020 Hg 7.7 heme + stool s/p RBC and IV Feraheme. CT 01/2021 showed mass in the cecum near ileocecal valve. Pt declined colonoscopy. Repeat CT 04/2021 showed enlarging colon mass and new liver masses. Liver bx confirmed metastatic colon adeno carcinoma.    11/20/2020 Imaging   CT chest with contrast -COVID-19 pneumonia follow-up  IMPRESSION: No findings suspicious for pneumonia. Lingular and right lower lobe opacities favor atelectasis.   Moderate right pleural effusion, increased. Trace left pleural effusion, new. No frank interstitial edema.   6 mm central right upper lobe nodule,  unchanged from recent CT, but new from 2017. Follow-up CT chest is suggested in 6 months.   02/12/2021 Imaging   CT AP with contrast IMPRESSION: 1. Abnormal, focal wall thickening in the medial cecum, proximal to the ileocecal valve. Imaging features highly suspicious for neoplasm in light of the reported history of blood in stool. 2. No evidence for metastatic disease in the abdomen or pelvis. No ileocolic lymphadenopathy. 3. 8 mm common bile duct diameter in the head of the pancreas, upper normal for patient age. 4. Stable right pleural effusion with collapse/consolidation in the right lower lobe comparing to CT chest 11/20/2020. 5. Left colonic diverticulosis without diverticulitis. 6. Aortic Atherosclerosis (ICD10-I70.0).   04/30/2021 Imaging   CT AP with contrast IMPRESSION: 1. Progressive infiltrative mass involving the cecum compared with previous CT 3 months ago, consistent with progressive colon cancer. 2. Interval development of several hypoattenuating liver lesions consistent with metastatic disease. No abdominal adenopathy or signs of peritoneal carcinomatosis. 3. Progressive intra and extrahepatic biliary dilatation of undetermined etiology. Given change from previous recent CT, correlation with liver function studies recommended. 4. Stable right pleural effusion and right basilar pulmonary opacity from recent prior studies. Of note, a right upper lobe pulmonary nodule was demonstrated on chest CT 11/20/2020 for which chest CT follow-up is recommended. That is potentially a metastasis. 5.  Aortic Atherosclerosis (ICD10-I70.0).   05/07/2021 Cancer Staging   Staging form: Colon and Rectum, AJCC 8th Edition - Clinical stage from 05/07/2021: Stage Unknown (cTX, cN0, pM1) - Signed by Alla Feeling, NP on 05/13/2021  Total  positive nodes: 0  Laterality: Right  Sites of metastasis: Liver    05/07/2021 Initial Biopsy   Liver biopsy FINAL MICROSCOPIC DIAGNOSIS: A. LEFT  LIVER LESION, NEEDLE CORE BIOPSY: - Adenocarcinoma.  ADDENDUM: Immunohistochemistry shows the adenocarcinoma is positive with cytokeratin 20 and CDX2 and is negative with cytokeratin 7, TTF-1 and Napsin A. The immunophenotype is consistent with metastatic colorectal adenocarcinoma.   05/13/2021 Initial Diagnosis   Adenocarcinoma of colon metastatic to liver [facility])       RISK FACTORS:  Menarche was at age 31.  First live birth at age 71.  OCP use for approximately 0 years.  Ovaries intact: yes.  Hysterectomy: yes.  Menopausal status: postmenopausal.  HRT use: 0 years. Colonoscopy: yes;  more than 10 years ago . Mammogram within the last year: no. Number of breast biopsies: 1. Any excessive radiation exposure in the past: no  Past Medical History:  Diagnosis Date   Arthritis    knees   Complication of anesthesia    problem 40 yrs ago Doctor told her anesthesia drugs caused a reaction, BP drops   Family history of breast cancer    Family history of ovarian cancer    Family history of pancreatic cancer    Hypertension     Past Surgical History:  Procedure Laterality Date   ABDOMINAL HYSTERECTOMY     CATARACT EXTRACTION W/PHACO Left 08/09/2018   Procedure: CATARACT EXTRACTION PHACO AND INTRAOCULAR LENS PLACEMENT ([place])  ISTENT AND ISTENT INJECT LEFT;  Surgeon: Leandrew Koyanagi, MD;  Location: [place];  Service: Ophthalmology;  Laterality: Left;   CHOLECYSTECTOMY     EYE SURGERY     cataract with lens implant-right   INSERTION OF ANTERIOR SEGMENT AQUEOUS DRAINAGE DEVICE (ISTENT) Left 08/09/2018   Procedure: INSERTION OF ANTERIOR SEGMENT AQUEOUS DRAINAGE DEVICE (ISTENT);  Surgeon: Leandrew Koyanagi, MD;  Location: [place];  Service: Ophthalmology;  Laterality: Left;   TOTAL KNEE ARTHROPLASTY Right 02/27/2014   Procedure: RIGHT TOTAL KNEE ARTHROPLASTY;  Surgeon: Tobi Bastos, MD;  Location: [facility];  Service: Orthopedics;  Laterality: Right;    TUBAL LIGATION      Social History   Socioeconomic History   Marital status: Widowed    Spouse name: Not on file   Number of children: 1   Years of education: Not on file   Highest education level: Not on file  Occupational History   Not on file  Tobacco Use   Smoking status: Never   Smokeless tobacco: Never  Vaping Use   Vaping Use: Never used  Substance and Sexual Activity   Alcohol use: No   Drug use: No   Sexual activity: Not on file  Other Topics Concern   Not on file  Social History Narrative   ** Merged History Encounter **       Social Determinants of Health   Financial Resource Strain: Not on file  Food Insecurity: Not on file  Transportation Needs: Not on file  Physical Activity: Not on file  Stress: Not on file  Social Connections: Not on file     FAMILY HISTORY:  We obtained a detailed, 4-generation family history.  Significant diagnoses are listed below: Family History  Problem Relation Age of Onset   Ovarian cancer Mother 37       metastatic to colon   Heart attack Father    Aneurysm Sister        abdominal   Alzheimer's disease Sister    Breast cancer Maternal Aunt  dx >50   Breast cancer Cousin        dx >50, maternal cousin   Pancreatic cancer Cousin        dx >7s, maternal cousin   Cancer Cousin        unknown type, dx 66s, maternal cousin   Ms. Baker has one daughter Baker Janus, age 33). She had two sisters - one died at age 76 and the other is currently alive at age 59. None of these relatives have had cancer.  Ms. Hustead mother died at age 45 from ovarian cancer that metastasized to the colon (diagnosed age 27). There were seven maternal aunts and two maternal uncles. One aunt had breast cancer (diagnosed older than 30). Three cousins had cancer - one had breast cancer older than 55, one had pancreatic cancer in his 87s, and the third had an unknown cancer in her 7s. Ms. Pendell maternal grandmother died in her 85s without cancer.  Her maternal grandfather died younger than 61 without cancer.  Ms. Guterrez's father died at age 37 without cancer. There were three paternal aunts and one paternal uncle. There is no known cancer among paternal aunts/uncles or paternal cousins. Ms. Lauricella has limited information about her paternal grandparents.   Ms. Zurn is unaware of previous family history of genetic testing for hereditary cancer risks. Patient's maternal ancestors are of English descent, and paternal ancestors are of unknown descent. There is no reported Ashkenazi Jewish ancestry. There is no known consanguinity.  GENETIC COUNSELING ASSESSMENT: Ms. Nop is a 87 y.o. female with a personal history of colon cancer and a family history of ovarian cancer, breast cancer, and pancreatic cancer, which is somewhat suggestive of a hereditary cancer syndrome and predisposition to cancer. We, therefore, discussed and recommended the following at today's visit.   DISCUSSION: We discussed that approximately 5-10% of cancer is hereditary, with most cases of hereditary colorectal cancer associated with Lynch syndrome. Most hereditary cases of ovarian, breast, and pancreatic cancer are associated with the BRCA1 and BRCA2 genes. There are other genes that can be associated with hereditary predisposition to one or more of these cancers, including ATM, APC, MUTYH, CHEK2, PALB2, BRIP1, RAD51C, RAD51D, etc. We discussed that testing is beneficial for several reasons, including knowing about other cancer risks, identifying potential screening and risk-reduction options that may be appropriate, and to understand if other family members could be at risk for cancer and allow them to undergo genetic testing.  We reviewed the characteristics, features and inheritance patterns of hereditary cancer syndromes. We also discussed genetic testing, including the appropriate family members to test, the process of testing, insurance coverage and turn-around-time for  results. We discussed the implications of a negative, positive and/or variant of uncertain significant result. We recommended Ms. Damon pursue genetic testing for the [facility] Multi-Cancer + RNAinsight gene panel.   The Multi-Cancer + RNA Panel offered by [facility] includes sequencing and/or deletion/duplication analysis of the following 84 genes:  AIP*, ALK, APC*, ATM*, AXIN2*, BAP1*, BARD1*, BLM*, BMPR1A*, BRCA1*, BRCA2*, BRIP1*, CASR, CDC73*, CDH1*, CDK4, CDKN1B*, CDKN1C*, CDKN2A, CEBPA, CHEK2*, CTNNA1*, DICER1*, DIS3L2*, EGFR, EPCAM, FH*, FLCN*, GATA2*, GPC3, GREM1, HOXB13, HRAS, KIT, MAX*, MEN1*, MET, MITF, MLH1*, MSH2*, MSH3*, MSH6*, MUTYH*, NBN*, NF1*, NF2*, NTHL1*, PALB2*, PDGFRA, PHOX2B, PMS2*, POLD1*, POLE*, POT1*, PRKAR1A*, PTCH1*, PTEN*, RAD50*, RAD51C*, RAD51D*, RB1*, RECQL4, RET, RUNX1*, SDHA*, SDHAF2*, SDHB*, SDHC*, SDHD*, SMAD4*, SMARCA4*, SMARCB1*, SMARCE1*, STK11*, SUFU*, TERC, TERT, TMEM127*, Tp53*, TSC1*, TSC2*, VHL*, WRN*, and WT1.  RNA analysis is performed for * genes.  Based on Ms. Seoane's personal and family history of cancer, she meets medical criteria for genetic testing. Despite that she meets criteria, there may still be an out of pocket cost. Ms. Mazzeo expressed concern regarding the possible cost of testing. Therefore, we will request an out of pocket estimate from [facility] before initiating the test.  PLAN: We will request an estimated out of pocket cost from [facility] for analysis of the Multi-Cancer + RNA panel. Once available, we will discuss the estimated cost of testing with Ms. Koike and she will determine whether she would like to proceed with testing. In the meantime, we recommend Ms. Monterrosa continue to follow the cancer screening and management guidelines given by her healthcare providers.  Ms. Dearinger questions were answered to her satisfaction today. Our contact information was provided should additional questions or concerns arise. Thank you for the referral and allowing  [place] to share in the care of your patient.   Clint Guy, [place], [facility] Licensed, Certified Dispensing [email or web address] Phone: [phone]  The patient was seen for a total of 35 minutes in face-to-face genetic counseling. Patient was seen with her daughter, Baker Janus. This patient was discussed with Drs. Magrinat, Lindi Adie and/or Burr Medico who agrees with the above.    _______________________________________________________________________ For Office Staff:  Number of people involved in session: 1 Was an Intern/ student involved with case: no

## 2021-05-22 ENCOUNTER — Encounter: Payer: Self-pay | Admitting: Genetic Counselor

## 2021-05-22 ENCOUNTER — Telehealth: Payer: Self-pay | Admitting: Genetic Counselor

## 2021-05-22 DIAGNOSIS — Z800 Family history of malignant neoplasm of digestive organs: Secondary | ICD-10-CM | POA: Insufficient documentation

## 2021-05-22 DIAGNOSIS — Z8041 Family history of malignant neoplasm of ovary: Secondary | ICD-10-CM | POA: Insufficient documentation

## 2021-05-22 DIAGNOSIS — Z803 Family history of malignant neoplasm of breast: Secondary | ICD-10-CM | POA: Insufficient documentation

## 2021-05-22 LAB — IRON AND TIBC
Iron: 60 ug/dL (ref 41–142)
Saturation Ratios: 18 % — ABNORMAL LOW (ref 21–57)
TIBC: 325 ug/dL (ref 236–444)
UIBC: 265 ug/dL (ref 120–384)

## 2021-05-22 LAB — FERRITIN: Ferritin: 178 ng/mL (ref 11–307)

## 2021-05-22 LAB — CEA (IN HOUSE-CHCC): CEA (CHCC-In House): 14.58 ng/mL — ABNORMAL HIGH (ref 0.00–5.00)

## 2021-05-22 NOTE — Telephone Encounter (Signed)
Discussed with Jaime [name] that the estimated out of pocket cost for genetic testing is $0. She is comfortable moving forward with testing at this time. We will call her in approximately two-three weeks once the results are available.

## 2021-05-25 NOTE — Progress Notes (Signed)
Jaime [name]   Telephone:([phone]) [phone] Fax:(336) [phone]   Clinic Follow up Note   Patient Care Team: Crist Infante, MD as PCP - General (Internal Medicine) Alla Feeling, NP as Nurse Practitioner (Nurse Practitioner) Truitt Merle, MD as Consulting Physician (Hematology) Otis Brace, MD as Referring Physician (Gastroenterology) 05/27/2021  CHIEF COMPLAINT: Follow up colon cancer   SUMMARY OF ONCOLOGIC HISTORY: Oncology History  Adenocarcinoma of colon metastatic to liver [facility])  06/2020 Miscellaneous   Patient well until + covid-19 in 06/2020 with DVT/PE at that time. Hospitalized for anticoagulation, discharged to SNF on eliquis. [facility] 08/2020. Subsequent pneumonia 10/2020 and RUL nodule new from 2017 warranting surveillance. Ongoing weight loss and worsening anemia 12/2020 Hg 7.7 heme + stool s/p RBC and IV Feraheme. CT 01/2021 showed mass in the cecum near ileocecal valve. Pt declined colonoscopy. Repeat CT 04/2021 showed enlarging colon mass and new liver masses. Liver bx confirmed metastatic colon adeno carcinoma.    11/20/2020 Imaging   CT chest with contrast -COVID-19 pneumonia follow-up  IMPRESSION: No findings suspicious for pneumonia. Lingular and right lower lobe opacities favor atelectasis.   Moderate right pleural effusion, increased. Trace left pleural effusion, new. No frank interstitial edema.   6 mm central right upper lobe nodule, unchanged from recent CT, but new from 2017. Follow-up CT chest is suggested in 6 months.   02/12/2021 Imaging   CT AP with contrast IMPRESSION: 1. Abnormal, focal wall thickening in the medial cecum, proximal to the ileocecal valve. Imaging features highly suspicious for neoplasm in light of the reported history of blood in stool. 2. No evidence for metastatic disease in the abdomen or pelvis. No ileocolic lymphadenopathy. 3. 8 mm common bile duct diameter in the head of the pancreas, upper normal for patient age. 4.  Stable right pleural effusion with collapse/consolidation in the right lower lobe comparing to CT chest 11/20/2020. 5. Left colonic diverticulosis without diverticulitis. 6. Aortic Atherosclerosis (ICD10-I70.0).   04/30/2021 Imaging   CT AP with contrast IMPRESSION: 1. Progressive infiltrative mass involving the cecum compared with previous CT 3 months ago, consistent with progressive colon cancer. 2. Interval development of several hypoattenuating liver lesions consistent with metastatic disease. No abdominal adenopathy or signs of peritoneal carcinomatosis. 3. Progressive intra and extrahepatic biliary dilatation of undetermined etiology. Given change from previous recent CT, correlation with liver function studies recommended. 4. Stable right pleural effusion and right basilar pulmonary opacity from recent prior studies. Of note, a right upper lobe pulmonary nodule was demonstrated on chest CT 11/20/2020 for which chest CT follow-up is recommended. That is potentially a metastasis. 5.  Aortic Atherosclerosis (ICD10-I70.0).   05/07/2021 Cancer Staging   Staging form: Colon and Rectum, AJCC 8th Edition - Clinical stage from 05/07/2021: Stage Unknown (cTX, cN0, pM1) - Signed by Alla Feeling, NP on 05/13/2021  Total positive nodes: 0  Laterality: Right  Sites of metastasis: Liver    05/07/2021 Initial Biopsy   Liver biopsy FINAL MICROSCOPIC DIAGNOSIS: A. LEFT LIVER LESION, NEEDLE CORE BIOPSY: - Adenocarcinoma.  ADDENDUM: Immunohistochemistry shows the adenocarcinoma is positive with cytokeratin 20 and CDX2 and is negative with cytokeratin 7, TTF-1 and Napsin A. The immunophenotype is consistent with metastatic colorectal adenocarcinoma.   05/13/2021 Initial Diagnosis   Adenocarcinoma of colon metastatic to liver ([place])      CURRENT THERAPY: PENDING first line single agent Xeloda 1000 mg AM/1500 mg PM for 1 week on/1 week off   INTERVAL HISTORY: Ms. Chacko returns for follow  up  as scheduled. She was last seen for initial visit on 6/22.  She has been doing well at home in the interim, with home care from 9-1 on Monday Wednesday Friday.  Her daughter remains very involved in her care.  She had 1 episode of low abdominal pain and an uncontrollable BM a few days ago, has not happened since.  She is eating and drinking, remains mostly independent at home.  Denies nausea/vomiting, worsening pain, or new concerns.  She and her daughter had questions about her cancer trajectory, home health versus residential nursing placement, and palliative care. They are interested in palliative care.     MEDICAL HISTORY:  Past Medical History:  Diagnosis Date   Arthritis    knees   Complication of anesthesia    problem 40 yrs ago Doctor told her anesthesia drugs caused a reaction, BP drops   Family history of breast cancer    Family history of ovarian cancer    Family history of pancreatic cancer    Hypertension     SURGICAL HISTORY: Past Surgical History:  Procedure Laterality Date   ABDOMINAL HYSTERECTOMY     CATARACT EXTRACTION W/PHACO Left 08/09/2018   Procedure: CATARACT EXTRACTION PHACO AND INTRAOCULAR LENS PLACEMENT ([place])  ISTENT AND ISTENT INJECT LEFT;  Surgeon: Leandrew Koyanagi, MD;  Location: [facility];  Service: Ophthalmology;  Laterality: Left;   CHOLECYSTECTOMY     EYE SURGERY     cataract with lens implant-right   INSERTION OF ANTERIOR SEGMENT AQUEOUS DRAINAGE DEVICE (ISTENT) Left 08/09/2018   Procedure: INSERTION OF ANTERIOR SEGMENT AQUEOUS DRAINAGE DEVICE (ISTENT);  Surgeon: Leandrew Koyanagi, MD;  Location: [facility];  Service: Ophthalmology;  Laterality: Left;   TOTAL KNEE ARTHROPLASTY Right 02/27/2014   Procedure: RIGHT TOTAL KNEE ARTHROPLASTY;  Surgeon: Tobi Bastos, MD;  Location: [facility];  Service: Orthopedics;  Laterality: Right;   TUBAL LIGATION      I have reviewed the social history and family history with the patient and  they are unchanged from previous note.  ALLERGIES:  is allergic to cefdinir, codeine, ibandronic acid, meperidine hcl, adhesive [tape], sulfa antibiotics, and sulfonamide derivatives.  MEDICATIONS:  Current Outpatient Medications  Medication Sig Dispense Refill   famotidine (PEPCID) 20 MG tablet Take 1 tablet (20 mg total) by mouth daily. 30 tablet 1   ondansetron (ZOFRAN) 8 MG tablet Take 1 tablet (8 mg total) by mouth every 8 (eight) hours as needed for nausea or vomiting. 20 tablet 1   acetaminophen (TYLENOL) 500 MG tablet Take 500 mg by mouth every 6 (six) hours as needed for moderate pain or headache.     albuterol (VENTOLIN HFA) 108 (90 Base) MCG/ACT inhaler Inhale 2 puffs into the lungs every 4 (four) hours as needed for wheezing or shortness of breath.     brimonidine (ALPHAGAN) 0.2 % ophthalmic solution Place 1 drop into the left eye 2 (two) times daily.     buPROPion (WELLBUTRIN XL) 150 MG 24 hr tablet Take 150 mg by mouth daily.      capecitabine (XELODA) 500 MG tablet Take 2 tablets by mouth in morning and 3 tablets by mouth in evening every 12 hours. Take for 7 days on and 7 days off. Take within 30 minutes after meals. 35 tablet 1   cholecalciferol (VITAMIN D3) 25 MCG (1000 UNIT) tablet Take 1,000 Units by mouth daily.     dorzolamide-timolol (COSOPT) 22.3-6.8 MG/ML ophthalmic solution Place 1 drop into both eyes 2 (two) times daily.  escitalopram (LEXAPRO) 5 MG tablet Take 5 mg by mouth daily.     Fe Fum-FePoly-Vit C-Vit B3 (INTEGRA) 62.5-62.5-40-3 MG CAPS Take 1 capsule by mouth daily.     fexofenadine (ALLEGRA) 180 MG tablet Take 180 mg by mouth daily as needed for allergies or rhinitis.     fluticasone (FLONASE) 50 MCG/ACT nasal spray Place 1 spray into both nostrils daily as needed for allergies or rhinitis.     furosemide (LASIX) 20 MG tablet Take one tablet (47m total) by mouth every other day, alternating with two tablets (442mtotal) by mouth every other day. (Patient  taking differently: Take 20-40 mg by mouth See admin instructions. Take one tablet (2025motal) by mouth every other day, alternating with two tablets (75m91mtal) by mouth every other day.) 132 tablet 3   Guaifenesin (MUCINEX MAXIMUM STRENGTH) 1200 MG TB12 Take 1,200 mg by mouth 2 (two) times daily as needed (congestion).     KLOR-CON M20 20 MEQ tablet Take 10 mEq by mouth every Monday, Wednesday, and Friday.     latanoprost (XALATAN) 0.005 % ophthalmic solution Place 1 drop into both eyes at bedtime.     loperamide (IMODIUM A-D) 2 MG tablet Take 2-4 mg by mouth 4 (four) times daily as needed for diarrhea or loose stools.     Multiple Vitamins-Minerals (ADULT GUMMY PO) Take 2 capsules by mouth daily.     rosuvastatin (CRESTOR) 10 MG tablet Take 10 mg by mouth daily.     vitamin C (ASCORBIC ACID) 500 MG tablet Take 500 mg by mouth daily.     No current facility-administered medications for this visit.    PHYSICAL EXAMINATION: ECOG PERFORMANCE STATUS: 1 - Symptomatic but completely ambulatory  Vitals:   05/27/21 1307  BP: (!) 143/54  Pulse: 62  Resp: 18  Temp: 97.8 F (36.6 C)  SpO2: 99%   Filed Weights   05/27/21 1307  Weight: 179 lb 6.4 oz (81.4 kg)    GENERAL:alert, no distress and comfortable SKIN: no rash  EYES: sclera clear LUNGS: clear with normal breathing effort HEART: regular rate & rhythm, no lower extremity edema ABDOMEN:abdomen soft, non-tender and normal bowel sounds NEURO: alert & oriented x 3 with fluent speech, exam performed in wheelchair   LABORATORY DATA:  I have reviewed the data as listed CBC Latest Ref Rng & Units 05/21/2021 05/07/2021 01/08/2021  WBC 4.0 - 10.5 K/uL 6.9 8.9 -  Hemoglobin 12.0 - 15.0 g/dL 11.7(L) 11.5(L) 9.3(L)  Hematocrit 36.0 - 46.0 % 36.9 36.9 32.5(L)  Platelets 150 - 400 K/uL 338 290 -     CMP Latest Ref Rng & Units 05/21/2021 12/30/2020 09/13/2020  Glucose 70 - 99 mg/dL 92 89 113(H)  BUN 8 - 23 mg/dL _0 Creatinine 0.44 -  1.00 mg/dL 0.97 0.96 1.00  Sodium 135 - 145 mmol/L 141 144 138  Potassium 3.5 - 5.1 mmol/L 4.1 5.2 4.3  Chloride 98 - 111 mmol/L 104 109(H) 109  CO2 22 - 32 mmol/L 29 23 21(L)  Calcium 8.9 - 10.3 mg/dL 9.9 9.2 9.4  Total Protein 6.5 - 8.1 g/dL 8.0 - -  Total Bilirubin 0.3 - 1.2 mg/dL 0.5 - -  Alkaline Phos 38 - 126 U/L 67 - -  AST 15 - 41 U/L 21 - -  ALT 0 - 44 U/L 15 - -      RADIOGRAPHIC STUDIES: I have personally reviewed the radiological images as listed and agreed with the findings in the  report. No results found.   ASSESSMENT & PLAN: 87 yo female   1.Adenocarcinoma of cecum with liver metastasis, stage IV, MMR and FO pending -she presented with IDA, found to have cecum mass on imaging, colonoscopy was declined. After 3 months, she was found to have enlarging cecal mass and new liver masses. Liver biopsy confirmed metastatic colon adenocarcinoma -we discussed this is stage IV cancer, she is not a surgical candidate for resection and therefore not curable at this stage, but still treatable -she values comfort and quality of life over prolonging her life. She is interested to see if she is a candidate for immunotherapy, molecular studies are pending. -she consented to low intensity single agent Xeloda, her PS is good. She can start when supply is delievered ~7/11. Will notify with exact start date.  -lab and f/up 2 weeks after starting Xeloda    2. Low appetite, weight loss -secondary to #1 -she has low appetite and lost 28 lbs in 1 year, some of which is related to covid-19 illness. She lost 8 lbs this month -we reviewed supplements, nutrition, hydration. -we discussed appetite stimulant such as mirtazapine. She prefers to hold for now -will refer to [facility] nutritionist -She has occasional lower abdominal cramping prior to BM, no current signs of bowel obstruction -gained some weight recently    3. IDA, secondary to #1 -she developed mild anemia Hg 10.6 in 08/2020, after  covid-infection, started oral iron -Hg worsened to 7.7 12/30/20 when she was found to have heme +stool. S/p RBC transfusion, IV Feraheme 03/20/21 and 03/27/21, and continues oral iron -eliquis was stopped. -CT 01/2021 showed mass in the cecum, pt declined colonoscopy -anemia improved, Hg 11.5 on 05/07/21 -Ferritin 178 on 05/21/2021 -Continue oral iron   4. COVID-19 infection, DVT/PE, and subsequent pneumonia -She presented to ED in 06/2020 with dyspnea, work up showed COVID-19 + and saddle PE. She was admitted for management, d/c'd on eliquis to SNF -subsequently developed pneumonia in 10/2020, recovered -requires walker at home, some assistance to stand from wheelchair today but does not need assistance at home   5. Genetics -due to her personal history of colon cancer, and family h/o ovarian and breast cancer, she qualifies for genetic testing to r/o inheritable cancer syndrome -I reviewed with her daughter she should comply with age appropriate cancer screenings including annual mammogram, colonoscopy per GI, and gyn screenings. She understands -Genetic panel obtained 05/21/2021   6. Goals of care -We reviewed the aggressive nature of her disease, given the new liver metastasis in 3 months.  She understands her metastatic cancer is not curable but treatable, the goal is palliative -We previously discussed prognosis and life expectancy, with and without treatment -Patient is DNR, daughter [name] is only child and has medical HCPOA, she will bring a copy at next visit -Pt's duaghter is second grade teacher, lives close but still works.  Transportation may be an issue in the future, will refer to social work if needed  Disposition: Ms. Wenz appears stable.  She is ready to begin Xeloda, pharmacy will arrange delivery to her home.  I have asked chemo education nurse to do virtual teaching in the next day or 2.  Anticipate start date to be 7/11, but patient's daughter will call me to confirm the  start date.  We reviewed potential benefit, side effects, symptom management, and rationale of treatment.  Molecular studies are pending. I indicated Dr. Burr Medico plans to recommend adding either bevacizumab or pembrolizumab pending the results.  Patient is interested in single agent Xeloda for now.   Labs 05/21/21 adequate to begin Xeloda 1000 mg AM/1500 mg PM 2 weeks on/1 week off starting 7/11 or with delivery. I recommend to change protonix to pepcid, new Rx sent.   We will see her back 2 weeks after start date with lab.   She is being referred to [facility] palliative care.   Orders Placed This Encounter  Procedures   Amb Referral to Palliative Care    Referral Priority:   Routine    Referral Type:   Consultation    Referral Reason:   Symptom Managment    Number of Visits Requested:   1   All questions were answered. The patient knows to call the clinic with any problems, questions or concerns. No barriers to learning were detected. Total encounter time was 30 minutes.      Alla Feeling, NP 05/27/21

## 2021-05-26 ENCOUNTER — Telehealth: Payer: Self-pay

## 2021-05-26 NOTE — Telephone Encounter (Signed)
-----   Message from Alla Feeling, NP sent at 05/25/2021  7:46 PM EDT ----- Please let her know labs. Mild anemia is stable, no iron deficiency. CMP all good. CEA is elevated, which we will monitor on chemo.  Thanks, Regan Rakers, NP

## 2021-05-26 NOTE — Telephone Encounter (Signed)
Patient has been made aware of lab results and verbalized understanding.  

## 2021-05-27 ENCOUNTER — Encounter: Payer: Self-pay | Admitting: Nurse Practitioner

## 2021-05-27 ENCOUNTER — Telehealth: Payer: Self-pay

## 2021-05-27 ENCOUNTER — Other Ambulatory Visit: Payer: Self-pay

## 2021-05-27 ENCOUNTER — Inpatient Hospital Stay: Payer: Medicare Other | Attending: Nurse Practitioner | Admitting: Nurse Practitioner

## 2021-05-27 ENCOUNTER — Other Ambulatory Visit (HOSPITAL_COMMUNITY): Payer: Self-pay

## 2021-05-27 VITALS — BP 143/54 | HR 62 | Temp 97.8°F | Resp 18 | Ht 65.0 in | Wt 179.4 lb

## 2021-05-27 DIAGNOSIS — C189 Malignant neoplasm of colon, unspecified: Secondary | ICD-10-CM | POA: Diagnosis not present

## 2021-05-27 DIAGNOSIS — Z8616 Personal history of COVID-19: Secondary | ICD-10-CM | POA: Diagnosis not present

## 2021-05-27 DIAGNOSIS — Z86711 Personal history of pulmonary embolism: Secondary | ICD-10-CM | POA: Insufficient documentation

## 2021-05-27 DIAGNOSIS — D509 Iron deficiency anemia, unspecified: Secondary | ICD-10-CM | POA: Diagnosis not present

## 2021-05-27 DIAGNOSIS — Z7901 Long term (current) use of anticoagulants: Secondary | ICD-10-CM | POA: Diagnosis not present

## 2021-05-27 DIAGNOSIS — C787 Secondary malignant neoplasm of liver and intrahepatic bile duct: Secondary | ICD-10-CM

## 2021-05-27 DIAGNOSIS — C180 Malignant neoplasm of cecum: Secondary | ICD-10-CM | POA: Diagnosis not present

## 2021-05-27 DIAGNOSIS — Z86718 Personal history of other venous thrombosis and embolism: Secondary | ICD-10-CM | POA: Insufficient documentation

## 2021-05-27 DIAGNOSIS — Z66 Do not resuscitate: Secondary | ICD-10-CM | POA: Diagnosis not present

## 2021-05-27 DIAGNOSIS — R634 Abnormal weight loss: Secondary | ICD-10-CM | POA: Insufficient documentation

## 2021-05-27 MED ORDER — CAPECITABINE 500 MG PO TABS
ORAL_TABLET | ORAL | 1 refills | Status: DC
Start: 2021-05-27 — End: 2021-06-09
  Filled 2021-05-27: qty 35, fill #0
  Filled 2021-05-27: qty 35, 14d supply, fill #0
  Filled 2021-06-08: qty 35, 14d supply, fill #1

## 2021-05-27 MED ORDER — FAMOTIDINE 20 MG PO TABS: 20.0000 mg | ORAL_TABLET | Freq: Every day | ORAL | 1 refills | Status: DC

## 2021-05-27 MED ORDER — ONDANSETRON HCL 8 MG PO TABS: 8.0000 mg | ORAL_TABLET | Freq: Three times a day (TID) | ORAL | 1 refills | Status: AC | PRN

## 2021-05-27 NOTE — Telephone Encounter (Signed)
Oral Oncology Patient Advocate Encounter  Met patient in lobby room to complete application for [place] in an effort to reduce patient's out of pocket expense for Xeloda to $0.    Application completed and faxed to [phone].   [employer] patient assistance phone number for follow up is [phone].   This encounter will be updated until final determination.   [place] Patient [place] Phone [phone] Fax [phone] 05/27/2021 1:47 PM

## 2021-05-27 NOTE — Telephone Encounter (Signed)
Oral Chemotherapy Pharmacist Encounter  I spoke with patient and her daughter for overview of: Xeloda (capecitabine) for the  treatment of metastatic colon cancer cancer, planned duration until disease progression or unacceptable drug toxicity.  Counseled patient on administration, dosing, side effects, monitoring, drug-food interactions, safe handling, storage, and disposal.  Patient will take Xeloda 500mg  tablets, 2 tablets (1000mg ) by mouth in AM and 3 tabs (1500mg ) by mouth in PM, within 30 minutes of finishing meals, for 7 days on, 7 days off, repeated every 14 days.  Xeloda start date: 06/01/21  Adverse effects include but are not limited to: fatigue, decreased blood counts, GI upset, diarrhea, mouth sores, and hand-foot syndrome.  Patient will obtain anti diarrheal and alert the office of 4 or more loose stools above baseline.  Reviewed with patient importance of keeping a medication schedule and plan for any missed doses. No barriers to medication adherence identified.  Medication reconciliation performed and medication/allergy list updated.  This will ship from the [facility] on 05/27/21 to deliver to patient's home on 05/28/21.  Patient informed the pharmacy will reach out 5-7 days prior to needing next fill of Xeloda to coordinate continued medication acquisition to prevent break in therapy.  All questions answered.  Jaime [name] voiced understanding and appreciation.   Medication education handout placed in mail for patient. Patient knows to call the office with questions or concerns. Oral Chemotherapy Clinic phone number provided to patient.   Leron Croak, PharmD, BCPS Hematology/Oncology Clinical Pharmacist [facility] [phone] 05/27/2021 3:35 PM

## 2021-05-28 ENCOUNTER — Telehealth: Payer: Self-pay

## 2021-05-28 ENCOUNTER — Telehealth: Payer: Self-pay | Admitting: Hematology

## 2021-05-28 NOTE — Telephone Encounter (Signed)
Attempted to contact patient's daughter Baker Janus to schedule a Palliative Care consult appointment. No answer left a message to return call.

## 2021-05-28 NOTE — Telephone Encounter (Signed)
Patient called in stating that she needs to know how big the Xeloda tablets are.  Patient is concerned stating that she has trouble swallowing large pills.  This nurse advised that Xeloda is not a really large pill however they can be dissolved in water or a fruit juice of her choice.  Patient states that she  would like to use apple sauce.  Advised that will be fine.  No No further questions or concerns at this time.

## 2021-05-28 NOTE — Telephone Encounter (Signed)
Scheduled follow-up appointment per 7/6 los. Patient is aware. 

## 2021-05-29 ENCOUNTER — Other Ambulatory Visit: Payer: Self-pay

## 2021-05-29 ENCOUNTER — Inpatient Hospital Stay: Payer: Medicare Other

## 2021-05-29 ENCOUNTER — Telehealth: Payer: Self-pay

## 2021-05-29 ENCOUNTER — Other Ambulatory Visit (HOSPITAL_COMMUNITY): Payer: Self-pay

## 2021-05-29 NOTE — Telephone Encounter (Signed)
Per Cira Rue, NP I contacted [facility] and they have advised me that they are currently working on getting the patient enrolled into palliative care. I also followed up with [facility] and they have advised me that the CDX is still currently in process expected to release 06/03/21 Cira Rue, NP notified).

## 2021-05-29 NOTE — Telephone Encounter (Signed)
-----   Message from Alla Feeling, NP sent at 05/27/2021  2:38 PM EDT ----- Myriam Jacobson,  Could you please follow up on [facility] results in pathology and refer her to [facility] palliative care only (not hospice).  Thanks, Regan Rakers

## 2021-05-30 DIAGNOSIS — C189 Malignant neoplasm of colon, unspecified: Secondary | ICD-10-CM | POA: Diagnosis not present

## 2021-06-01 ENCOUNTER — Telehealth: Payer: Self-pay

## 2021-06-01 NOTE — Telephone Encounter (Signed)
Spoke with patient's daughter Jamylah and scheduled an in-person Palliative Consult for 07/01/21 @ 12:30PM.  COVID screening was negative. No pets in home. Patient lives alone.  Consent obtained; updated Outlook/Netsmart/Team List and Epic.   Family is aware they may be receiving a call from NP the day before or day of to confirm appointment.

## 2021-06-02 ENCOUNTER — Encounter (HOSPITAL_COMMUNITY): Payer: Self-pay

## 2021-06-04 ENCOUNTER — Telehealth: Payer: Self-pay | Admitting: *Deleted

## 2021-06-04 ENCOUNTER — Telehealth: Payer: Self-pay | Admitting: Nurse Practitioner

## 2021-06-04 ENCOUNTER — Other Ambulatory Visit (HOSPITAL_COMMUNITY): Payer: Self-pay

## 2021-06-04 LAB — SURGICAL PATHOLOGY

## 2021-06-04 NOTE — Telephone Encounter (Signed)
Scheduled appointment per 07/14 sch msg. Patient is aware. 

## 2021-06-04 NOTE — Telephone Encounter (Signed)
Per Ms. Kalman Shan, NP: Contacted patient to determine start date for Xeloda -- Send schedule message for lab and f/up  weeks from that date with Ms. Burton or Dr. Burr Medico.  Xeloda started on 06/01/21 - Ms. Burton informed. Sent schedule message as requested for lab and f/up with Ms. Burton or Dr. Burr Medico 2 weeks from that date.   Patient informed that scheduler will call to schedule appt in 2 weeks.

## 2021-06-06 DIAGNOSIS — Z1379 Encounter for other screening for genetic and chromosomal anomalies: Secondary | ICD-10-CM | POA: Insufficient documentation

## 2021-06-08 ENCOUNTER — Ambulatory Visit: Payer: Self-pay | Admitting: Genetic Counselor

## 2021-06-08 ENCOUNTER — Other Ambulatory Visit (HOSPITAL_COMMUNITY): Payer: Self-pay

## 2021-06-08 ENCOUNTER — Telehealth: Payer: Self-pay | Admitting: Genetic Counselor

## 2021-06-08 ENCOUNTER — Encounter: Payer: Self-pay | Admitting: Genetic Counselor

## 2021-06-08 DIAGNOSIS — Z1379 Encounter for other screening for genetic and chromosomal anomalies: Secondary | ICD-10-CM

## 2021-06-08 NOTE — Progress Notes (Signed)
HPI:  Ms. Kallen was previously seen in the [place] clinic due to a personal and family history of cancer and concerns regarding a hereditary predisposition to cancer. Please refer to our prior cancer genetics clinic note for more information regarding our discussion, assessment and recommendations, at the time. Ms. Surprenant recent genetic test results were disclosed to her, as were recommendations warranted by these results. These results and recommendations are discussed in more detail below.  CANCER HISTORY:  Oncology History  Adenocarcinoma of colon metastatic to liver [facility])  06/2020 Miscellaneous   Patient well until + covid-19 in 06/2020 with DVT/PE at that time. Hospitalized for anticoagulation, discharged to SNF on eliquis. [facility] 08/2020. Subsequent pneumonia 10/2020 and RUL nodule new from 2017 warranting surveillance. Ongoing weight loss and worsening anemia 12/2020 Hg 7.7 heme + stool s/p RBC and IV Feraheme. CT 01/2021 showed mass in the cecum near ileocecal valve. Pt declined colonoscopy. Repeat CT 04/2021 showed enlarging colon mass and new liver masses. Liver bx confirmed metastatic colon adeno carcinoma.    11/20/2020 Imaging   CT chest with contrast -COVID-19 pneumonia follow-up  IMPRESSION: No findings suspicious for pneumonia. Lingular and right lower lobe opacities favor atelectasis.   Moderate right pleural effusion, increased. Trace left pleural effusion, new. No frank interstitial edema.   6 mm central right upper lobe nodule, unchanged from recent CT, but new from 2017. Follow-up CT chest is suggested in 6 months.   02/12/2021 Imaging   CT AP with contrast IMPRESSION: 1. Abnormal, focal wall thickening in the medial cecum, proximal to the ileocecal valve. Imaging features highly suspicious for neoplasm in light of the reported history of blood in stool. 2. No evidence for metastatic disease in the abdomen or pelvis. No ileocolic lymphadenopathy. 3. 8 mm  common bile duct diameter in the head of the pancreas, upper normal for patient age. 4. Stable right pleural effusion with collapse/consolidation in the right lower lobe comparing to CT chest 11/20/2020. 5. Left colonic diverticulosis without diverticulitis. 6. Aortic Atherosclerosis (ICD10-I70.0).   04/30/2021 Imaging   CT AP with contrast IMPRESSION: 1. Progressive infiltrative mass involving the cecum compared with previous CT 3 months ago, consistent with progressive colon cancer. 2. Interval development of several hypoattenuating liver lesions consistent with metastatic disease. No abdominal adenopathy or signs of peritoneal carcinomatosis. 3. Progressive intra and extrahepatic biliary dilatation of undetermined etiology. Given change from previous recent CT, correlation with liver function studies recommended. 4. Stable right pleural effusion and right basilar pulmonary opacity from recent prior studies. Of note, a right upper lobe pulmonary nodule was demonstrated on chest CT 11/20/2020 for which chest CT follow-up is recommended. That is potentially a metastasis. 5.  Aortic Atherosclerosis (ICD10-I70.0).   05/07/2021 Cancer Staging   Staging form: Colon and Rectum, AJCC 8th Edition - Clinical stage from 05/07/2021: Stage Unknown (cTX, cN0, pM1) - Signed by Alla Feeling, NP on 05/13/2021  Total positive nodes: 0  Laterality: Right  Sites of metastasis: Liver    05/07/2021 Initial Biopsy   Liver biopsy FINAL MICROSCOPIC DIAGNOSIS: A. LEFT LIVER LESION, NEEDLE CORE BIOPSY: - Adenocarcinoma.  ADDENDUM: Immunohistochemistry shows the adenocarcinoma is positive with cytokeratin 20 and CDX2 and is negative with cytokeratin 7, TTF-1 and Napsin A. The immunophenotype is consistent with metastatic colorectal adenocarcinoma.   05/13/2021 Initial Diagnosis   Adenocarcinoma of colon metastatic to liver ([place])    06/06/2021 Genetic Testing   Negative genetic testing:  No  pathogenic variants detected on the  [facility] Multi-Cancer + RNA panel. The report date is 06/06/2021.   The Multi-Cancer + RNA Panel offered by [facility] includes sequencing and/or deletion/duplication analysis of the following 84 genes:  AIP*, ALK, APC*, ATM*, AXIN2*, BAP1*, BARD1*, BLM*, BMPR1A*, BRCA1*, BRCA2*, BRIP1*, CASR, CDC73*, CDH1*, CDK4, CDKN1B*, CDKN1C*, CDKN2A, CEBPA, CHEK2*, CTNNA1*, DICER1*, DIS3L2*, EGFR, EPCAM, FH*, FLCN*, GATA2*, GPC3, GREM1, HOXB13, HRAS, KIT, MAX*, MEN1*, MET, MITF, MLH1*, MSH2*, MSH3*, MSH6*, MUTYH*, NBN*, NF1*, NF2*, NTHL1*, PALB2*, PDGFRA, PHOX2B, PMS2*, POLD1*, POLE*, POT1*, PRKAR1A*, PTCH1*, PTEN*, RAD50*, RAD51C*, RAD51D*, RB1*, RECQL4, RET, RUNX1*, SDHA*, SDHAF2*, SDHB*, SDHC*, SDHD*, SMAD4*, SMARCA4*, SMARCB1*, SMARCE1*, STK11*, SUFU*, TERC, TERT, TMEM127*, Tp53*, TSC1*, TSC2*, VHL*, WRN*, and WT1.  RNA analysis is performed for * genes.     FAMILY HISTORY:  We obtained a detailed, 4-generation family history.  Significant diagnoses are listed below: Family History  Problem Relation Age of Onset   Ovarian cancer Mother 74       metastatic to colon   Heart attack Father    Aneurysm Sister        abdominal   Alzheimer's disease Sister    Breast cancer Maternal Aunt        dx >50   Breast cancer Cousin        dx >50, maternal cousin   Pancreatic cancer Cousin        dx >98s, maternal cousin   Cancer Cousin        unknown type, dx 3s, maternal cousin   Ms. Brotz has one daughter Baker Janus, age 104). She had two sisters - one died at age 31 and the other is currently alive at age 38. None of these relatives have had cancer.   Ms. Robinson mother died at age 72 from ovarian cancer that metastasized to the colon (diagnosed age 1). There were seven maternal aunts and two maternal uncles. One aunt had breast cancer (diagnosed older than 55). Three cousins had cancer - one had breast cancer older than 19, one had pancreatic cancer in his 33s, and the third had an  unknown cancer in her 45s. Ms. Glauser maternal grandmother died in her 73s without cancer. Her maternal grandfather died younger than 104 without cancer.   Ms. Wysocki's father died at age 94 without cancer. There were three paternal aunts and one paternal uncle. There is no known cancer among paternal aunts/uncles or paternal cousins. Ms. Hack has limited information about her paternal grandparents.    Ms. Navejas is unaware of previous family history of genetic testing for hereditary cancer risks. Patient's maternal ancestors are of English descent, and paternal ancestors are of unknown descent. There is no reported Ashkenazi Jewish ancestry. There is no known consanguinity.  GENETIC TEST RESULTS: Genetic testing reported out on 06/06/2021 through the [facility] Multi-Cancer + RNA panel. No pathogenic variants were detected.   The Multi-Cancer + RNA Panel offered by [facility] includes sequencing and/or deletion/duplication analysis of the following 84 genes:  AIP*, ALK, APC*, ATM*, AXIN2*, BAP1*, BARD1*, BLM*, BMPR1A*, BRCA1*, BRCA2*, BRIP1*, CASR, CDC73*, CDH1*, CDK4, CDKN1B*, CDKN1C*, CDKN2A, CEBPA, CHEK2*, CTNNA1*, DICER1*, DIS3L2*, EGFR, EPCAM, FH*, FLCN*, GATA2*, GPC3, GREM1, HOXB13, HRAS, KIT, MAX*, MEN1*, MET, MITF, MLH1*, MSH2*, MSH3*, MSH6*, MUTYH*, NBN*, NF1*, NF2*, NTHL1*, PALB2*, PDGFRA, PHOX2B, PMS2*, POLD1*, POLE*, POT1*, PRKAR1A*, PTCH1*, PTEN*, RAD50*, RAD51C*, RAD51D*, RB1*, RECQL4, RET, RUNX1*, SDHA*, SDHAF2*, SDHB*, SDHC*, SDHD*, SMAD4*, SMARCA4*, SMARCB1*, SMARCE1*, STK11*, SUFU*, TERC, TERT, TMEM127*, Tp53*, TSC1*, TSC2*, VHL*, WRN*, and WT1.  RNA analysis is performed for * genes. The test  report will be scanned into EPIC and located under the Molecular Pathology section of the Results Review tab.  A portion of the result report is included below for reference.     We discussed with Ms. Voisin that because current genetic testing is not perfect, it is possible there may be a gene mutation  in one of these genes that current testing cannot detect, but that chance is small.  We also discussed that there could be another gene that has not yet been discovered, or that we have not yet tested, that is responsible for the cancer diagnoses in the family. It is also possible there is a hereditary cause for the cancer in the family that Ms. Threats did not inherit and therefore was not identified in her testing.  Therefore, it is important to remain in touch with cancer genetics in the future so that we can continue to offer Ms. Eddings the most up to date genetic testing.   CANCER SCREENING RECOMMENDATIONS: Ms. Selle test result is considered negative (normal).  This means that we have not identified a hereditary cause for her personal and family history of cancer at this time. Most cancers happen by chance and this negative test suggests that her personal history of cancer may fall into this category.    While reassuring, this does not definitively rule out a hereditary predisposition to cancer. It is still possible that there could be genetic mutations that are undetectable by current technology. There could be genetic mutations in genes that have not been tested or identified to increase cancer risk.  Therefore, it is recommended she continue to follow the cancer management and screening guidelines provided by her oncology and primary healthcare provider.   An individual's cancer risk and medical management are not determined by genetic test results alone. Overall cancer risk assessment incorporates additional factors, including personal medical history, family history, and any available genetic information that may result in a personalized plan for cancer prevention and surveillance.  RECOMMENDATIONS FOR FAMILY MEMBERS:  Individuals in this family might be at some increased risk of developing cancer, over the general population risk, simply due to the family history of cancer.  We recommended women  in this family have a yearly mammogram beginning at age 75, or 61 years younger than the earliest onset of cancer, an annual clinical breast exam, and perform monthly breast self-exams. Women in this family should also have a gynecological exam as recommended by their primary provider. All family members should be referred for colonoscopy starting at age 40.  FOLLOW-UP: Lastly, we discussed with Ms. Flow that cancer genetics is a rapidly advancing field and it is possible that new genetic tests will be appropriate for her and/or her family members in the future. We encouraged her to remain in contact with cancer genetics on an annual basis so we can update her personal and family histories and let her know of advances in cancer genetics that may benefit this family.   Our contact number was provided. Ms. Schuhmacher questions were answered to her satisfaction, and she knows she is welcome to call us at anytime with additional questions or concerns.   Clint Guy, MS, [facility] Genetic Counselor [email or web address] Phone: [phone]

## 2021-06-08 NOTE — Telephone Encounter (Signed)
Patient is approved for Xeloda at no cost from [employer] 06/03/21-until  [facility] uses [place] [facility] [place] Phone [phone] Fax [phone] 06/08/2021 10:38 AM

## 2021-06-08 NOTE — Telephone Encounter (Signed)
Revealed negative genetic testing. Discussed that we do not know why she has had cancer or why there is cancer in the family. There could be a genetic mutation in the family that Jaime [name] did not inherit. There could also be a mutation in a different gene that we are not testing, or our current technology may not be able to detect certain mutations. It will therefore be important for her to stay in contact with genetics to keep up with whether additional testing may be appropriate in the future.

## 2021-06-09 ENCOUNTER — Other Ambulatory Visit: Payer: Self-pay

## 2021-06-09 ENCOUNTER — Other Ambulatory Visit: Payer: Self-pay | Admitting: Hematology

## 2021-06-09 DIAGNOSIS — C787 Secondary malignant neoplasm of liver and intrahepatic bile duct: Secondary | ICD-10-CM

## 2021-06-09 DIAGNOSIS — C189 Malignant neoplasm of colon, unspecified: Secondary | ICD-10-CM

## 2021-06-09 MED ORDER — CAPECITABINE 500 MG PO TABS
ORAL_TABLET | ORAL | 1 refills | Status: DC
Start: 2021-06-09 — End: 2021-06-15

## 2021-06-09 NOTE — Telephone Encounter (Signed)
This nurse spoke with representative at [employer] who stated they needed a script sent for patients Xeloda.  This nurse sent in script.  No further questions or concerns at this time.

## 2021-06-14 NOTE — Progress Notes (Signed)
[facility]   [phone] [phone]   Clinic Follow up Note   Patient Care Team: Perini, Mark, MD as PCP - General (Internal Medicine) Burton, Lacie K, NP as Nurse Practitioner (Nurse Practitioner) Feng, Yan, MD as Consulting Physician (Hematology) Brahmbhatt, Parag, MD as Referring Physician (Gastroenterology)  Date of Service:  06/15/2021  CHIEF COMPLAINT: f/u of colon cancer  SUMMARY OF ONCOLOGIC HISTORY: Oncology History  Adenocarcinoma of colon metastatic to liver (HCC)  06/2020 Miscellaneous   Patient well until + covid-19 in 06/2020 with DVT/PE at that time. Hospitalized for anticoagulation, discharged to SNF on eliquis. Fell 08/2020. Subsequent pneumonia 10/2020 and RUL nodule new from 2017 warranting surveillance. Ongoing weight loss and worsening anemia 12/2020 Hg 7.7 heme + stool s/p RBC and IV Feraheme. CT 01/2021 showed mass in the cecum near ileocecal valve. Pt declined colonoscopy. Repeat CT 04/2021 showed enlarging colon mass and new liver masses. Liver bx confirmed metastatic colon adeno carcinoma.    11/20/2020 Imaging   CT chest with contrast -COVID-19 pneumonia follow-up  IMPRESSION: No findings suspicious for pneumonia. Lingular and right lower lobe opacities favor atelectasis.   Moderate right pleural effusion, increased. Trace left pleural effusion, new. No frank interstitial edema.   6 mm central right upper lobe nodule, unchanged from recent CT, but new from 2017. Follow-up CT chest is suggested in 6 months.   02/12/2021 Imaging   CT AP with contrast IMPRESSION: 1. Abnormal, focal wall thickening in the medial cecum, proximal to the ileocecal valve. Imaging features highly suspicious for neoplasm in light of the reported history of blood in stool. 2. No evidence for metastatic disease in the abdomen or pelvis. No ileocolic lymphadenopathy. 3. 8 mm common bile duct diameter in the head of the pancreas, upper normal for  patient age. 4. Stable right pleural effusion with collapse/consolidation in the right lower lobe comparing to CT chest 11/20/2020. 5. Left colonic diverticulosis without diverticulitis. 6. Aortic Atherosclerosis (ICD10-I70.0).   04/30/2021 Imaging   CT AP with contrast IMPRESSION: 1. Progressive infiltrative mass involving the cecum compared with previous CT 3 months ago, consistent with progressive colon cancer. 2. Interval development of several hypoattenuating liver lesions consistent with metastatic disease. No abdominal adenopathy or signs of peritoneal carcinomatosis. 3. Progressive intra and extrahepatic biliary dilatation of undetermined etiology. Given change from previous recent CT, correlation with liver function studies recommended. 4. Stable right pleural effusion and right basilar pulmonary opacity from recent prior studies. Of note, a right upper lobe pulmonary nodule was demonstrated on chest CT 11/20/2020 for which chest CT follow-up is recommended. That is potentially a metastasis. 5.  Aortic Atherosclerosis (ICD10-I70.0).   05/07/2021 Cancer Staging   Staging form: Colon and Rectum, AJCC 8th Edition - Clinical stage from 05/07/2021: Stage Unknown (cTX, cN0, pM1) - Signed by Burton, Lacie K, NP on 05/13/2021  Total positive nodes: 0  Laterality: Right  Sites of metastasis: Liver    05/07/2021 Initial Biopsy   Liver biopsy FINAL MICROSCOPIC DIAGNOSIS: A. LEFT LIVER LESION, NEEDLE CORE BIOPSY: - Adenocarcinoma.  ADDENDUM: Immunohistochemistry shows the adenocarcinoma is positive with cytokeratin 20 and CDX2 and is negative with cytokeratin 7, TTF-1 and Napsin A. The immunophenotype is consistent with metastatic colorectal adenocarcinoma. IHC Normal   05/07/2021 Miscellaneous   [facility]   Biomarker Findings -Microsatellite status - MS-Stable -Tumor Mutational Burden - 1 Muts/Mb  Genomic Findings -KRAS G12D -NRAS wildtype -APC R216 -ATM V455M -[id number]  R353   05/13/2021 Initial Diagnosis   Adenocarcinoma   of colon metastatic to liver ([place])    06/06/2021 Genetic Testing   Negative genetic testing:  No pathogenic variants detected on the [facility] Multi-Cancer + RNA panel. The report date is 06/06/2021.   The Multi-Cancer + RNA Panel offered by [facility] includes sequencing and/or deletion/duplication analysis of the following 84 genes:  AIP*, ALK, APC*, ATM*, AXIN2*, BAP1*, BARD1*, BLM*, BMPR1A*, BRCA1*, BRCA2*, BRIP1*, CASR, CDC73*, CDH1*, CDK4, CDKN1B*, CDKN1C*, CDKN2A, CEBPA, CHEK2*, CTNNA1*, DICER1*, DIS3L2*, EGFR, EPCAM, FH*, FLCN*, GATA2*, GPC3, GREM1, HOXB13, HRAS, KIT, MAX*, MEN1*, MET, MITF, MLH1*, MSH2*, MSH3*, MSH6*, MUTYH*, NBN*, NF1*, NF2*, NTHL1*, PALB2*, PDGFRA, PHOX2B, PMS2*, POLD1*, POLE*, POT1*, PRKAR1A*, PTCH1*, PTEN*, RAD50*, RAD51C*, RAD51D*, RB1*, RECQL4, RET, RUNX1*, SDHA*, SDHAF2*, SDHB*, SDHC*, SDHD*, SMAD4*, SMARCA4*, SMARCB1*, SMARCE1*, STK11*, SUFU*, TERC, TERT, TMEM127*, Tp53*, TSC1*, TSC2*, VHL*, WRN*, and WT1.  RNA analysis is performed for * genes.      CURRENT THERAPY:  first line single agent Xeloda 1000 mg AM/1500 mg PM for 1 week on/1 week off  INTERVAL HISTORY:  Jaime [name] is here for a follow up of colon cancer. She was last seen by me with NP Lacie in consultation on 05/13/21 and by NP Lacie on 05/27/21 in the interim. She presents to the clinic accompanied by her daughter. She reports she tolerated Xeloda well, with her only side effect being redness to the bottom of her feet. She notes she applied a cream to it, and the redness resolved. She notes pain prior to a bowel movement, which happen 1-2 times a day. She denies constipation. She describes the pain as being in her lower abdomen. She reports her stool is soft and can end up being diarrhea. She denies any blood in the stool. She notes the pain resolves following BM. She notes this has not significantly changed since starting chemo.   All other systems were  reviewed with the patient and are negative.  MEDICAL HISTORY:  Past Medical History:  Diagnosis Date   Arthritis    knees   Complication of anesthesia    problem 40 yrs ago Doctor told her anesthesia drugs caused a reaction, BP drops   Family history of breast cancer    Family history of ovarian cancer    Family history of pancreatic cancer    Hypertension     SURGICAL HISTORY: Past Surgical History:  Procedure Laterality Date   ABDOMINAL HYSTERECTOMY     CATARACT EXTRACTION W/PHACO Left 08/09/2018   Procedure: CATARACT EXTRACTION PHACO AND INTRAOCULAR LENS PLACEMENT ([place])  ISTENT AND ISTENT INJECT LEFT;  Surgeon: Leandrew Koyanagi, MD;  Location: [facility];  Service: Ophthalmology;  Laterality: Left;   CHOLECYSTECTOMY     EYE SURGERY     cataract with lens implant-right   INSERTION OF ANTERIOR SEGMENT AQUEOUS DRAINAGE DEVICE (ISTENT) Left 08/09/2018   Procedure: INSERTION OF ANTERIOR SEGMENT AQUEOUS DRAINAGE DEVICE (ISTENT);  Surgeon: Leandrew Koyanagi, MD;  Location: [facility];  Service: Ophthalmology;  Laterality: Left;   TOTAL KNEE ARTHROPLASTY Right 02/27/2014   Procedure: RIGHT TOTAL KNEE ARTHROPLASTY;  Surgeon: Tobi Bastos, MD;  Location: [facility];  Service: Orthopedics;  Laterality: Right;   TUBAL LIGATION      I have reviewed the social history and family history with the patient and they are unchanged from previous note.  ALLERGIES:  is allergic to cefdinir, codeine, ibandronic acid, meperidine hcl, adhesive [tape], sulfa antibiotics, and sulfonamide derivatives.  MEDICATIONS:  Current Outpatient Medications  Medication Sig Dispense Refill   acetaminophen (TYLENOL)  500 MG tablet Take 500 mg by mouth every 6 (six) hours as needed for moderate pain or headache.     albuterol (VENTOLIN HFA) 108 (90 Base) MCG/ACT inhaler Inhale 2 puffs into the lungs every 4 (four) hours as needed for wheezing or shortness of breath.     brimonidine (ALPHAGAN)  0.2 % ophthalmic solution Place 1 drop into the left eye 2 (two) times daily.     buPROPion (WELLBUTRIN XL) 150 MG 24 hr tablet Take 150 mg by mouth daily.      capecitabine (XELODA) 500 MG tablet Take 3 tablets in morning and 3 tablets in evening every 12 hours. Take for 7 days on and 7 days off. Take within 30 minutes after meals. 84 tablet 0   cholecalciferol (VITAMIN D3) 25 MCG (1000 UNIT) tablet Take 1,000 Units by mouth daily.     dorzolamide-timolol (COSOPT) 22.3-6.8 MG/ML ophthalmic solution Place 1 drop into both eyes 2 (two) times daily.     escitalopram (LEXAPRO) 5 MG tablet Take 5 mg by mouth daily.     famotidine (PEPCID) 20 MG tablet Take 1 tablet (20 mg total) by mouth daily. 30 tablet 1   Fe Fum-FePoly-Vit C-Vit B3 (INTEGRA) 62.5-62.5-40-3 MG CAPS Take 1 capsule by mouth daily.     fexofenadine (ALLEGRA) 180 MG tablet Take 180 mg by mouth daily as needed for allergies or rhinitis.     fluticasone (FLONASE) 50 MCG/ACT nasal spray Place 1 spray into both nostrils daily as needed for allergies or rhinitis.     furosemide (LASIX) 20 MG tablet Take one tablet (20mg total) by mouth every other day, alternating with two tablets (40mg total) by mouth every other day. (Patient taking differently: Take 20-40 mg by mouth See admin instructions. Take one tablet (20mg total) by mouth every other day, alternating with two tablets (40mg total) by mouth every other day.) 132 tablet 3   Guaifenesin (MUCINEX MAXIMUM STRENGTH) 1200 MG TB12 Take 1,200 mg by mouth 2 (two) times daily as needed (congestion).     KLOR-CON M20 20 MEQ tablet Take 10 mEq by mouth every Monday, Wednesday, and Friday.     latanoprost (XALATAN) 0.005 % ophthalmic solution Place 1 drop into both eyes at bedtime.     loperamide (IMODIUM A-D) 2 MG tablet Take 2-4 mg by mouth 4 (four) times daily as needed for diarrhea or loose stools.     Multiple Vitamins-Minerals (ADULT GUMMY PO) Take 2 capsules by mouth daily.     ondansetron  (ZOFRAN) 8 MG tablet Take 1 tablet (8 mg total) by mouth every 8 (eight) hours as needed for nausea or vomiting. 20 tablet 1   rosuvastatin (CRESTOR) 10 MG tablet Take 10 mg by mouth daily.     vitamin C (ASCORBIC ACID) 500 MG tablet Take 500 mg by mouth daily.     No current facility-administered medications for this visit.    PHYSICAL EXAMINATION: ECOG PERFORMANCE STATUS: 1 - Symptomatic but completely ambulatory  Vitals:   06/15/21 0915  BP: (!) 156/58  Pulse: 62  Resp: 20  Temp: 98.3 F (36.8 C)  SpO2: 100%   Filed Weights   06/15/21 0915  Weight: 180 lb 12.8 oz (82 kg)    Due to COVID19 we will limit examination to appearance. Patient had no complaints.  GENERAL:alert, no distress and comfortable SKIN: skin color normal, no rashes or significant lesions EYES: normal, Conjunctiva are pink and non-injected, sclera clear  NEURO: alert & oriented x 3   with fluent speech  LABORATORY DATA:  I have reviewed the data as listed CBC Latest Ref Rng & Units 06/15/2021 05/21/2021 05/07/2021  WBC 4.0 - 10.5 K/uL 7.5 6.9 8.9  Hemoglobin 12.0 - 15.0 g/dL 10.9(L) 11.7(L) 11.5(L)  Hematocrit 36.0 - 46.0 % 34.1(L) 36.9 36.9  Platelets 150 - 400 K/uL 290 338 290     CMP Latest Ref Rng & Units 06/15/2021 05/21/2021 12/30/2020  Glucose 70 - 99 mg/dL 95 92 89  BUN 8 - 23 mg/dL 22 22 20  Creatinine 0.44 - 1.00 mg/dL 0.89 0.97 0.96  Sodium 135 - 145 mmol/L 143 141 144  Potassium 3.5 - 5.1 mmol/L 4.5 4.1 5.2  Chloride 98 - 111 mmol/L 109 104 109(H)  CO2 22 - 32 mmol/L 27 29 23  Calcium 8.9 - 10.3 mg/dL 9.5 9.9 9.2  Total Protein 6.5 - 8.1 g/dL 6.8 8.0 -  Total Bilirubin 0.3 - 1.2 mg/dL 0.4 0.5 -  Alkaline Phos 38 - 126 U/L 59 67 -  AST 15 - 41 U/L 17 21 -  ALT 0 - 44 U/L 9 15 -    RADIOGRAPHIC STUDIES: I have personally reviewed the radiological images as listed and agreed with the findings in the report. No results found.   ASSESSMENT & PLAN:  Dania H Toledo is a 88 y.o. female with    1.Adenocarcinoma of cecum with liver metastasis, stage IV, MMR normal, KRAD G12D (+)  -she presented with IDA, found to have cecum mass on imaging, colonoscopy was declined. After 3 months, she was found to have enlarging cecal mass and new liver masses. Liver biopsy confirmed metastatic colon adenocarcinoma -we discussed this is stage IV cancer, she is not a surgical candidate for resection and therefore not curable at this stage, but still treatable -she values comfort and quality of life over prolonging her life. She is interested to see if she is a candidate for immunotherapy. IHC was normal. -We agreed to proceed with single agent Xeloda, which she started 06/01/21. She began cycle 2 today (06/15/21). We discussed increasing her dose for the remainder of this cycle, she is agreeable. She will increase to 1500mg BID. I will contact her next week to see how she tolerates. -I discussed her FO genomic sequencing results with patient and her daughter, which showed MSI stable disease, low tumor burden, K-ras mutation positive, no other targetable mutations.  She is not a candidate for immunotherapy or EGFR inhibitor. -I recommend adding on bevacizumab to Xeloda, which will improve disease control.  I discussed benefit and potential side effects, which includes but not limited to, hypertension, proteinuria, small risk of thrombosis, and bleeding, small risk of bowel perforation, wound healing, etc.  I given her handout material.  She will think about it, but is not very interested in this.  -Phone visit after cycle 2 Xeloda, and office visit with lab after cycle 3 Xeloda   2. Low appetite, weight loss -secondary to #1 -she has low appetite and lost 28 lbs in 1 year, some of which is related to covid-19 illness. She lost 8 lbs during 04/2021 -we reviewed supplements, nutrition, hydration. -we previously discussed appetite stimulant such as mirtazapine. She prefers to hold for now -her weight is stable at  180 lbs this month (7/22)   3. IDA, secondary to #1 -she developed mild anemia Hg 10.6 in 08/2020, after covid-infection, started oral iron -Hg worsened to 7.7 12/30/20 when she was found to have heme +stool. S/p RBC transfusion,   IV Feraheme 03/20/21 and 03/27/21, and continues oral iron -eliquis was stopped. -CT 01/2021 showed mass in the cecum, pt declined colonoscopy -Ferritin 178 on 05/21/21 -Hg 10.9 on 06/15/21 -Continue oral iron   4. COVID-19 infection, DVT/PE, and subsequent pneumonia -She presented to ED in 06/2020 with dyspnea, work up showed COVID-19 + and saddle PE. She was admitted for management, d/c'd on eliquis to SNF -subsequently developed pneumonia in 10/2020, recovered -requires walker at home, some assistance to stand from wheelchair today but does not need assistance at home   5. Genetics -due to her personal history of colon cancer, and family h/o ovarian and breast cancer, she qualifies for genetic testing to r/o inheritable cancer syndrome -Genetic panel obtained 05/21/2021, results negative   6. Goals of care, DNR  -We reviewed the aggressive nature of her disease, given the new liver metastasis in 3 months.  She understands her metastatic cancer is not curable but treatable, the goal is palliative -We previously discussed prognosis and life expectancy, with and without treatment -Patient is DNR, daughter Gail Everhart is only child and has medical HCPOA, she will bring a copy at next visit -Pt's duaghter is second grade teacher, lives close but still works.  Transportation may be an issue in the future, will refer to social work if needed   PLAN: -increase Xeloda to 1500 mg BID, 1 week on/1 week off, she started this morning  -I will call them next week, before her cycle 3 Xeloda  -We discussed adding bevacizumab, she will think about it.    No problem-specific Assessment & Plan notes found for this encounter.   No orders of the defined types were placed in this  encounter.  All questions were answered. The patient knows to call the clinic with any problems, questions or concerns. No barriers to learning was detected. The total time spent in the appointment was 40 minutes.     Yan Feng, MD 06/15/2021   I, Katie Daubenspeck, am acting as scribe for Yan Feng, MD.   I have reviewed the above documentation for accuracy and completeness, and I agree with the above.     

## 2021-06-15 ENCOUNTER — Other Ambulatory Visit: Payer: Self-pay

## 2021-06-15 ENCOUNTER — Inpatient Hospital Stay (HOSPITAL_BASED_OUTPATIENT_CLINIC_OR_DEPARTMENT_OTHER): Payer: Medicare Other | Admitting: Hematology

## 2021-06-15 ENCOUNTER — Inpatient Hospital Stay: Payer: Medicare Other

## 2021-06-15 ENCOUNTER — Encounter: Payer: Self-pay | Admitting: Hematology

## 2021-06-15 VITALS — BP 156/58 | HR 62 | Temp 98.3°F | Resp 20 | Wt 180.8 lb

## 2021-06-15 DIAGNOSIS — R634 Abnormal weight loss: Secondary | ICD-10-CM | POA: Diagnosis not present

## 2021-06-15 DIAGNOSIS — Z8616 Personal history of COVID-19: Secondary | ICD-10-CM | POA: Diagnosis not present

## 2021-06-15 DIAGNOSIS — Z66 Do not resuscitate: Secondary | ICD-10-CM | POA: Diagnosis not present

## 2021-06-15 DIAGNOSIS — C787 Secondary malignant neoplasm of liver and intrahepatic bile duct: Secondary | ICD-10-CM

## 2021-06-15 DIAGNOSIS — C189 Malignant neoplasm of colon, unspecified: Secondary | ICD-10-CM

## 2021-06-15 DIAGNOSIS — D509 Iron deficiency anemia, unspecified: Secondary | ICD-10-CM | POA: Diagnosis not present

## 2021-06-15 DIAGNOSIS — C180 Malignant neoplasm of cecum: Secondary | ICD-10-CM | POA: Diagnosis not present

## 2021-06-15 LAB — CMP (CANCER CENTER ONLY)
ALT: 9 U/L (ref 0–44)
AST: 17 U/L (ref 15–41)
Albumin: 3.4 g/dL — ABNORMAL LOW (ref 3.5–5.0)
Alkaline Phosphatase: 59 U/L (ref 38–126)
Anion gap: 7 (ref 5–15)
BUN: 22 mg/dL (ref 8–23)
CO2: 27 mmol/L (ref 22–32)
Calcium: 9.5 mg/dL (ref 8.9–10.3)
Chloride: 109 mmol/L (ref 98–111)
Creatinine: 0.89 mg/dL (ref 0.44–1.00)
GFR, Estimated: >60 mL/min (ref >60)
Glucose, Bld: 95 mg/dL (ref 70–99)
Potassium: 4.5 mmol/L (ref 3.5–5.1)
Sodium: 143 mmol/L (ref 135–145)
Total Bilirubin: 0.4 mg/dL (ref 0.3–1.2)
Total Protein: 6.8 g/dL (ref 6.5–8.1)

## 2021-06-15 LAB — CBC WITH DIFFERENTIAL (CANCER CENTER ONLY)
Abs Immature Granulocytes: 0.02 10*3/uL (ref 0.00–0.07)
Basophils Absolute: 0 10*3/uL (ref 0.0–0.1)
Basophils Relative: 0 %
Eosinophils Absolute: 0.1 10*3/uL (ref 0.0–0.5)
Eosinophils Relative: 2 %
HCT: 34.1 % — ABNORMAL LOW (ref 36.0–46.0)
Hemoglobin: 10.9 g/dL — ABNORMAL LOW (ref 12.0–15.0)
Immature Granulocytes: 0 %
Lymphocytes Relative: 25 %
Lymphs Abs: 1.9 10*3/uL (ref 0.7–4.0)
MCH: 29.1 pg (ref 26.0–34.0)
MCHC: 32 g/dL (ref 30.0–36.0)
MCV: 91.2 fL (ref 80.0–100.0)
Monocytes Absolute: 1 10*3/uL (ref 0.1–1.0)
Monocytes Relative: 14 %
Neutro Abs: 4.5 10*3/uL (ref 1.7–7.7)
Neutrophils Relative %: 59 %
Platelet Count: 290 10*3/uL (ref 150–400)
RBC: 3.74 MIL/uL — ABNORMAL LOW (ref 3.87–5.11)
RDW: 16 % — ABNORMAL HIGH (ref 11.5–15.5)
WBC Count: 7.5 10*3/uL (ref 4.0–10.5)
nRBC: 0 % (ref 0.0–0.2)

## 2021-06-15 LAB — CEA (IN HOUSE-CHCC): CEA (CHCC-In House): 23.47 ng/mL — ABNORMAL HIGH (ref 0.00–5.00)

## 2021-06-15 MED ORDER — CAPECITABINE 500 MG PO TABS
ORAL_TABLET | ORAL | 0 refills | Status: DC
Start: 2021-06-15 — End: 2022-01-26

## 2021-06-16 ENCOUNTER — Telehealth: Payer: Self-pay | Admitting: Hematology

## 2021-06-16 NOTE — Telephone Encounter (Signed)
Scheduled follow-up appointment per 7/25 los. Patient is aware. 

## 2021-06-20 ENCOUNTER — Other Ambulatory Visit: Payer: Self-pay | Admitting: Nurse Practitioner

## 2021-06-24 ENCOUNTER — Telehealth: Payer: Self-pay | Admitting: Hematology

## 2021-06-24 ENCOUNTER — Encounter: Payer: Self-pay | Admitting: Hematology

## 2021-06-24 ENCOUNTER — Other Ambulatory Visit: Payer: Self-pay

## 2021-06-24 ENCOUNTER — Inpatient Hospital Stay: Payer: Medicare Other | Attending: Nurse Practitioner | Admitting: Hematology

## 2021-06-24 DIAGNOSIS — Z7901 Long term (current) use of anticoagulants: Secondary | ICD-10-CM | POA: Insufficient documentation

## 2021-06-24 DIAGNOSIS — Z90710 Acquired absence of both cervix and uterus: Secondary | ICD-10-CM | POA: Insufficient documentation

## 2021-06-24 DIAGNOSIS — Z800 Family history of malignant neoplasm of digestive organs: Secondary | ICD-10-CM | POA: Insufficient documentation

## 2021-06-24 DIAGNOSIS — C189 Malignant neoplasm of colon, unspecified: Secondary | ICD-10-CM

## 2021-06-24 DIAGNOSIS — C787 Secondary malignant neoplasm of liver and intrahepatic bile duct: Secondary | ICD-10-CM | POA: Diagnosis not present

## 2021-06-24 DIAGNOSIS — D649 Anemia, unspecified: Secondary | ICD-10-CM | POA: Insufficient documentation

## 2021-06-24 DIAGNOSIS — Z79899 Other long term (current) drug therapy: Secondary | ICD-10-CM | POA: Insufficient documentation

## 2021-06-24 DIAGNOSIS — C180 Malignant neoplasm of cecum: Secondary | ICD-10-CM | POA: Insufficient documentation

## 2021-06-24 DIAGNOSIS — Z8041 Family history of malignant neoplasm of ovary: Secondary | ICD-10-CM | POA: Insufficient documentation

## 2021-06-24 DIAGNOSIS — I10 Essential (primary) hypertension: Secondary | ICD-10-CM | POA: Insufficient documentation

## 2021-06-24 DIAGNOSIS — Z5112 Encounter for antineoplastic immunotherapy: Secondary | ICD-10-CM | POA: Insufficient documentation

## 2021-06-24 NOTE — Telephone Encounter (Signed)
Scheduled follow-up appointments per 8/3 los. Patient is aware. 

## 2021-06-24 NOTE — Progress Notes (Signed)
[facility]   [phone] [phone]   Clinic Follow up Note   Patient Care Team: Crist Infante, MD as PCP - General (Internal Medicine) Alla Feeling, NP as Nurse Practitioner (Nurse Practitioner) Truitt Merle, MD as Consulting Physician (Hematology) Otis Brace, MD as Referring Physician (Gastroenterology)  Date of Service:  06/24/2021  I connected with [name] on 06/24/2021 at  4:00 PM EDT by telephone visit and verified that I am speaking with the correct person using two identifiers.  I discussed the limitations, risks, security and privacy concerns of performing an evaluation and management service by telephone and the availability of in person appointments. I also discussed with the patient that there may be a patient responsible charge related to this service. The patient expressed understanding and agreed to proceed.   Other persons participating in the visit and their role in the encounter:  Daughter   Patient's location:  Home Provider's location:  my office  CHIEF COMPLAINT: f/u of colon cancer  SUMMARY OF ONCOLOGIC HISTORY: Oncology History  Adenocarcinoma of colon metastatic to liver ([place])  06/2020 Miscellaneous   Patient well until + covid-19 in 06/2020 with DVT/PE at that time. Hospitalized for anticoagulation, discharged to SNF on eliquis. [facility] 08/2020. Subsequent pneumonia 10/2020 and RUL nodule new from 2017 warranting surveillance. Ongoing weight loss and worsening anemia 12/2020 Hg 7.7 heme + stool s/p RBC and IV Feraheme. CT 01/2021 showed mass in the cecum near ileocecal valve. Pt declined colonoscopy. Repeat CT 04/2021 showed enlarging colon mass and new liver masses. Liver bx confirmed metastatic colon adeno carcinoma.    11/20/2020 Imaging   CT chest with contrast -COVID-19 pneumonia follow-up  IMPRESSION: No findings suspicious for pneumonia. Lingular and right lower lobe opacities favor atelectasis.   Moderate right  pleural effusion, increased. Trace left pleural effusion, new. No frank interstitial edema.   6 mm central right upper lobe nodule, unchanged from recent CT, but new from 2017. Follow-up CT chest is suggested in 6 months.   02/12/2021 Imaging   CT AP with contrast IMPRESSION: 1. Abnormal, focal wall thickening in the medial cecum, proximal to the ileocecal valve. Imaging features highly suspicious for neoplasm in light of the reported history of blood in stool. 2. No evidence for metastatic disease in the abdomen or pelvis. No ileocolic lymphadenopathy. 3. 8 mm common bile duct diameter in the head of the pancreas, upper normal for patient age. 4. Stable right pleural effusion with collapse/consolidation in the right lower lobe comparing to CT chest 11/20/2020. 5. Left colonic diverticulosis without diverticulitis. 6. Aortic Atherosclerosis (ICD10-I70.0).   04/30/2021 Imaging   CT AP with contrast IMPRESSION: 1. Progressive infiltrative mass involving the cecum compared with previous CT 3 months ago, consistent with progressive colon cancer. 2. Interval development of several hypoattenuating liver lesions consistent with metastatic disease. No abdominal adenopathy or signs of peritoneal carcinomatosis. 3. Progressive intra and extrahepatic biliary dilatation of undetermined etiology. Given change from previous recent CT, correlation with liver function studies recommended. 4. Stable right pleural effusion and right basilar pulmonary opacity from recent prior studies. Of note, a right upper lobe pulmonary nodule was demonstrated on chest CT 11/20/2020 for which chest CT follow-up is recommended. That is potentially a metastasis. 5.  Aortic Atherosclerosis (ICD10-I70.0).   05/07/2021 Cancer Staging   Staging form: Colon and Rectum, AJCC 8th Edition - Clinical stage from 05/07/2021: Stage Unknown (cTX, cN0, pM1) - Signed by Alla Feeling, NP on 05/13/2021  Total  positive nodes:  0  Laterality: Right  Sites of metastasis: Liver    05/07/2021 Initial Biopsy   Liver biopsy FINAL MICROSCOPIC DIAGNOSIS: A. LEFT LIVER LESION, NEEDLE CORE BIOPSY: - Adenocarcinoma.  ADDENDUM: Immunohistochemistry shows the adenocarcinoma is positive with cytokeratin 20 and CDX2 and is negative with cytokeratin 7, TTF-1 and Napsin A. The immunophenotype is consistent with metastatic colorectal adenocarcinoma. IHC Normal   05/07/2021 Miscellaneous   [facility]   Biomarker Findings -Microsatellite status - MS-Stable -Tumor Mutational Burden - 1 Muts/Mb  Genomic Findings -KRAS G12D -NRAS wildtype -APC R216 -ATM V455M -FAM123B R353   05/13/2021 Initial Diagnosis   Adenocarcinoma of colon metastatic to liver ([place])    06/06/2021 Genetic Testing   Negative genetic testing:  No pathogenic variants detected on the [facility] Multi-Cancer + RNA panel. The report date is 06/06/2021.   The Multi-Cancer + RNA Panel offered by [facility] includes sequencing and/or deletion/duplication analysis of the following 84 genes:  AIP*, ALK, APC*, ATM*, AXIN2*, BAP1*, BARD1*, BLM*, BMPR1A*, BRCA1*, BRCA2*, BRIP1*, CASR, CDC73*, CDH1*, CDK4, CDKN1B*, CDKN1C*, CDKN2A, CEBPA, CHEK2*, CTNNA1*, DICER1*, DIS3L2*, EGFR, EPCAM, FH*, FLCN*, GATA2*, GPC3, GREM1, HOXB13, HRAS, KIT, MAX*, MEN1*, MET, MITF, MLH1*, MSH2*, MSH3*, MSH6*, MUTYH*, NBN*, NF1*, NF2*, NTHL1*, PALB2*, PDGFRA, PHOX2B, PMS2*, POLD1*, POLE*, POT1*, PRKAR1A*, PTCH1*, PTEN*, RAD50*, RAD51C*, RAD51D*, RB1*, RECQL4, RET, RUNX1*, SDHA*, SDHAF2*, SDHB*, SDHC*, SDHD*, SMAD4*, SMARCA4*, SMARCB1*, SMARCE1*, STK11*, SUFU*, TERC, TERT, TMEM127*, Tp53*, TSC1*, TSC2*, VHL*, WRN*, and WT1.  RNA analysis is performed for * genes.   07/13/2021 -  Chemotherapy      Patient is on Antibody Plan: COLORECTAL BEVACIZUMAB Q21D        CURRENT THERAPY:  first line single agent Xeloda 1000 mg AM/1500 mg PM for 1 week on/1 week off  INTERVAL HISTORY:  [name] is scheduled for a virtual visit for a follow up of colon cancer. She was last seen by me on 06/15/21.  I increased her Xeloda dose to 1500 mg twice daily for cycle 2, which she started last Monday.  She initially tolerated well, but developed mild to moderate diarrhea from day 4, she took some Imodium's, which has improved, but still has loose bowel movement.  Mild fatigue, able to eat and drink adequately.  No other new complaints.    All other systems were reviewed with the patient and are negative.  MEDICAL HISTORY:  Past Medical History:  Diagnosis Date   Arthritis    knees   Complication of anesthesia    problem 40 yrs ago Doctor told her anesthesia drugs caused a reaction, BP drops   Family history of breast cancer    Family history of ovarian cancer    Family history of pancreatic cancer    Hypertension     SURGICAL HISTORY: Past Surgical History:  Procedure Laterality Date   ABDOMINAL HYSTERECTOMY     CATARACT EXTRACTION W/PHACO Left 08/09/2018   Procedure: CATARACT EXTRACTION PHACO AND INTRAOCULAR LENS PLACEMENT ([place])  ISTENT AND ISTENT INJECT LEFT;  Surgeon: Leandrew Koyanagi, MD;  Location: [facility];  Service: Ophthalmology;  Laterality: Left;   CHOLECYSTECTOMY     EYE SURGERY     cataract with lens implant-right   INSERTION OF ANTERIOR SEGMENT AQUEOUS DRAINAGE DEVICE (ISTENT) Left 08/09/2018   Procedure: INSERTION OF ANTERIOR SEGMENT AQUEOUS DRAINAGE DEVICE (ISTENT);  Surgeon: Leandrew Koyanagi, MD;  Location: [facility];  Service: Ophthalmology;  Laterality: Left;   TOTAL KNEE ARTHROPLASTY Right 02/27/2014   Procedure: RIGHT TOTAL KNEE ARTHROPLASTY;  Surgeon: Tobi Bastos, MD;  Location: [facility];  Service: Orthopedics;  Laterality: Right;   TUBAL LIGATION      I have reviewed the social history and family history with the patient and they are unchanged from previous note.  ALLERGIES:  is allergic to cefdinir, codeine, ibandronic acid,  meperidine hcl, adhesive [tape], sulfa antibiotics, and sulfonamide derivatives.  MEDICATIONS:  Current Outpatient Medications  Medication Sig Dispense Refill   acetaminophen (TYLENOL) 500 MG tablet Take 500 mg by mouth every 6 (six) hours as needed for moderate pain or headache.     albuterol (VENTOLIN HFA) 108 (90 Base) MCG/ACT inhaler Inhale 2 puffs into the lungs every 4 (four) hours as needed for wheezing or shortness of breath.     brimonidine (ALPHAGAN) 0.2 % ophthalmic solution Place 1 drop into the left eye 2 (two) times daily.     buPROPion (WELLBUTRIN XL) 150 MG 24 hr tablet Take 150 mg by mouth daily.      capecitabine (XELODA) 500 MG tablet Take 3 tablets in morning and 3 tablets in evening every 12 hours. Take for 7 days on and 7 days off. Take within 30 minutes after meals. 84 tablet 0   cholecalciferol (VITAMIN D3) 25 MCG (1000 UNIT) tablet Take 1,000 Units by mouth daily.     dorzolamide-timolol (COSOPT) 22.3-6.8 MG/ML ophthalmic solution Place 1 drop into both eyes 2 (two) times daily.     escitalopram (LEXAPRO) 5 MG tablet Take 5 mg by mouth daily.     famotidine (PEPCID) 20 MG tablet TAKE 1 TABLET BY MOUTH EVERY DAY 30 tablet 1   Fe Fum-FePoly-Vit C-Vit B3 (INTEGRA) 62.5-62.5-40-3 MG CAPS Take 1 capsule by mouth daily.     fexofenadine (ALLEGRA) 180 MG tablet Take 180 mg by mouth daily as needed for allergies or rhinitis.     fluticasone (FLONASE) 50 MCG/ACT nasal spray Place 1 spray into both nostrils daily as needed for allergies or rhinitis.     furosemide (LASIX) 20 MG tablet Take one tablet (23m total) by mouth every other day, alternating with two tablets (427mtotal) by mouth every other day. (Patient taking differently: Take 20-40 mg by mouth See admin instructions. Take one tablet (2069motal) by mouth every other day, alternating with two tablets (81m38mtal) by mouth every other day.) 132 tablet 3   Guaifenesin (MUCINEX MAXIMUM STRENGTH) 1200 MG TB12 Take 1,200 mg by  mouth 2 (two) times daily as needed (congestion).     KLOR-CON M20 20 MEQ tablet Take 10 mEq by mouth every Monday, Wednesday, and Friday.     latanoprost (XALATAN) 0.005 % ophthalmic solution Place 1 drop into both eyes at bedtime.     loperamide (IMODIUM A-D) 2 MG tablet Take 2-4 mg by mouth 4 (four) times daily as needed for diarrhea or loose stools.     Multiple Vitamins-Minerals (ADULT GUMMY PO) Take 2 capsules by mouth daily.     ondansetron (ZOFRAN) 8 MG tablet Take 1 tablet (8 mg total) by mouth every 8 (eight) hours as needed for nausea or vomiting. 20 tablet 1   rosuvastatin (CRESTOR) 10 MG tablet Take 10 mg by mouth daily.     vitamin C (ASCORBIC ACID) 500 MG tablet Take 500 mg by mouth daily.     No current facility-administered medications for this visit.    PHYSICAL EXAMINATION: ECOG PERFORMANCE STATUS: 2 - Symptomatic, <50% confined to bed  There were no vitals filed for this visit. Wt Readings from  Last 3 Encounters:  06/15/21 180 lb 12.8 oz (82 kg)  05/27/21 179 lb 6.4 oz (81.4 kg)  05/13/21 177 lb (80.3 kg)     No vitals taken today, Exam not performed today  LABORATORY DATA:  I have reviewed the data as listed CBC Latest Ref Rng & Units 06/15/2021 05/21/2021 05/07/2021  WBC 4.0 - 10.5 K/uL 7.5 6.9 8.9  Hemoglobin 12.0 - 15.0 g/dL 10.9(L) 11.7(L) 11.5(L)  Hematocrit 36.0 - 46.0 % 34.1(L) 36.9 36.9  Platelets 150 - 400 K/uL 290 338 290     CMP Latest Ref Rng & Units 06/15/2021 05/21/2021 12/30/2020  Glucose 70 - 99 mg/dL 95 92 89  BUN 8 - 23 mg/dL 22 22 20   Creatinine 0.44 - 1.00 mg/dL 0.89 0.97 0.96  Sodium 135 - 145 mmol/L 143 141 144  Potassium 3.5 - 5.1 mmol/L 4.5 4.1 5.2  Chloride 98 - 111 mmol/L 109 104 109(H)  CO2 22 - 32 mmol/L 27 29 23   Calcium 8.9 - 10.3 mg/dL 9.5 9.9 9.2  Total Protein 6.5 - 8.1 g/dL 6.8 8.0 -  Total Bilirubin 0.3 - 1.2 mg/dL 0.4 0.5 -  Alkaline Phos 38 - 126 U/L 59 67 -  AST 15 - 41 U/L 17 21 -  ALT 0 - 44 U/L 9 15 -       RADIOGRAPHIC STUDIES: I have personally reviewed the radiological images as listed and agreed with the findings in the report. No results found.   ASSESSMENT & PLAN:  Jaime [name] is a 87 y.o. female with   1. Adenocarcinoma of cecum with liver metastasis, stage IV, MMR normal, KRAD G12D (+)  -she presented with IDA, found to have cecum mass on imaging, colonoscopy was declined. After 3 months, she was found to have enlarging cecal mass and new liver masses. Liver biopsy confirmed metastatic colon adenocarcinoma -we discussed this is stage IV cancer, she is not a surgical candidate for resection and therefore not curable at this stage, but still treatable -she values comfort and quality of life over prolonging her life. She is interested to see if she is a candidate for immunotherapy. IHC was normal. -FO genomic sequencing showed MSI stable disease, low tumor burden, K-ras mutation positive, no other targetable mutations.  She is not a candidate for immunotherapy or EGFR inhibitor. -We agreed to proceed with single agent Xeloda, which she started  -I increased her Xeloda dose to 1500 mg twice daily from cycle 2, she has developed diarrhea, but was able to finish 7 days course -We will reduce Xarelto dose back to 1000 mg in the morning and 1500 mg in the evening, 7 days on and 7 days off, she was started cycle 3 next Monday, August 8.  She knows not to start until her diarrhea resolves -I again encouraged her to consider bevacizumab, she has not decided -Follow-up on August 22 for cycle 4.   2. Low appetite, weight loss -secondary to #1 -she has low appetite and lost 28 lbs in 1 year, some of which is related to covid-19 illness. She lost 8 lbs during 04/2021 -we reviewed supplements, nutrition, hydration. -we previously discussed appetite stimulant such as mirtazapine. She prefers to hold for now -her weight is stable lately      PLAN: -she completed dose increased to cycle 2 but  developed diarrhea -We will reduce Xeloda dose back to 1000 mg in the morning, 1500 mg in the evening, 7 days on and 7 days of, plan  to start cycle 3, Monday August days -Lab, follow-up and bevacizumab on August 22   No problem-specific Assessment & Plan notes found for this encounter.   No orders of the defined types were placed in this encounter.  All questions were answered. The patient knows to call the clinic with any problems, questions or concerns. No barriers to learning was detected. The total time spent in the appointment was 15 minutes.     Truitt Merle, MD 06/24/2021   I, Wilburn Mylar, am acting as scribe for Truitt Merle, MD.   I have reviewed the above documentation for accuracy and completeness, and I agree with the above.

## 2021-06-30 ENCOUNTER — Encounter: Payer: Self-pay | Admitting: Hematology

## 2021-07-01 ENCOUNTER — Other Ambulatory Visit: Payer: Self-pay

## 2021-07-01 ENCOUNTER — Other Ambulatory Visit: Payer: Medicare Other | Admitting: Hospice

## 2021-07-01 DIAGNOSIS — C787 Secondary malignant neoplasm of liver and intrahepatic bile duct: Secondary | ICD-10-CM | POA: Diagnosis not present

## 2021-07-01 DIAGNOSIS — R5382 Chronic fatigue, unspecified: Secondary | ICD-10-CM | POA: Diagnosis not present

## 2021-07-01 DIAGNOSIS — C189 Malignant neoplasm of colon, unspecified: Secondary | ICD-10-CM | POA: Diagnosis not present

## 2021-07-01 DIAGNOSIS — Z515 Encounter for palliative care: Secondary | ICD-10-CM | POA: Diagnosis not present

## 2021-07-01 NOTE — Progress Notes (Signed)
[facility] Consult Note Telephone: [phone]  Fax: [phone]  PATIENT NAME: Jaime [name] [address] [place] [address] [phone] (home)  DOB: 02-26-1932 MRN: [id number]  PRIMARY CARE PROVIDER:    Crist Infante, MD,  [place] [address] [phone]  REFERRING PROVIDER:   Crist Infante, MD [address] [place],   [address] [phone]  RESPONSIBLE PARTY:   Self Contact Information     Name Relation Home Work Mobile   Jaime [name] Daughter [phone]  [phone]   Jaime [name]   [phone]        I met face to face with patient and family at home. Palliative Care was asked to follow this patient by consultation request of  Jaime Infante, MD to address advance care planning, complex medical decision making and goals of care clarification. This is the initial visit.    ASSESSMENT AND / RECOMMENDATIONS:   Advance Care Planning: Our advance care planning conversation included a discussion about:    The value and importance of advance care planning  Difference between Hospice and Palliative care Exploration of goals of care in the event of a sudden injury or illness  Identification and preparation of a healthcare agent  Review and updating or creation of an  advance directive document . Decision not to resuscitate or to de-escalate disease focused treatments due to poor prognosis.  CODE STATUS:Patient affirmed she is Do Not Resuscitate.  Goals of Care: Goals include to maximize quality of life and symptom management. Patient does not want to go to a nursing home; she is open to hospice service in the future and would like to spend her last week or two in a hospice home.   I spent  46 minutes providing this initial consultation. More than 50% of the time in this consultation was spent on counseling patient and coordinating  communication. --------------------------------------------------------------------------------------------------------------------------------------  Symptom Management/Plan: Fatigue:related to colon metastatic cancer; ambulatory  with rolling walker within the home. PPS 50%. Encouraged adequate oral intake, rest and physical activities as tolerated.  Colon cancer: Currently on Xeloda with plan to soon commence chemo infusion.  Xeloda well tolerated except occasional diarrhea well managed with Imodium. Follow up with Oncologist next week. Routine CBC CMP Depression: Managed with Lexapro, Bupropion Follow up: Palliative care will continue to follow for complex medical decision making, advance care planning, and clarification of goals. Return 6 weeks or prn.Encouraged to call provider sooner with any concerns.   Family /Caregiver/Community Supports: Patient lives home alone; caregivers come 5 days a week. Daughter lives nearby and visits daily. Strong support system identified.   HOSPICE ELIGIBILITY/DIAGNOSIS: TBD  Chief Complaint: Initial Palliative care visit  HISTORY OF PRESENT ILLNESS:  Jaime [name] is a 87 y.o. year old female  with multiple medical conditions including worsening fatigue related to colon cancer with mets to the liver; worsening fatigue impairs her self care and independence, now depending on her daughter and caregiver for  assistance in her ADLs. Resting is helpful. She continues to ambulate with her rolling walker which she reports is also a little helpful.  History of  R knee replacement, left knee arthritis, Depression. Patient denies pain/discomfort, endorsed History obtained from review of EMR, discussion with primary team, caregiver, family and/or Jaime [name].  Review and summarization of Epic records shows history from other than patient. Rest of 10 point ROS asked and negative.     Review of lab tests/diagnostics   Results for Jaime [name] (MRN [id number]) as of  07/01/2021 11:30  Ref. Range 06/15/2021 09:03  Sodium Latest Ref Range: 135 - 145 mmol/L 143  Potassium Latest Ref Range: 3.5 - 5.1 mmol/L 4.5  Chloride Latest Ref Range: 98 - 111 mmol/L 109  CO2 Latest Ref Range: 22 - 32 mmol/L 27  Glucose Latest Ref Range: 70 - 99 mg/dL 95  BUN Latest Ref Range: 8 - 23 mg/dL 22  Creatinine Latest Ref Range: 0.44 - 1.00 mg/dL 0.89  Calcium Latest Ref Range: 8.9 - 10.3 mg/dL 9.5  Anion gap Latest Ref Range: 5 - 15  7  Alkaline Phosphatase Latest Ref Range: 38 - 126 U/L 59  Albumin Latest Ref Range: 3.5 - 5.0 g/dL 3.4 (L)  AST Latest Ref Range: 15 - 41 U/L 17  ALT Latest Ref Range: 0 - 44 U/L 9  Total Protein Latest Ref Range: 6.5 - 8.1 g/dL 6.8  Total Bilirubin Latest Ref Range: 0.3 - 1.2 mg/dL 0.4  GFR, Est Non African American Latest Ref Range: >60 mL/min >60    ROS General: NAD EYES: denies vision changes ENMT: denies dysphagia Cardiovascular: denies chest pain/discomfort Pulmonary: denies cough, denies SOB Abdomen: endorses good appetite, denies constipation/diarrhea GU: denies dysuria, urinary frequency MSK:  endorses weakness,  no falls reported Skin: denies rashes or wounds Neurological: denies pain, denies insomnia Psych: Endorses positive mood Heme/lymph/immuno: denies bruises, abnormal bleeding  Physical Exam: Height/Weight: 5 feet 5 inches/180 Ibs Constitutional: NAD General: Well groomed, cooperative EYES: anicteric sclera, lids intact, no discharge, wears eye glasses ENMT: Moist mucous membrane CV: S1 S2, RRR, no LE edema Pulmonary: LCTA, no increased work of breathing, no cough, Abdomen: active BS + 4 quadrants, soft and non tender GU: no suprapubic tenderness MSK: weakness, sarcopenia, limited ROM Skin: warm and dry, no rashes or wounds on visible skin Neuro:  weakness, otherwise non focal, alert and oriented x 4 Psych: non-anxious affect Hem/lymph/immuno: no widespread bruising   PAST MEDICAL HISTORY:  Active  Ambulatory Problems    Diagnosis Date Noted   Mixed hyperlipidemia 02/13/2009   ALLERGIC RHINITIS 02/13/2009   PLEURAL EFFUSION, RIGHT 02/13/2009   PULMONARY NODULE 02/13/2009   Osteoarthritis of right knee 02/27/2014   Total knee replacement status 02/27/2014   Acute blood loss anemia 02/28/2014   Acute saddle pulmonary embolism with acute cor pulmonale ([facility]) 07/22/2020   Open-angle glaucoma 07/22/2020   Acute respiratory failure with hypoxia ([facility]) 07/22/2020   COVID-19 virus infection 07/22/2020   Elevated troponin level not due myocardial infarction 07/22/2020   Acute hypoxemic respiratory failure due to COVID-19 [facility]) 07/22/2020   Adenocarcinoma of colon metastatic to liver ([facility]) 05/13/2021   Family history of ovarian cancer 05/22/2021   Family history of breast cancer 05/22/2021   Family history of pancreatic cancer 05/22/2021   Genetic testing 06/06/2021   Resolved Ambulatory Problems    Diagnosis Date Noted   No Resolved Ambulatory Problems   Past Medical History:  Diagnosis Date   Arthritis    Complication of anesthesia    Hypertension     SOCIAL HX:  Social History   Tobacco Use   Smoking status: Never   Smokeless tobacco: Never  Substance Use Topics   Alcohol use: No     FAMILY HX:  Family History  Problem Relation Age of Onset   Ovarian cancer Mother 10       metastatic to colon   Heart attack Father    Aneurysm Sister  abdominal   Alzheimer's disease Sister    Breast cancer Maternal Aunt        dx >50   Breast cancer Cousin        dx >50, maternal cousin   Pancreatic cancer Cousin        dx >64s, maternal cousin   Cancer Cousin        unknown type, dx 67s, maternal cousin      ALLERGIES:  Allergies  Allergen Reactions   Cefdinir Diarrhea   Codeine     "makes me feel weird"   Ibandronic Acid Diarrhea   Meperidine Hcl     Lowers blood pressure    Adhesive [Tape] Rash   Sulfa Antibiotics Rash   Sulfonamide Derivatives Rash       PERTINENT MEDICATIONS:  Outpatient Encounter Medications as of 07/01/2021  Medication Sig   acetaminophen (TYLENOL) 500 MG tablet Take 500 mg by mouth every 6 (six) hours as needed for moderate pain or headache.   albuterol (VENTOLIN HFA) 108 (90 Base) MCG/ACT inhaler Inhale 2 puffs into the lungs every 4 (four) hours as needed for wheezing or shortness of breath.   brimonidine (ALPHAGAN) 0.2 % ophthalmic solution Place 1 drop into the left eye 2 (two) times daily.   buPROPion (WELLBUTRIN XL) 150 MG 24 hr tablet Take 150 mg by mouth daily.    capecitabine (XELODA) 500 MG tablet Take 3 tablets in morning and 3 tablets in evening every 12 hours. Take for 7 days on and 7 days off. Take within 30 minutes after meals.   cholecalciferol (VITAMIN D3) 25 MCG (1000 UNIT) tablet Take 1,000 Units by mouth daily.   dorzolamide-timolol (COSOPT) 22.3-6.8 MG/ML ophthalmic solution Place 1 drop into both eyes 2 (two) times daily.   escitalopram (LEXAPRO) 5 MG tablet Take 5 mg by mouth daily.   famotidine (PEPCID) 20 MG tablet TAKE 1 TABLET BY MOUTH EVERY DAY   Fe Fum-FePoly-Vit C-Vit B3 (INTEGRA) 62.5-62.5-40-3 MG CAPS Take 1 capsule by mouth daily.   fexofenadine (ALLEGRA) 180 MG tablet Take 180 mg by mouth daily as needed for allergies or rhinitis.   fluticasone (FLONASE) 50 MCG/ACT nasal spray Place 1 spray into both nostrils daily as needed for allergies or rhinitis.   furosemide (LASIX) 20 MG tablet Take one tablet (5m total) by mouth every other day, alternating with two tablets (482mtotal) by mouth every other day. (Patient taking differently: Take 20-40 mg by mouth See admin instructions. Take one tablet (2047motal) by mouth every other day, alternating with two tablets (29m23mtal) by mouth every other day.)   Guaifenesin (MUCINEX MAXIMUM STRENGTH) 1200 MG TB12 Take 1,200 mg by mouth 2 (two) times daily as needed (congestion).   KLOR-CON M20 20 MEQ tablet Take 10 mEq by mouth every Monday,  Wednesday, and Friday.   latanoprost (XALATAN) 0.005 % ophthalmic solution Place 1 drop into both eyes at bedtime.   loperamide (IMODIUM A-D) 2 MG tablet Take 2-4 mg by mouth 4 (four) times daily as needed for diarrhea or loose stools.   Multiple Vitamins-Minerals (ADULT GUMMY PO) Take 2 capsules by mouth daily.   ondansetron (ZOFRAN) 8 MG tablet Take 1 tablet (8 mg total) by mouth every 8 (eight) hours as needed for nausea or vomiting.   rosuvastatin (CRESTOR) 10 MG tablet Take 10 mg by mouth daily.   vitamin C (ASCORBIC ACID) 500 MG tablet Take 500 mg by mouth daily.   No facility-administered encounter medications on file as  of 07/01/2021.     Thank you for the opportunity to participate in the care of Ms. Bosher.  The palliative care team will continue to follow. Please call our office at [phone] if we can be of additional assistance.   Note: Portions of this note were generated with [employer]. Dictation errors may occur despite best attempts at proofreading.  Teodoro Spray, NP

## 2021-07-02 ENCOUNTER — Other Ambulatory Visit: Payer: Self-pay | Admitting: Hematology

## 2021-07-02 ENCOUNTER — Other Ambulatory Visit (HOSPITAL_COMMUNITY): Payer: Self-pay

## 2021-07-02 DIAGNOSIS — H401123 Primary open-angle glaucoma, left eye, severe stage: Secondary | ICD-10-CM | POA: Diagnosis not present

## 2021-07-02 DIAGNOSIS — C189 Malignant neoplasm of colon, unspecified: Secondary | ICD-10-CM

## 2021-07-02 MED ORDER — CAPECITABINE 500 MG PO TABS
ORAL_TABLET | ORAL | 1 refills | Status: DC
  Filled 2021-07-02: qty 35, fill #0
  Filled 2021-07-03: qty 35, 14d supply, fill #0

## 2021-07-03 ENCOUNTER — Other Ambulatory Visit (HOSPITAL_COMMUNITY): Payer: Self-pay

## 2021-07-06 NOTE — Progress Notes (Signed)
Pharmacist Chemotherapy Monitoring - Initial Assessment    Anticipated start date: 07/13/21   The following has been reviewed per standard work regarding the patient's treatment regimen: The patient's diagnosis, treatment plan and drug doses, and organ/hematologic function Lab orders and baseline tests specific to treatment regimen  The treatment plan start date, drug sequencing, and pre-medications Prior authorization status  Patient's documented medication list, including drug-drug interaction screen and prescriptions for anti-emetics and supportive care specific to the treatment regimen The drug concentrations, fluid compatibility, administration routes, and timing of the medications to be used The patient's access for treatment and lifetime cumulative dose history, if applicable  The patient's medication allergies and previous infusion related reactions, if applicable   Changes made to treatment plan:  N/A  Follow up needed:  Pending authorization for treatment    Philomena Course, [place], 07/06/2021  2:06 PM

## 2021-07-07 ENCOUNTER — Telehealth: Payer: Self-pay

## 2021-07-07 DIAGNOSIS — Z515 Encounter for palliative care: Secondary | ICD-10-CM

## 2021-07-07 NOTE — Telephone Encounter (Signed)
Received call from patient's daughter who shared that patient has a reddened area on her leg that is painful, swollen and warm to touch. Area began on Sunday and has not improved at all. Will f/u with Palliative care nurse/nurse practitioner to evaluate availability for visit.

## 2021-07-08 ENCOUNTER — Other Ambulatory Visit: Payer: Medicare Other | Admitting: Student

## 2021-07-08 ENCOUNTER — Other Ambulatory Visit: Payer: Self-pay

## 2021-07-08 DIAGNOSIS — Z515 Encounter for palliative care: Secondary | ICD-10-CM

## 2021-07-08 DIAGNOSIS — L03115 Cellulitis of right lower limb: Secondary | ICD-10-CM

## 2021-07-08 DIAGNOSIS — R600 Localized edema: Secondary | ICD-10-CM | POA: Diagnosis not present

## 2021-07-08 DIAGNOSIS — R197 Diarrhea, unspecified: Secondary | ICD-10-CM

## 2021-07-08 DIAGNOSIS — C189 Malignant neoplasm of colon, unspecified: Secondary | ICD-10-CM | POA: Diagnosis not present

## 2021-07-08 DIAGNOSIS — C787 Secondary malignant neoplasm of liver and intrahepatic bile duct: Secondary | ICD-10-CM

## 2021-07-08 NOTE — Progress Notes (Signed)
[facility] Consult Note Telephone: [phone]  Fax: [phone]    Date of encounter: 07/08/21 PATIENT NAME: Jaime [name] [address] [place] [address]   [phone] (home)  DOB: 11-09-1932 MRN: [id number] PRIMARY CARE PROVIDER:    Crist Infante, MD,  [place] [address] [phone]  REFERRING PROVIDER:   Crist Infante, MD [address] [place],  [place] [address] [phone]  RESPONSIBLE PARTY:    Contact Information     Name Relation Home Work Mobile   Everhart,Gail Daughter [phone]  [phone]   Karyl Kinnier   [phone]        I met face to face with patient in the home. Palliative Care was asked to follow this patient by consultation request of  Crist Infante, MD to address advance care planning and complex medical decision making. This is a follow up visit.                                   ASSESSMENT AND PLAN / RECOMMENDATIONS:   Advance Care Planning/Goals of Care: Goals include to maximize quality of life and symptom management.   CODE STATUS: DNR  Symptom Management/Plan:  Cellulitis-symptoms consistent with cellulitis of right lower extremity. She is not showing any systemic effects; will start Amoxicillin 816m BID x 7 days. Probiotic also recommended. She is encouraged to elevate her legs.   Edema-patient currently alternates furosemide 23mwith 40 mg every other day. Given her increased edema, she is encouraged to take 40 mg today and tomorrow. Elevate legs, compression hose as able to tolerate.   Adenocarcinoma of colon with metastasis to liver-continue Xeloda as scheduled. Follow up with Oncology as scheduled. Given her leg/calf pain, taking chemo, hx of DVT, will order bilateral doppler [place] rule out DVT. Nurse navigator notified and will order for in home.   Diarrhea-patient reports some improvement in diarrhea; secondary to Xeloda. Continue  imodium PRN.   Follow up Palliative Care Visit: Palliative care will continue to follow for complex medical decision making, advance care planning, and clarification of goals. Return in 4 weeks or prn.  This visit was coded based on medical decision making (MDM).   PPS: 50%  HOSPICE ELIGIBILITY/DIAGNOSIS: TBD  Chief Complaint: Redness to leg, swollen.  HISTORY OF PRESENT ILLNESS:  Jaime [name] a [age].o. year old female  with colon cancer with metastasis to liver. She is currently receiving Xeloda. She does report a hx of bilateral LE DVT; not currently receiving anticoagulation. Patient reports having right leg/calf pain, redness and warmth starting to right lower extremity two days ago. She states she has always had erythema to left lower extremity. She endorses BLE edema that fluctuates. She wears compression hose daily. She denies any fever or chills. No falls or injury reported. She does report diarrhea/loose stools secondary to the Xeloda. She states this has improved some and is taking imodium PRN. She does endorse fatigue. She has a private caregiver daily for 4 four hours. A 10 point review of systems is negative, except for the pertinent positives and negatives detailed in the HPI.   History obtained from review of EMR, discussion with primary team, and interview with family, facility staff/caregiver and/or Jaime [name].  I reviewed available labs, medications, imaging, studies and related documents from the EMR.  Records reviewed and summarized above.   Physical Exam: Pulse 68, resp  16, b/p 110/60, sats 99% on room air  Constitutional: NAD General: frail appearing EYES: anicteric sclera, lids intact, no discharge  ENMT: intact hearing, oral mucous membranes moist, dentition intact CV: S1S2, RRR, no LE edema Pulmonary: LCTA, no increased work of breathing, no cough, room air Abdomen: normo-active BS + 4 quadrants, soft and non tender GU: deferred MSK: moves all extremities,  ambulatory, denies pain with dorsiflexion Skin: 2+ edema to BLE; erythema to RLE, hot and tender to touch, weeping noted. Erythema to LLE, no warmth or weeping noted. Neuro: generalized weakness, A & O x 3 Psych: non-anxious affect, pleasant Hem/lymph/immuno: no widespread bruising   Thank you for the opportunity to participate in the care of Jaime [name].  The palliative care team will continue to follow. Please call our office at [phone] if we can be of additional assistance.   Ezekiel Slocumb, NP   COVID-19 PATIENT SCREENING TOOL Asked and negative response unless otherwise noted:   Have you had symptoms of covid, tested positive or been in contact with someone with symptoms/positive test in the past 5-10 days?

## 2021-07-09 DIAGNOSIS — L539 Erythematous condition, unspecified: Secondary | ICD-10-CM | POA: Diagnosis not present

## 2021-07-09 DIAGNOSIS — M79661 Pain in right lower leg: Secondary | ICD-10-CM | POA: Diagnosis not present

## 2021-07-09 DIAGNOSIS — R208 Other disturbances of skin sensation: Secondary | ICD-10-CM | POA: Diagnosis not present

## 2021-07-09 DIAGNOSIS — M79662 Pain in left lower leg: Secondary | ICD-10-CM | POA: Diagnosis not present

## 2021-07-10 ENCOUNTER — Telehealth: Payer: Self-pay | Admitting: Primary Care

## 2021-07-10 ENCOUNTER — Inpatient Hospital Stay: Payer: Medicare Other | Admitting: Hematology

## 2021-07-10 ENCOUNTER — Inpatient Hospital Stay: Payer: Medicare Other

## 2021-07-10 NOTE — Telephone Encounter (Signed)
T/c to Patient to notify her of no evidence of DVT. She is taking antibiotic and endorses her leg is not worse, may be improving, and edema has lessened. Will call for any concerns.

## 2021-07-13 ENCOUNTER — Inpatient Hospital Stay: Payer: Medicare Other

## 2021-07-14 ENCOUNTER — Other Ambulatory Visit: Payer: Self-pay | Admitting: Hematology

## 2021-07-17 ENCOUNTER — Encounter: Payer: Self-pay | Admitting: Hematology

## 2021-07-17 ENCOUNTER — Ambulatory Visit: Payer: Medicare Other

## 2021-07-17 ENCOUNTER — Inpatient Hospital Stay: Payer: Medicare Other

## 2021-07-17 ENCOUNTER — Other Ambulatory Visit: Payer: Self-pay

## 2021-07-17 ENCOUNTER — Inpatient Hospital Stay (HOSPITAL_BASED_OUTPATIENT_CLINIC_OR_DEPARTMENT_OTHER): Payer: Medicare Other | Admitting: Hematology

## 2021-07-17 ENCOUNTER — Encounter: Payer: Self-pay | Admitting: *Deleted

## 2021-07-17 VITALS — BP 165/64 | HR 62 | Temp 98.4°F | Resp 18 | Ht 65.0 in | Wt 181.9 lb

## 2021-07-17 DIAGNOSIS — C189 Malignant neoplasm of colon, unspecified: Secondary | ICD-10-CM

## 2021-07-17 DIAGNOSIS — C787 Secondary malignant neoplasm of liver and intrahepatic bile duct: Secondary | ICD-10-CM

## 2021-07-17 DIAGNOSIS — Z7901 Long term (current) use of anticoagulants: Secondary | ICD-10-CM | POA: Diagnosis not present

## 2021-07-17 DIAGNOSIS — Z5112 Encounter for antineoplastic immunotherapy: Secondary | ICD-10-CM | POA: Diagnosis not present

## 2021-07-17 DIAGNOSIS — Z8041 Family history of malignant neoplasm of ovary: Secondary | ICD-10-CM | POA: Diagnosis not present

## 2021-07-17 DIAGNOSIS — Z79899 Other long term (current) drug therapy: Secondary | ICD-10-CM | POA: Diagnosis not present

## 2021-07-17 DIAGNOSIS — C180 Malignant neoplasm of cecum: Secondary | ICD-10-CM | POA: Diagnosis not present

## 2021-07-17 DIAGNOSIS — Z800 Family history of malignant neoplasm of digestive organs: Secondary | ICD-10-CM | POA: Diagnosis not present

## 2021-07-17 DIAGNOSIS — Z90710 Acquired absence of both cervix and uterus: Secondary | ICD-10-CM | POA: Diagnosis not present

## 2021-07-17 DIAGNOSIS — I10 Essential (primary) hypertension: Secondary | ICD-10-CM | POA: Diagnosis not present

## 2021-07-17 DIAGNOSIS — D649 Anemia, unspecified: Secondary | ICD-10-CM | POA: Diagnosis not present

## 2021-07-17 LAB — CMP (CANCER CENTER ONLY)
ALT: 8 U/L (ref 0–44)
AST: 20 U/L (ref 15–41)
Albumin: 3.5 g/dL (ref 3.5–5.0)
Alkaline Phosphatase: 64 U/L (ref 38–126)
Anion gap: 8 (ref 5–15)
BUN: 21 mg/dL (ref 8–23)
CO2: 27 mmol/L (ref 22–32)
Calcium: 9.2 mg/dL (ref 8.9–10.3)
Chloride: 106 mmol/L (ref 98–111)
Creatinine: 1.01 mg/dL — ABNORMAL HIGH (ref 0.44–1.00)
GFR, Estimated: 54 mL/min — ABNORMAL LOW (ref >60)
Glucose, Bld: 94 mg/dL (ref 70–99)
Potassium: 4.5 mmol/L (ref 3.5–5.1)
Sodium: 141 mmol/L (ref 135–145)
Total Bilirubin: 0.7 mg/dL (ref 0.3–1.2)
Total Protein: 7.1 g/dL (ref 6.5–8.1)

## 2021-07-17 LAB — CBC WITH DIFFERENTIAL (CANCER CENTER ONLY)
Abs Immature Granulocytes: 0.02 10*3/uL (ref 0.00–0.07)
Basophils Absolute: 0 10*3/uL (ref 0.0–0.1)
Basophils Relative: 0 %
Eosinophils Absolute: 0.1 10*3/uL (ref 0.0–0.5)
Eosinophils Relative: 2 %
HCT: 34.5 % — ABNORMAL LOW (ref 36.0–46.0)
Hemoglobin: 11.2 g/dL — ABNORMAL LOW (ref 12.0–15.0)
Immature Granulocytes: 0 %
Lymphocytes Relative: 31 %
Lymphs Abs: 2.4 10*3/uL (ref 0.7–4.0)
MCH: 30.4 pg (ref 26.0–34.0)
MCHC: 32.5 g/dL (ref 30.0–36.0)
MCV: 93.8 fL (ref 80.0–100.0)
Monocytes Absolute: 1.3 10*3/uL — ABNORMAL HIGH (ref 0.1–1.0)
Monocytes Relative: 17 %
Neutro Abs: 3.8 10*3/uL (ref 1.7–7.7)
Neutrophils Relative %: 50 %
Platelet Count: 217 10*3/uL (ref 150–400)
RBC: 3.68 MIL/uL — ABNORMAL LOW (ref 3.87–5.11)
RDW: 16.3 % — ABNORMAL HIGH (ref 11.5–15.5)
WBC Count: 7.6 10*3/uL (ref 4.0–10.5)
nRBC: 0 % (ref 0.0–0.2)

## 2021-07-17 LAB — CEA (IN HOUSE-CHCC): CEA (CHCC-In House): 23.63 ng/mL — ABNORMAL HIGH (ref 0.00–5.00)

## 2021-07-17 LAB — IRON AND TIBC
Iron: 93 ug/dL (ref 41–142)
Saturation Ratios: 24 % (ref 21–57)
TIBC: 394 ug/dL (ref 236–444)
UIBC: 300 ug/dL (ref 120–384)

## 2021-07-17 LAB — FERRITIN: Ferritin: 88 ng/mL (ref 11–307)

## 2021-07-17 LAB — TOTAL PROTEIN, URINE DIPSTICK: Protein, ur: <30 mg/dL

## 2021-07-17 MED ORDER — SODIUM CHLORIDE 0.9 % IV SOLN
7.5000 mg/kg | Freq: Once | INTRAVENOUS | Status: AC
  Administered 2021-07-17: 600 mg via INTRAVENOUS
  Filled 2021-07-17: qty 16

## 2021-07-17 MED ORDER — SODIUM CHLORIDE 0.9 % IV SOLN: Freq: Once | INTRAVENOUS | Status: AC

## 2021-07-17 NOTE — Patient Instructions (Signed)
[facility]  Discharge Instructions: Thank you for choosing [facility] to provide your oncology and hematology care.   If you have a lab appointment with the [facility], please go directly to the [facility] and check in at the registration area.   Wear comfortable clothing and clothing appropriate for easy access to any Portacath or PICC line.   We strive to give you quality time with your provider. You may need to reschedule your appointment if you arrive late (15 or more minutes).  Arriving late affects you and other patients whose appointments are after yours.  Also, if you miss three or more appointments without notifying the office, you may be dismissed from the clinic at the provider's discretion.      For prescription refill requests, have your pharmacy contact our office and allow 72 hours for refills to be completed.    Today you received the following chemotherapy and/or immunotherapy agents:   Bevacizumab      To help prevent nausea and vomiting after your treatment, we encourage you to take your nausea medication as directed.  BELOW ARE SYMPTOMS THAT SHOULD BE REPORTED IMMEDIATELY: *FEVER GREATER THAN 100.4 F (38 C) OR HIGHER *CHILLS OR SWEATING *NAUSEA AND VOMITING THAT IS NOT CONTROLLED WITH YOUR NAUSEA MEDICATION *UNUSUAL SHORTNESS OF BREATH *UNUSUAL BRUISING OR BLEEDING *URINARY PROBLEMS (pain or burning when urinating, or frequent urination) *BOWEL PROBLEMS (unusual diarrhea, constipation, pain near the anus) TENDERNESS IN MOUTH AND THROAT WITH OR WITHOUT PRESENCE OF ULCERS (sore throat, sores in mouth, or a toothache) UNUSUAL RASH, SWELLING OR PAIN  UNUSUAL VAGINAL DISCHARGE OR ITCHING   Items with * indicate a potential emergency and should be followed up as soon as possible or go to the Emergency Department if any problems should occur.  Please show the CHEMOTHERAPY ALERT CARD or IMMUNOTHERAPY ALERT CARD at check-in  to the Emergency Department and triage nurse.  Should you have questions after your visit or need to cancel or reschedule your appointment, please contact [facility]  Dept: [phone]  and follow the prompts.  Office hours are 8:00 a.m. to 4:30 p.m. Monday - Friday. Please note that voicemails left after 4:00 p.m. may not be returned until the following business day.  We are closed weekends and major holidays. You have access to a nurse at all times for urgent questions. Please call the main number to the clinic Dept: [phone] and follow the prompts.   For any non-urgent questions, you may also contact your provider using MyChart. We now offer e-Visits for anyone 53 and older to request care online for non-urgent symptoms. For details visit [email or web address].   Also download the MyChart app! Go to the app store, search "MyChart", open the app, select [facility], and log in with your MyChart username and password.  Due to Covid, a mask is required upon entering the hospital/clinic. If you do not have a mask, one will be given to you upon arrival. For doctor visits, patients may have 1 support person aged 70 or older with them. For treatment visits, patients cannot have anyone with them due to current Covid guidelines and our immunocompromised population.  Bevacizumab injection What is this medication? BEVACIZUMAB (be va SIZ yoo mab) is a monoclonal antibody. It is used to treatmany types of cancer. This medicine may be used for other purposes; ask your health care provider orpharmacist if you have questions. COMMON BRAND NAME(S): Avastin, MVASI, Noah Charon  What should I tell my care team before I take this medication? They need to know if you have any of these conditions: diabetes heart disease high blood pressure history of coughing up blood prior anthracycline chemotherapy (e.g., doxorubicin, daunorubicin, epirubicin) recent or ongoing radiation  therapy recent or planning to have surgery stroke an unusual or allergic reaction to bevacizumab, hamster proteins, mouse proteins, other medicines, foods, dyes, or preservatives pregnant or trying to get pregnant breast-feeding How should I use this medication? This medicine is for infusion into a vein. It is given by a health careprofessional in a hospital or clinic setting. Talk to your pediatrician regarding the use of this medicine in children.Special care may be needed. Overdosage: If you think you have taken too much of this medicine contact apoison control center or emergency room at once. NOTE: This medicine is only for you. Do not share this medicine with others. What if I miss a dose? It is important not to miss your dose. Call your doctor or health careprofessional if you are unable to keep an appointment. What may interact with this medication? Interactions are not expected. This list may not describe all possible interactions. Give your health care provider a list of all the medicines, herbs, non-prescription drugs, or dietary supplements you use. Also tell them if you smoke, drink alcohol, or use illegaldrugs. Some items may interact with your medicine. What should I watch for while using this medication? Your condition will be monitored carefully while you are receiving this medicine. You will need important blood work and urine testing done while youare taking this medicine. This medicine may increase your risk to bruise or bleed. Call your doctor orhealth care professional if you notice any unusual bleeding. Before having surgery, talk to your health care provider to make sure it is ok. This drug can increase the risk of poor healing of your surgical site or wound. You will need to stop this drug for 28 days before surgery. After surgery, wait at least 28 days before restarting this drug. Make sure the surgical site or wound is healed enough before restarting this drug. Talk to  your health careprovider if questions. Do not become pregnant while taking this medicine or for 6 months after stopping it. Women should inform their doctor if they wish to become pregnant or think they might be pregnant. There is a potential for serious side effects to an unborn child. Talk to your health care professional or pharmacist for more information. Do not breast-feed an infant while taking this medicine andfor 6 months after the last dose. This medicine has caused ovarian failure in some women. This medicine may interfere with the ability to have a child. You should talk to your doctor orhealth care professional if you are concerned about your fertility. What side effects may I notice from receiving this medication? Side effects that you should report to your doctor or health care professionalas soon as possible: allergic reactions like skin rash, itching or hives, swelling of the face, lips, or tongue chest pain or chest tightness chills coughing up blood high fever seizures severe constipation signs and symptoms of bleeding such as bloody or black, tarry stools; red or dark-brown urine; spitting up blood or brown material that looks like coffee grounds; red spots on the skin; unusual bruising or bleeding from the eye, gums, or nose signs and symptoms of a blood clot such as breathing problems; chest pain; severe, sudden headache; pain, swelling, warmth in the leg signs and  symptoms of a stroke like changes in vision; confusion; trouble speaking or understanding; severe headaches; sudden numbness or weakness of the face, arm or leg; trouble walking; dizziness; loss of balance or coordination stomach pain sweating swelling of legs or ankles vomiting weight gain Side effects that usually do not require medical attention (report to yourdoctor or health care professional if they continue or are bothersome): back pain changes in taste decreased appetite dry  skin nausea tiredness This list may not describe all possible side effects. Call your doctor for medical advice about side effects. You may report side effects to FDA at1-800-FDA-1088. Where should I keep my medication? This drug is given in a hospital or clinic and will not be stored at home. NOTE: This sheet is a summary. It may not cover all possible information. If you have questions about this medicine, talk to your doctor, pharmacist, orhealth care provider.  2022 Elsevier/Gold Standard (2019-09-05 10:50:46)

## 2021-07-17 NOTE — Progress Notes (Signed)
Jaime [name]   Telephone:([phone]) [phone] Fax:([phone]) [phone]   Clinic Follow up Note   Patient Care Team: Crist Infante, MD as PCP - General (Internal Medicine) Alla Feeling, NP as Nurse Practitioner (Nurse Practitioner) Truitt Merle, MD as Consulting Physician (Hematology) Otis Brace, MD as Referring Physician (Gastroenterology) Teodoro Spray, NP as Nurse Practitioner [facility] and Palliative Medicine)  Date of Service:  07/17/2021  CHIEF COMPLAINT: f/u of colon cancer  ASSESSMENT & PLAN:  Jaime [name] is a 87 y.o. female with   1. Adenocarcinoma of cecum with liver metastasis, stage IV, MMR normal, KRAD G12D (+)  -she presented with IDA, found to have cecum mass on imaging, colonoscopy was declined. After 3 months, she was found to have enlarging cecal mass and new liver masses. Liver biopsy confirmed metastatic colon adenocarcinoma, IHC was normal. -FO genomic sequencing showed MSI stable disease, low tumor burden, K-ras mutation positive, no other targetable mutations.  She is not a candidate for immunotherapy or EGFR inhibitor. -We agreed to proceed with single agent Xeloda, which she started 05/13/21. We attempted increase with C2 but she developed diarrhea. She now continues on 1000 mg in the morning and 1500 mg in the evening, 7 days on and 7 days off. -She started cycle 4 on 07/13/21. She is tolerating well. -I plan to also start her on bevacizumab today.  Benefit and side effects reviewed with her again, she agreed to proceed.   -Labs reviewed, adequate to proceed with first dose.     2. Low appetite, weight loss -secondary to #1 -she has low appetite and lost 28 lbs in 1 year, some of which is related to covid-19 illness. She lost 8 lbs during 04/2021 -her weight is stable lately      PLAN: -proceed to first bevacizumab today -continue Xeloda 1000 mg in the morning, 1500 mg in the evening, 7 days on and 7 days off, she will be off next week  -labs, f/u  and beva in 3 weeks    No problem-specific Assessment & Plan notes found for this encounter.   SUMMARY OF ONCOLOGIC HISTORY: Oncology History  Adenocarcinoma of colon metastatic to liver [facility])  06/2020 Miscellaneous   Patient well until + covid-19 in 06/2020 with DVT/PE at that time. Hospitalized for anticoagulation, discharged to SNF on eliquis. [facility] 08/2020. Subsequent pneumonia 10/2020 and RUL nodule new from 2017 warranting surveillance. Ongoing weight loss and worsening anemia 12/2020 Hg 7.7 heme + stool s/p RBC and IV Feraheme. CT 01/2021 showed mass in the cecum near ileocecal valve. Pt declined colonoscopy. Repeat CT 04/2021 showed enlarging colon mass and new liver masses. Liver bx confirmed metastatic colon adeno carcinoma.    11/20/2020 Imaging   CT chest with contrast -COVID-19 pneumonia follow-up  IMPRESSION: No findings suspicious for pneumonia. Lingular and right lower lobe opacities favor atelectasis.   Moderate right pleural effusion, increased. Trace left pleural effusion, new. No frank interstitial edema.   6 mm central right upper lobe nodule, unchanged from recent CT, but new from 2017. Follow-up CT chest is suggested in 6 months.   02/12/2021 Imaging   CT AP with contrast IMPRESSION: 1. Abnormal, focal wall thickening in the medial cecum, proximal to the ileocecal valve. Imaging features highly suspicious for neoplasm in light of the reported history of blood in stool. 2. No evidence for metastatic disease in the abdomen or pelvis. No ileocolic lymphadenopathy. 3. 8 mm common bile duct diameter in the head of the pancreas, upper  normal for patient age. 4. Stable right pleural effusion with collapse/consolidation in the right lower lobe comparing to CT chest 11/20/2020. 5. Left colonic diverticulosis without diverticulitis. 6. Aortic Atherosclerosis (ICD10-I70.0).   04/30/2021 Imaging   CT AP with contrast IMPRESSION: 1. Progressive infiltrative mass involving the  cecum compared with previous CT 3 months ago, consistent with progressive colon cancer. 2. Interval development of several hypoattenuating liver lesions consistent with metastatic disease. No abdominal adenopathy or signs of peritoneal carcinomatosis. 3. Progressive intra and extrahepatic biliary dilatation of undetermined etiology. Given change from previous recent CT, correlation with liver function studies recommended. 4. Stable right pleural effusion and right basilar pulmonary opacity from recent prior studies. Of note, a right upper lobe pulmonary nodule was demonstrated on chest CT 11/20/2020 for which chest CT follow-up is recommended. That is potentially a metastasis. 5.  Aortic Atherosclerosis (ICD10-I70.0).   05/07/2021 Cancer Staging   Staging form: Colon and Rectum, AJCC 8th Edition - Clinical stage from 05/07/2021: Stage Unknown (cTX, cN0, pM1) - Signed by Alla Feeling, NP on 05/13/2021 Total positive nodes: 0 Laterality: Right Sites of metastasis: Liver   05/07/2021 Initial Biopsy   Liver biopsy FINAL MICROSCOPIC DIAGNOSIS: A. LEFT LIVER LESION, NEEDLE CORE BIOPSY: - Adenocarcinoma.  ADDENDUM: Immunohistochemistry shows the adenocarcinoma is positive with cytokeratin 20 and CDX2 and is negative with cytokeratin 7, TTF-1 and Napsin A. The immunophenotype is consistent with metastatic colorectal adenocarcinoma. IHC Normal   05/07/2021 Miscellaneous   [facility]   Biomarker Findings -Microsatellite status - MS-Stable -Tumor Mutational Burden - 1 Muts/Mb  Genomic Findings -KRAS G12D -NRAS wildtype -APC R216 -ATM V455M -FAM123B R353   05/13/2021 Initial Diagnosis   Adenocarcinoma of colon metastatic to liver ([facility])   06/06/2021 Genetic Testing   Negative genetic testing:  No pathogenic variants detected on the [facility] Multi-Cancer + RNA panel. The report date is 06/06/2021.   The Multi-Cancer + RNA Panel offered by [facility] includes sequencing and/or  deletion/duplication analysis of the following 84 genes:  AIP*, ALK, APC*, ATM*, AXIN2*, BAP1*, BARD1*, BLM*, BMPR1A*, BRCA1*, BRCA2*, BRIP1*, CASR, CDC73*, CDH1*, CDK4, CDKN1B*, CDKN1C*, CDKN2A, CEBPA, CHEK2*, CTNNA1*, DICER1*, DIS3L2*, EGFR, EPCAM, FH*, FLCN*, GATA2*, GPC3, GREM1, HOXB13, HRAS, KIT, MAX*, MEN1*, MET, MITF, MLH1*, MSH2*, MSH3*, MSH6*, MUTYH*, NBN*, NF1*, NF2*, NTHL1*, PALB2*, PDGFRA, PHOX2B, PMS2*, POLD1*, POLE*, POT1*, PRKAR1A*, PTCH1*, PTEN*, RAD50*, RAD51C*, RAD51D*, RB1*, RECQL4, RET, RUNX1*, SDHA*, SDHAF2*, SDHB*, SDHC*, SDHD*, SMAD4*, SMARCA4*, SMARCB1*, SMARCE1*, STK11*, SUFU*, TERC, TERT, TMEM127*, Tp53*, TSC1*, TSC2*, VHL*, WRN*, and WT1.  RNA analysis is performed for * genes.   07/17/2021 -  Chemotherapy      Patient is on Antibody Plan: COLORECTAL BEVACIZUMAB Q21D        CURRENT THERAPY:  first line single agent Xeloda 1000 mg AM/1500 mg PM for 1 week on/1 week off  INTERVAL HISTORY:  ANTIA RAHAL is here for a follow up of colon cancer. She was last seen by me on 06/15/21 and via telephone on 06/24/21. She presents to the clinic accompanied by her daughter. She is currently finishing her third cycle. She will begin cycle 4 on Monday, 8/29. She feels things are stable. She reports new hip pain in the last few days. She notes the pain is more towards the back and bothers her more when she stands up or if she moves a certain way. She notes it is somewhat bothersome when she sits. She reports cramping with a bowel movement. She notes it lingers for a while afterwards but resolves  on its own.   All other systems were reviewed with the patient and are negative.  MEDICAL HISTORY:  Past Medical History:  Diagnosis Date   Arthritis    knees   Complication of anesthesia    problem 40 yrs ago Doctor told her anesthesia drugs caused a reaction, BP drops   Family history of breast cancer    Family history of ovarian cancer    Family history of pancreatic cancer     Hypertension     SURGICAL HISTORY: Past Surgical History:  Procedure Laterality Date   ABDOMINAL HYSTERECTOMY     CATARACT EXTRACTION W/PHACO Left 08/09/2018   Procedure: CATARACT EXTRACTION PHACO AND INTRAOCULAR LENS PLACEMENT ([place])  ISTENT AND ISTENT INJECT LEFT;  Surgeon: Leandrew Koyanagi, MD;  Location: [facility];  Service: Ophthalmology;  Laterality: Left;   CHOLECYSTECTOMY     EYE SURGERY     cataract with lens implant-right   INSERTION OF ANTERIOR SEGMENT AQUEOUS DRAINAGE DEVICE (ISTENT) Left 08/09/2018   Procedure: INSERTION OF ANTERIOR SEGMENT AQUEOUS DRAINAGE DEVICE (ISTENT);  Surgeon: Leandrew Koyanagi, MD;  Location: [facility];  Service: Ophthalmology;  Laterality: Left;   TOTAL KNEE ARTHROPLASTY Right 02/27/2014   Procedure: RIGHT TOTAL KNEE ARTHROPLASTY;  Surgeon: Tobi Bastos, MD;  Location: [facility];  Service: Orthopedics;  Laterality: Right;   TUBAL LIGATION      I have reviewed the social history and family history with the patient and they are unchanged from previous note.  ALLERGIES:  is allergic to cefdinir, codeine, ibandronic acid, meperidine hcl, adhesive [tape], sulfa antibiotics, and sulfonamide derivatives.  MEDICATIONS:  Current Outpatient Medications  Medication Sig Dispense Refill   acetaminophen (TYLENOL) 500 MG tablet Take 500 mg by mouth every 6 (six) hours as needed for moderate pain or headache.     albuterol (VENTOLIN HFA) 108 (90 Base) MCG/ACT inhaler Inhale 2 puffs into the lungs every 4 (four) hours as needed for wheezing or shortness of breath.     brimonidine (ALPHAGAN) 0.2 % ophthalmic solution Place 1 drop into the left eye 2 (two) times daily.     buPROPion (WELLBUTRIN XL) 150 MG 24 hr tablet Take 150 mg by mouth daily.      capecitabine (XELODA) 500 MG tablet Take 3 tablets in morning and 3 tablets in evening every 12 hours. Take for 7 days on and 7 days off. Take within 30 minutes after meals. 84 tablet 0    capecitabine (XELODA) 500 MG tablet Take 2 tablets by mouth in morning and 3 tablets by mouth in evening 12 hours apart. Take for 7 days on and 7 days off. Take within 30 minutes after meals. 35 tablet 1   cholecalciferol (VITAMIN D3) 25 MCG (1000 UNIT) tablet Take 1,000 Units by mouth daily.     dorzolamide-timolol (COSOPT) 22.3-6.8 MG/ML ophthalmic solution Place 1 drop into both eyes 2 (two) times daily.     escitalopram (LEXAPRO) 5 MG tablet Take 5 mg by mouth daily.     famotidine (PEPCID) 20 MG tablet TAKE 1 TABLET BY MOUTH EVERY DAY 30 tablet 1   Fe Fum-FePoly-Vit C-Vit B3 (INTEGRA) 62.5-62.5-40-3 MG CAPS Take 1 capsule by mouth daily.     fexofenadine (ALLEGRA) 180 MG tablet Take 180 mg by mouth daily as needed for allergies or rhinitis.     fluticasone (FLONASE) 50 MCG/ACT nasal spray Place 1 spray into both nostrils daily as needed for allergies or rhinitis.     furosemide (LASIX) 20 MG  tablet Take one tablet (50m total) by mouth every other day, alternating with two tablets (493mtotal) by mouth every other day. (Patient taking differently: Take 20-40 mg by mouth See admin instructions. Take one tablet (2029motal) by mouth every other day, alternating with two tablets (82m6mtal) by mouth every other day.) 132 tablet 3   Guaifenesin (MUCINEX MAXIMUM STRENGTH) 1200 MG TB12 Take 1,200 mg by mouth 2 (two) times daily as needed (congestion).     KLOR-CON M20 20 MEQ tablet Take 10 mEq by mouth every Monday, Wednesday, and Friday.     latanoprost (XALATAN) 0.005 % ophthalmic solution Place 1 drop into both eyes at bedtime.     loperamide (IMODIUM A-D) 2 MG tablet Take 2-4 mg by mouth 4 (four) times daily as needed for diarrhea or loose stools.     Multiple Vitamins-Minerals (ADULT GUMMY PO) Take 2 capsules by mouth daily.     ondansetron (ZOFRAN) 8 MG tablet Take 1 tablet (8 mg total) by mouth every 8 (eight) hours as needed for nausea or vomiting. 20 tablet 1   rosuvastatin (CRESTOR) 10 MG  tablet Take 10 mg by mouth daily.     vitamin C (ASCORBIC ACID) 500 MG tablet Take 500 mg by mouth daily.     No current facility-administered medications for this visit.    PHYSICAL EXAMINATION: ECOG PERFORMANCE STATUS: 2 - Symptomatic, <50% confined to bed  Vitals:   07/17/21 1421  BP: (!) 165/64  Pulse: 62  Resp: 18  Temp: 98.4 F (36.9 C)  SpO2: 100%   Wt Readings from Last 3 Encounters:  07/17/21 181 lb 14.4 oz (82.5 kg)  06/15/21 180 lb 12.8 oz (82 kg)  05/27/21 179 lb 6.4 oz (81.4 kg)     GENERAL:alert, no distress and comfortable SKIN: skin color normal, no rashes or significant lesions EYES: normal, Conjunctiva are pink and non-injected, sclera clear  NEURO: alert & oriented x 3 with fluent speech  LABORATORY DATA:  I have reviewed the data as listed CBC Latest Ref Rng & Units 07/17/2021 06/15/2021 05/21/2021  WBC 4.0 - 10.5 K/uL 7.6 7.5 6.9  Hemoglobin 12.0 - 15.0 g/dL 11.2(L) 10.9(L) 11.7(L)  Hematocrit 36.0 - 46.0 % 34.5(L) 34.1(L) 36.9  Platelets 150 - 400 K/uL 217 290 338     CMP Latest Ref Rng & Units 07/17/2021 06/15/2021 05/21/2021  Glucose 70 - 99 mg/dL 94 95 92  BUN 8 - 23 mg/dL 21 22 22   Creatinine 0.44 - 1.00 mg/dL 1.01(H) 0.89 0.97  Sodium 135 - 145 mmol/L 141 143 141  Potassium 3.5 - 5.1 mmol/L 4.5 4.5 4.1  Chloride 98 - 111 mmol/L 106 109 104  CO2 22 - 32 mmol/L 27 27 29   Calcium 8.9 - 10.3 mg/dL 9.2 9.5 9.9  Total Protein 6.5 - 8.1 g/dL 7.1 6.8 8.0  Total Bilirubin 0.3 - 1.2 mg/dL 0.7 0.4 0.5  Alkaline Phos 38 - 126 U/L 64 59 67  AST 15 - 41 U/L 20 17 21   ALT 0 - 44 U/L 8 9 15       RADIOGRAPHIC STUDIES: I have personally reviewed the radiological images as listed and agreed with the findings in the report. No results found.    No orders of the defined types were placed in this encounter.  All questions were answered. The patient knows to call the clinic with any problems, questions or concerns. No barriers to learning was  detected. The total time spent in the appointment was  30 minutes.     Truitt Merle, MD 07/17/2021   I, Wilburn Mylar, am acting as scribe for Truitt Merle, MD.   I have reviewed the above documentation for accuracy and completeness, and I agree with the above.

## 2021-07-20 ENCOUNTER — Telehealth: Payer: Self-pay | Admitting: Hematology

## 2021-07-20 NOTE — Telephone Encounter (Signed)
Unable to leave voicemail with follow-up appointments per 8/26 los. Mailed calendar.

## 2021-07-21 ENCOUNTER — Other Ambulatory Visit: Payer: Self-pay | Admitting: Hematology

## 2021-07-22 ENCOUNTER — Telehealth: Payer: Self-pay

## 2021-07-22 DIAGNOSIS — E785 Hyperlipidemia, unspecified: Secondary | ICD-10-CM | POA: Diagnosis not present

## 2021-07-22 NOTE — Telephone Encounter (Signed)
143 pm.  Incoming call from patient.  Patient confirms fall on Monday.  She is agreeable to a home visit tomorrow at 11 am.

## 2021-07-22 NOTE — Telephone Encounter (Signed)
140 pm.  Daughter Baker Janus has left a message requesting a visit be made to patient.  Return calls made to both daughter and patient.  Messages have been left requesting a call back.   Response pending.

## 2021-07-23 ENCOUNTER — Other Ambulatory Visit: Payer: Self-pay

## 2021-07-23 ENCOUNTER — Other Ambulatory Visit: Payer: Medicare Other

## 2021-07-23 VITALS — BP 124/52 | HR 58 | Temp 98.1°F | Resp 18

## 2021-07-23 DIAGNOSIS — Z515 Encounter for palliative care: Secondary | ICD-10-CM

## 2021-07-23 NOTE — Progress Notes (Signed)
COMMUNITY PALLIATIVE CARE SW NOTE  PATIENT NAME: Jaime [name] DOB: 10-07-1932 MRN: [id number]  PRIMARY CARE PROVIDER: Crist Infante, MD  RESPONSIBLE PARTY:  Acct ID - Guarantor Home Phone Work Phone Relationship Acct Type  [id number] Rhae [phone]  Self P/F     [facility] [facility], [place], [place] [address]     PLAN OF CARE and INTERVENTIONS:             GOALS OF CARE/ ADVANCE CARE PLANNING:  Patient is a DNR. Patient shares her daughter Baker Janus, is HCPOA. Patient's goals include to maximize quality of life and symptom management and avoid hospitalizations.  2.         SOCIAL/EMOTIONAL/SPIRITUAL ASSESSMENT/ INTERVENTIONS:  SW and RN met with patient in patients home for monthly follow up visit for [facility] NP. Patient lives in a story home alone. Daughter is supportive and visits daily. Patient has a caregiver M-F 9am-1pm from [employer]. Caregiver present during visit.  Patient is 87 y.o. year old female with colon cancer with metastasis to liver. Patient is being followed by oncology, Dr. Burr Medico. Patient shares that she was recently informed of her cancer dx a little over a month ago.  Patient's cancer incurable. Patient recently had a fall in her bedroom, minor injuries to bilateral lower legs. Patient declined for EMS to come to the home due to not wanting to be transported to the hospital.  Patient shares that she was moving to quickly and loss her balance. Fall precautions discussed with patient, patient stated understanding. Patient has life alert system. Patient uses RW for ambulation. Patient has hand rails through the home for assistance as well.   Mini assessment of the home done. Patient has ramp outside of home. Patient was one small threshold to maneuver from her dining area/sitting room to the remainder of the home - this entrance also has grab bars on each side of doorway, rest of the home is all one level. Patient is able to ambulate to bedroom and restroom. Patient is able to  maneuver RW throughout home and in bathroom with no issues. Patient has shower and grab bars in the bathroom and elevated toilet seat. Patient provided education of safely maneuvering around home and talking her time. Patient shares she has had 2 falls since Oct 2021. Patient is open to [facility] PT, if recommended and required by her provider.   Patient share her appetite is fair. Patient shares that she does not have much of an appetite. Patient consumes maybe 1 full meal a day. Patient also has fairlife high protein strawberry flavored drinks, she drinks every so often when she feels like it. Patient does snack every now and then. No concerns or issues with sleeping. Patient shared that she had COVID August of 2021 and last her sense of smell and taste during that time, she has her smell back but her taste still comes and goes.  RN reviewed medications and took vitals. Patient swelling to bilateral lower legs. RN provided education around CHF and S/S to look for in regard to fluid retention.  PC also advised patient to weigh self. Patient is currently taking Xeloda for cancer and Imodium for diarrhea associated with side effect of Xeloda.   Psychosocial assessment completed. No physical needs identified at this time. SW discussed counseling support for patient due to recent passing of her husband last year and new dx of cancer. Patient declined services at this time. Patient appears to be coping well on the surface  however, seems to have an avoidant adjustment style to recent changes in her life. Ongoing support will continued to be offered.  SW discussed goals, reviewed care plan, provided emotional support, used active and reflective listening. Patient shared that her wishes and goals are to remain in her home for as long as safely possible. Patient shares that if she requires more assistance in the home then would be fine with additional caregiver support. Hospice services and support also discussed. Patient  shares that her last option would be to transition to the hospice home or a SNF, if absolutely deemed necessary. Long term planning and goals of care to be discussed again in more detail at next visit with daughter present.   Palliative care will continue to monitor and assist with long term care planning as needed.   3.         PATIENT/CAREGIVER EDUCATION/ COPING:  Patient A&O x3, able to answer questions appropriately. Patient in good spirits today. Patient denies any anxiety or depression at this time. PHQ-9 of 2, which suggest minimal depression, but patient is functional and does not require treatment at this time. Patients daughter is supportive. Patient shares that she also have other family members that live not far from her that checks on her every now and then.  4.         PERSONAL EMERGENCY PLAN:  Patient will call 9-1-1 for emergencies. Patient also had life alert button.  5.         COMMUNITY RESOURCES COORDINATION/ HEALTH CARE NAVIGATION:  Patient and daughter manages her care.  6.         FINANCIAL/LEGAL CONCERNS/INTERVENTIONS:  None.     SOCIAL HX:  Social History   Tobacco Use   Smoking status: Never   Smokeless tobacco: Never  Substance Use Topics   Alcohol use: No    CODE STATUS: DNR  ADVANCED DIRECTIVES: Y MOST FORM COMPLETE:  N HOSPICE EDUCATION PROVIDED: Y  PPS: Patient is independent with all ADL's at this time Patient is ale to cook small things, but does not due to caregivers and daughter providing meals. Patient is A&O x3 with average insight and judgement.    Time spent: 1hr 15 min       Jaime, [name]

## 2021-07-23 NOTE — Progress Notes (Signed)
PATIENT NAME: Jaime [name] DOB: [birth year] MRN: [id number]  PRIMARY CARE PROVIDER: Crist Infante, MD  RESPONSIBLE PARTY:  Acct ID - Guarantor Home Phone Work Phone Relationship Acct Type  [id number] Rhae [phone]  Self P/F     [address], [place], [place] [address]    PLAN OF CARE and INTERVENTIONS:               1.  GOALS OF CARE/ ADVANCE CARE PLANNING:  Patient confirms DNR status.  She desires to remain out of the hospital if possible. Patient desires to remain home for as long as possible.  Patient desires comfort measures.                2.  PATIENT/CAREGIVER EDUCATION:  Congestive Heart Failure               4. PERSONAL EMERGENCY PLAN:  Activate 911 for emergencies.                5.  DISEASE STATUS:  Joint visit with [name], SW.  Patient sitting in her recliner chair with private sitter Caren Griffins present.  Patient sustained a fall earlier this week.  Patient stated she was getting ready for bed when she fell.  She called her daughter who obtained assistance from a friend to assist patient.  Patient declined ED visit.  She did hit the left side of her face.  No headaches, dizziness, nausea or vomiting is reported.  Patient has a life alert that she either wears or keeps on her walker.  Patient notes a poor to fair appetite. She reports having a donut and granola bar for breakfast.  Occasionally she will have a sandwich for lunch or will skip this meal.  She has dinner with her daughter daily.  Patient states she does not normally snack during the day.   Fair life high protein supplement drinks are being consumed as desired by patient. Patient notes her current weight is 181 lbs and states she has lost about 28 lbs in the last 3-6 months.  Patient has a private caregiver that is with her from 9 am- 1 pm.  Daughter is present during the evening hours.  Daughter shared that she is looking into an assisted living facility for 24/7 care for her mother.  She will be visiting a  facility on Friday.  Discussed CHF with patient and education provided on signs and symptoms to watch for and monitoring sodium intake.  Patient is not currently weighing herself but she encouraged to monitor and notify her cardiology with weight gain.  Patient is currently being seen by oncology for Adenocarcinoma of cecum with liver metastasis, stage IV an is on Xeloda.  Patient reports mostly formed, regular bowel movements.  She reports occasional diarrhea with her most recent bout this am.  She is taking an anti-diarrhea as needed.  Patient understands her diagnosis is not curative.  She notes she would like to remain home for as long as possible but is open to nursing home placement when she is no longer able to be at home. Patient is open to hospice when appropriate.      HISTORY OF PRESENT ILLNESS:  87 year old female with a history of CHF.  Patient is being followed by Palliative Care every 4-8 weeks and PRN.  CODE STATUS: DNR-form in the home.  Will upload into [employer]. ADVANCED DIRECTIVES: No MOST FORM: No PPS: 50%   PHYSICAL EXAM:   VITALS: Today's Vitals  07/23/21 1107  BP: (!) 124/52  Pulse: (!) 58  Resp: 18  Temp: 98.1 F (36.7 C)  SpO2: 97%    LUNGS: clear to auscultation  CARDIAC: Cor RRR}  EXTREMITIES: 2+ pitting edema, TED hose in place. SKIN: Skin color, texture, turgor normal. No rashes or lesions or ecchymoses - face, lower leg(s) bilateral  NEURO: positive for gait problems       Lorenza Burton, RN

## 2021-07-29 DIAGNOSIS — Z0000 Encounter for general adult medical examination without abnormal findings: Secondary | ICD-10-CM | POA: Diagnosis not present

## 2021-07-29 DIAGNOSIS — N318 Other neuromuscular dysfunction of bladder: Secondary | ICD-10-CM | POA: Diagnosis not present

## 2021-07-29 DIAGNOSIS — R269 Unspecified abnormalities of gait and mobility: Secondary | ICD-10-CM | POA: Diagnosis not present

## 2021-07-29 DIAGNOSIS — I87319 Chronic venous hypertension (idiopathic) with ulcer of unspecified lower extremity: Secondary | ICD-10-CM | POA: Diagnosis not present

## 2021-07-29 DIAGNOSIS — M179 Osteoarthritis of knee, unspecified: Secondary | ICD-10-CM | POA: Diagnosis not present

## 2021-07-29 DIAGNOSIS — Z23 Encounter for immunization: Secondary | ICD-10-CM | POA: Diagnosis not present

## 2021-07-29 DIAGNOSIS — Z1339 Encounter for screening examination for other mental health and behavioral disorders: Secondary | ICD-10-CM | POA: Diagnosis not present

## 2021-07-29 DIAGNOSIS — Z1331 Encounter for screening for depression: Secondary | ICD-10-CM | POA: Diagnosis not present

## 2021-07-29 DIAGNOSIS — I872 Venous insufficiency (chronic) (peripheral): Secondary | ICD-10-CM | POA: Diagnosis not present

## 2021-07-29 DIAGNOSIS — M8580 Other specified disorders of bone density and structure, unspecified site: Secondary | ICD-10-CM | POA: Diagnosis not present

## 2021-07-29 DIAGNOSIS — R911 Solitary pulmonary nodule: Secondary | ICD-10-CM | POA: Diagnosis not present

## 2021-07-29 DIAGNOSIS — R82998 Other abnormal findings in urine: Secondary | ICD-10-CM | POA: Diagnosis not present

## 2021-07-29 DIAGNOSIS — C189 Malignant neoplasm of colon, unspecified: Secondary | ICD-10-CM | POA: Diagnosis not present

## 2021-07-30 ENCOUNTER — Other Ambulatory Visit: Payer: Self-pay

## 2021-07-30 DIAGNOSIS — C787 Secondary malignant neoplasm of liver and intrahepatic bile duct: Secondary | ICD-10-CM

## 2021-07-30 DIAGNOSIS — C189 Malignant neoplasm of colon, unspecified: Secondary | ICD-10-CM

## 2021-07-30 MED ORDER — CAPECITABINE 500 MG PO TABS: ORAL_TABLET | ORAL | 1 refills | Status: DC

## 2021-08-05 ENCOUNTER — Other Ambulatory Visit: Payer: Medicare Other | Admitting: Hospice

## 2021-08-05 ENCOUNTER — Other Ambulatory Visit: Payer: Self-pay

## 2021-08-06 ENCOUNTER — Encounter: Payer: Self-pay | Admitting: Hematology

## 2021-08-08 DIAGNOSIS — D649 Anemia, unspecified: Secondary | ICD-10-CM | POA: Diagnosis not present

## 2021-08-08 DIAGNOSIS — Z85828 Personal history of other malignant neoplasm of skin: Secondary | ICD-10-CM | POA: Diagnosis not present

## 2021-08-08 DIAGNOSIS — E669 Obesity, unspecified: Secondary | ICD-10-CM | POA: Diagnosis not present

## 2021-08-08 DIAGNOSIS — I872 Venous insufficiency (chronic) (peripheral): Secondary | ICD-10-CM | POA: Diagnosis not present

## 2021-08-08 DIAGNOSIS — K5790 Diverticulosis of intestine, part unspecified, without perforation or abscess without bleeding: Secondary | ICD-10-CM | POA: Diagnosis not present

## 2021-08-08 DIAGNOSIS — J302 Other seasonal allergic rhinitis: Secondary | ICD-10-CM | POA: Diagnosis not present

## 2021-08-08 DIAGNOSIS — Z86711 Personal history of pulmonary embolism: Secondary | ICD-10-CM | POA: Diagnosis not present

## 2021-08-08 DIAGNOSIS — Z79899 Other long term (current) drug therapy: Secondary | ICD-10-CM | POA: Diagnosis not present

## 2021-08-08 DIAGNOSIS — I10 Essential (primary) hypertension: Secondary | ICD-10-CM | POA: Diagnosis not present

## 2021-08-08 DIAGNOSIS — K589 Irritable bowel syndrome without diarrhea: Secondary | ICD-10-CM | POA: Diagnosis not present

## 2021-08-08 DIAGNOSIS — H409 Unspecified glaucoma: Secondary | ICD-10-CM | POA: Diagnosis not present

## 2021-08-08 DIAGNOSIS — C189 Malignant neoplasm of colon, unspecified: Secondary | ICD-10-CM | POA: Diagnosis not present

## 2021-08-08 DIAGNOSIS — F339 Major depressive disorder, recurrent, unspecified: Secondary | ICD-10-CM | POA: Diagnosis not present

## 2021-08-08 DIAGNOSIS — Z6830 Body mass index (BMI) 30.0-30.9, adult: Secondary | ICD-10-CM | POA: Diagnosis not present

## 2021-08-08 DIAGNOSIS — E785 Hyperlipidemia, unspecified: Secondary | ICD-10-CM | POA: Diagnosis not present

## 2021-08-08 DIAGNOSIS — D860 Sarcoidosis of lung: Secondary | ICD-10-CM | POA: Diagnosis not present

## 2021-08-08 DIAGNOSIS — Z9181 History of falling: Secondary | ICD-10-CM | POA: Diagnosis not present

## 2021-08-08 DIAGNOSIS — M1712 Unilateral primary osteoarthritis, left knee: Secondary | ICD-10-CM | POA: Diagnosis not present

## 2021-08-08 DIAGNOSIS — Z8616 Personal history of COVID-19: Secondary | ICD-10-CM | POA: Diagnosis not present

## 2021-08-08 DIAGNOSIS — M8580 Other specified disorders of bone density and structure, unspecified site: Secondary | ICD-10-CM | POA: Diagnosis not present

## 2021-08-08 DIAGNOSIS — Z96651 Presence of right artificial knee joint: Secondary | ICD-10-CM | POA: Diagnosis not present

## 2021-08-10 ENCOUNTER — Other Ambulatory Visit: Payer: Self-pay

## 2021-08-10 ENCOUNTER — Encounter: Payer: Self-pay | Admitting: Hematology

## 2021-08-10 ENCOUNTER — Inpatient Hospital Stay: Payer: Medicare Other | Attending: Nurse Practitioner

## 2021-08-10 ENCOUNTER — Inpatient Hospital Stay (HOSPITAL_BASED_OUTPATIENT_CLINIC_OR_DEPARTMENT_OTHER): Payer: Medicare Other | Admitting: Hematology

## 2021-08-10 ENCOUNTER — Encounter: Payer: Self-pay | Admitting: *Deleted

## 2021-08-10 ENCOUNTER — Inpatient Hospital Stay: Payer: Medicare Other

## 2021-08-10 VITALS — BP 176/65 | HR 62 | Ht 65.0 in | Wt 179.6 lb

## 2021-08-10 VITALS — BP 198/72 | HR 64 | Temp 98.5°F | Resp 17

## 2021-08-10 DIAGNOSIS — C189 Malignant neoplasm of colon, unspecified: Secondary | ICD-10-CM

## 2021-08-10 DIAGNOSIS — C787 Secondary malignant neoplasm of liver and intrahepatic bile duct: Secondary | ICD-10-CM | POA: Diagnosis not present

## 2021-08-10 DIAGNOSIS — Z5111 Encounter for antineoplastic chemotherapy: Secondary | ICD-10-CM | POA: Insufficient documentation

## 2021-08-10 DIAGNOSIS — C180 Malignant neoplasm of cecum: Secondary | ICD-10-CM | POA: Insufficient documentation

## 2021-08-10 LAB — CMP (CANCER CENTER ONLY)
ALT: 9 U/L (ref 0–44)
AST: 19 U/L (ref 15–41)
Albumin: 3.6 g/dL (ref 3.5–5.0)
Alkaline Phosphatase: 80 U/L (ref 38–126)
Anion gap: 8 (ref 5–15)
BUN: 19 mg/dL (ref 8–23)
CO2: 27 mmol/L (ref 22–32)
Calcium: 9.8 mg/dL (ref 8.9–10.3)
Chloride: 108 mmol/L (ref 98–111)
Creatinine: 0.98 mg/dL (ref 0.44–1.00)
GFR, Estimated: 56 mL/min — ABNORMAL LOW (ref >60)
Glucose, Bld: 90 mg/dL (ref 70–99)
Potassium: 4.4 mmol/L (ref 3.5–5.1)
Sodium: 143 mmol/L (ref 135–145)
Total Bilirubin: 0.7 mg/dL (ref 0.3–1.2)
Total Protein: 7.2 g/dL (ref 6.5–8.1)

## 2021-08-10 LAB — CBC WITH DIFFERENTIAL (CANCER CENTER ONLY)
Abs Immature Granulocytes: 0.02 10*3/uL (ref 0.00–0.07)
Basophils Absolute: 0 10*3/uL (ref 0.0–0.1)
Basophils Relative: 0 %
Eosinophils Absolute: 0.1 10*3/uL (ref 0.0–0.5)
Eosinophils Relative: 1 %
HCT: 35.1 % — ABNORMAL LOW (ref 36.0–46.0)
Hemoglobin: 11.4 g/dL — ABNORMAL LOW (ref 12.0–15.0)
Immature Granulocytes: 0 %
Lymphocytes Relative: 37 %
Lymphs Abs: 2.7 10*3/uL (ref 0.7–4.0)
MCH: 31.1 pg (ref 26.0–34.0)
MCHC: 32.5 g/dL (ref 30.0–36.0)
MCV: 95.9 fL (ref 80.0–100.0)
Monocytes Absolute: 1 10*3/uL (ref 0.1–1.0)
Monocytes Relative: 14 %
Neutro Abs: 3.3 10*3/uL (ref 1.7–7.7)
Neutrophils Relative %: 48 %
Platelet Count: 251 10*3/uL (ref 150–400)
RBC: 3.66 MIL/uL — ABNORMAL LOW (ref 3.87–5.11)
RDW: 17.4 % — ABNORMAL HIGH (ref 11.5–15.5)
WBC Count: 7.1 10*3/uL (ref 4.0–10.5)
nRBC: 0 % (ref 0.0–0.2)

## 2021-08-10 LAB — CEA (IN HOUSE-CHCC): CEA (CHCC-In House): 13.29 ng/mL — ABNORMAL HIGH (ref 0.00–5.00)

## 2021-08-10 LAB — FERRITIN: Ferritin: 90 ng/mL (ref 11–307)

## 2021-08-10 MED ORDER — SODIUM CHLORIDE 0.9 % IV SOLN: Freq: Once | INTRAVENOUS | Status: AC

## 2021-08-10 MED ORDER — SODIUM CHLORIDE 0.9 % IV SOLN
7.5000 mg/kg | Freq: Once | INTRAVENOUS | Status: AC
  Administered 2021-08-10: 600 mg via INTRAVENOUS
  Filled 2021-08-10: qty 16

## 2021-08-10 NOTE — Progress Notes (Signed)
OK to trt with elevated BP per Dr. Burr Medico

## 2021-08-10 NOTE — Patient Instructions (Signed)
[facility]  Discharge Instructions: Thank you for choosing [facility] to provide your oncology and hematology care.   If you have a lab appointment with the [facility], please go directly to the [facility] and check in at the registration area.   Wear comfortable clothing and clothing appropriate for easy access to any Portacath or PICC line.   We strive to give you quality time with your provider. You may need to reschedule your appointment if you arrive late (15 or more minutes).  Arriving late affects you and other patients whose appointments are after yours.  Also, if you miss three or more appointments without notifying the office, you may be dismissed from the clinic at the provider's discretion.      For prescription refill requests, have your pharmacy contact our office and allow 72 hours for refills to be completed.    Today you received the following chemotherapy and/or immunotherapy agents: MVASI   To help prevent nausea and vomiting after your treatment, we encourage you to take your nausea medication as directed.  BELOW ARE SYMPTOMS THAT SHOULD BE REPORTED IMMEDIATELY: *FEVER GREATER THAN 100.4 F (38 C) OR HIGHER *CHILLS OR SWEATING *NAUSEA AND VOMITING THAT IS NOT CONTROLLED WITH YOUR NAUSEA MEDICATION *UNUSUAL SHORTNESS OF BREATH *UNUSUAL BRUISING OR BLEEDING *URINARY PROBLEMS (pain or burning when urinating, or frequent urination) *BOWEL PROBLEMS (unusual diarrhea, constipation, pain near the anus) TENDERNESS IN MOUTH AND THROAT WITH OR WITHOUT PRESENCE OF ULCERS (sore throat, sores in mouth, or a toothache) UNUSUAL RASH, SWELLING OR PAIN  UNUSUAL VAGINAL DISCHARGE OR ITCHING   Items with * indicate a potential emergency and should be followed up as soon as possible or go to the Emergency Department if any problems should occur.  Please show the CHEMOTHERAPY ALERT CARD or IMMUNOTHERAPY ALERT CARD at check-in to the  Emergency Department and triage nurse.  Should you have questions after your visit or need to cancel or reschedule your appointment, please contact [facility]  Dept: [phone]  and follow the prompts.  Office hours are 8:00 a.m. to 4:30 p.m. Monday - Friday. Please note that voicemails left after 4:00 p.m. may not be returned until the following business day.  We are closed weekends and major holidays. You have access to a nurse at all times for urgent questions. Please call the main number to the clinic Dept: [phone] and follow the prompts.   For any non-urgent questions, you may also contact your provider using MyChart. We now offer e-Visits for anyone 35 and older to request care online for non-urgent symptoms. For details visit [email or web address].   Also download the MyChart app! Go to the app store, search "MyChart", open the app, select [facility], and log in with your MyChart username and password.  Due to Covid, a mask is required upon entering the hospital/clinic. If you do not have a mask, one will be given to you upon arrival. For doctor visits, patients may have 1 support person aged 29 or older with them. For treatment visits, patients cannot have anyone with them due to current Covid guidelines and our immunocompromised population.

## 2021-08-10 NOTE — Progress Notes (Signed)
[place]   [phone] Fax:([phone]   Clinic Follow up Note   Patient Care Team: Crist Infante, MD as PCP - General (Internal Medicine) Alla Feeling, NP as Nurse Practitioner (Nurse Practitioner) Truitt Merle, MD as Consulting Physician (Hematology) Otis Brace, MD as Referring Physician (Gastroenterology) Teodoro Spray, NP as Nurse Practitioner [facility] and Palliative Medicine)  Date of Service:  08/10/2021  CHIEF COMPLAINT: f/u of metastatic colon cancer  CURRENT THERAPY:  First-line Xeloda 1000 mg AM/1500 mg PM for 1 week on/1 week off -Bevacizumab added 07/17/21  ASSESSMENT & PLAN:  Jaime [name] is a 87 y.o. female with   1. Adenocarcinoma of cecum with liver metastasis, stage IV, MMR normal, KRAD G12D (+)  -she presented with IDA, found to have cecum mass on imaging, colonoscopy was declined. After 3 months, she was found to have enlarging cecal mass and new liver masses. Liver biopsy confirmed metastatic colon adenocarcinoma, IHC was normal. -FO genomic sequencing showed MSI stable disease, low tumor burden, K-ras mutation positive, no other targetable mutations.  She is not a candidate for immunotherapy or EGFR inhibitor. -We agreed to proceed with single agent Xeloda, which she started 05/13/21. We attempted increase with C2 but she developed diarrhea. She now continues on 1000 mg in the morning and 1500 mg in the evening, 7 days on and 7 days off. -She started bevacizumab at her last visit. She tolerated relatively well with mild fatigue. -Labs reviewed, adequate to proceed with second dose.   -Her BP is high today, I told her to monitor at home and may add amlodipine if needed    2. Low appetite, weight loss -secondary to #1 -she has low appetite and lost 28 lbs in 1 year, some of which is related to covid-19 illness. She lost 8 lbs during 04/2021 -her weight is overall stable lately      PLAN: -proceed to C2 bevacizumab  today -continue Xeloda 1000 mg in the morning, 1500 mg in the evening, 7 days on and 7 days off  -labs, f/u and beva in 3 weeks    No problem-specific Assessment & Plan notes found for this encounter.   SUMMARY OF ONCOLOGIC HISTORY: Oncology History  Adenocarcinoma of colon metastatic to liver [facility])  06/2020 Miscellaneous   Patient well until + covid-19 in 06/2020 with DVT/PE at that time. Hospitalized for anticoagulation, discharged to SNF on eliquis. [facility] 08/2020. Subsequent pneumonia 10/2020 and RUL nodule new from 2017 warranting surveillance. Ongoing weight loss and worsening anemia 12/2020 Hg 7.7 heme + stool s/p RBC and IV Feraheme. CT 01/2021 showed mass in the cecum near ileocecal valve. Pt declined colonoscopy. Repeat CT 04/2021 showed enlarging colon mass and new liver masses. Liver bx confirmed metastatic colon adeno carcinoma.    11/20/2020 Imaging   CT chest with contrast -COVID-19 pneumonia follow-up  IMPRESSION: No findings suspicious for pneumonia. Lingular and right lower lobe opacities favor atelectasis.   Moderate right pleural effusion, increased. Trace left pleural effusion, new. No frank interstitial edema.   6 mm central right upper lobe nodule, unchanged from recent CT, but new from 2017. Follow-up CT chest is suggested in 6 months.   02/12/2021 Imaging   CT AP with contrast IMPRESSION: 1. Abnormal, focal wall thickening in the medial cecum, proximal to the ileocecal valve. Imaging features highly suspicious for neoplasm in light of the reported history of blood in stool. 2. No evidence for metastatic disease in the abdomen or pelvis. No ileocolic  lymphadenopathy. 3. 8 mm common bile duct diameter in the head of the pancreas, upper normal for patient age. 4. Stable right pleural effusion with collapse/consolidation in the right lower lobe comparing to CT chest 11/20/2020. 5. Left colonic diverticulosis without diverticulitis. 6. Aortic Atherosclerosis  (ICD10-I70.0).   04/30/2021 Imaging   CT AP with contrast IMPRESSION: 1. Progressive infiltrative mass involving the cecum compared with previous CT 3 months ago, consistent with progressive colon cancer. 2. Interval development of several hypoattenuating liver lesions consistent with metastatic disease. No abdominal adenopathy or signs of peritoneal carcinomatosis. 3. Progressive intra and extrahepatic biliary dilatation of undetermined etiology. Given change from previous recent CT, correlation with liver function studies recommended. 4. Stable right pleural effusion and right basilar pulmonary opacity from recent prior studies. Of note, a right upper lobe pulmonary nodule was demonstrated on chest CT 11/20/2020 for which chest CT follow-up is recommended. That is potentially a metastasis. 5.  Aortic Atherosclerosis (ICD10-I70.0).   05/07/2021 Cancer Staging   Staging form: Colon and Rectum, AJCC 8th Edition - Clinical stage from 05/07/2021: Stage Unknown (cTX, cN0, pM1) - Signed by Alla Feeling, NP on 05/13/2021 Total positive nodes: 0 Laterality: Right Sites of metastasis: Liver   05/07/2021 Initial Biopsy   Liver biopsy FINAL MICROSCOPIC DIAGNOSIS: A. LEFT LIVER LESION, NEEDLE CORE BIOPSY: - Adenocarcinoma.  ADDENDUM: Immunohistochemistry shows the adenocarcinoma is positive with cytokeratin 20 and CDX2 and is negative with cytokeratin 7, TTF-1 and Napsin A. The immunophenotype is consistent with metastatic colorectal adenocarcinoma. IHC Normal   05/07/2021 Miscellaneous   [facility]   Biomarker Findings -Microsatellite status - MS-Stable -Tumor Mutational Burden - 1 Muts/Mb  Genomic Findings -KRAS G12D -NRAS wildtype -APC R216 -ATM V455M -FAM123B R353   05/13/2021 Initial Diagnosis   Adenocarcinoma of colon metastatic to liver ([place])   06/06/2021 Genetic Testing   Negative genetic testing:  No pathogenic variants detected on the [facility] Multi-Cancer + RNA panel.  The report date is 06/06/2021.   The Multi-Cancer + RNA Panel offered by [facility] includes sequencing and/or deletion/duplication analysis of the following 84 genes:  AIP*, ALK, APC*, ATM*, AXIN2*, BAP1*, BARD1*, BLM*, BMPR1A*, BRCA1*, BRCA2*, BRIP1*, CASR, CDC73*, CDH1*, CDK4, CDKN1B*, CDKN1C*, CDKN2A, CEBPA, CHEK2*, CTNNA1*, DICER1*, DIS3L2*, EGFR, EPCAM, FH*, FLCN*, GATA2*, GPC3, GREM1, HOXB13, HRAS, KIT, MAX*, MEN1*, MET, MITF, MLH1*, MSH2*, MSH3*, MSH6*, MUTYH*, NBN*, NF1*, NF2*, NTHL1*, PALB2*, PDGFRA, PHOX2B, PMS2*, POLD1*, POLE*, POT1*, PRKAR1A*, PTCH1*, PTEN*, RAD50*, RAD51C*, RAD51D*, RB1*, RECQL4, RET, RUNX1*, SDHA*, SDHAF2*, SDHB*, SDHC*, SDHD*, SMAD4*, SMARCA4*, SMARCB1*, SMARCE1*, STK11*, SUFU*, TERC, TERT, TMEM127*, Tp53*, TSC1*, TSC2*, VHL*, WRN*, and WT1.  RNA analysis is performed for * genes.   07/17/2021 -  Chemotherapy      Patient is on Antibody Plan: COLORECTAL BEVACIZUMAB Q21D        INTERVAL HISTORY:  TRENDA CORLISS is here for a follow up of metastatic colon cancer. She was last seen by me on 07/17/21. She presents to the clinic accompanied by her daughter. She reports she had fatigue after her beva infusion. She also notes some nausea and felt generally unwell on one night (daughter says Saturday?). She reports her appetite is not as good. Her daughter notes she is not eating as much as she used to. She has Boost and another supplement to use, but she does not drink them frequently. Her daughter's son is in the [employer] and wishes to be transferred to a base closer to [place]. She asks if I can write a note about Louanna's terminal  illness to send for transferral recommendation.   All other systems were reviewed with the patient and are negative.  MEDICAL HISTORY:  Past Medical History:  Diagnosis Date   Arthritis    knees   Complication of anesthesia    problem 40 yrs ago Doctor told her anesthesia drugs caused a reaction, BP drops   Family history of breast cancer     Family history of ovarian cancer    Family history of pancreatic cancer    Hypertension     SURGICAL HISTORY: Past Surgical History:  Procedure Laterality Date   ABDOMINAL HYSTERECTOMY     CATARACT EXTRACTION W/PHACO Left 08/09/2018   Procedure: CATARACT EXTRACTION PHACO AND INTRAOCULAR LENS PLACEMENT ([place])  ISTENT AND ISTENT INJECT LEFT;  Surgeon: Leandrew Koyanagi, MD;  Location: [facility];  Service: Ophthalmology;  Laterality: Left;   CHOLECYSTECTOMY     EYE SURGERY     cataract with lens implant-right   INSERTION OF ANTERIOR SEGMENT AQUEOUS DRAINAGE DEVICE (ISTENT) Left 08/09/2018   Procedure: INSERTION OF ANTERIOR SEGMENT AQUEOUS DRAINAGE DEVICE (ISTENT);  Surgeon: Leandrew Koyanagi, MD;  Location: [facility];  Service: Ophthalmology;  Laterality: Left;   TOTAL KNEE ARTHROPLASTY Right 02/27/2014   Procedure: RIGHT TOTAL KNEE ARTHROPLASTY;  Surgeon: Tobi Bastos, MD;  Location: [facility];  Service: Orthopedics;  Laterality: Right;   TUBAL LIGATION      I have reviewed the social history and family history with the patient and they are unchanged from previous note.  ALLERGIES:  is allergic to cefdinir, codeine, ibandronic acid, meperidine hcl, adhesive [tape], sulfa antibiotics, and sulfonamide derivatives.  MEDICATIONS:  Current Outpatient Medications  Medication Sig Dispense Refill   acetaminophen (TYLENOL) 500 MG tablet Take 500 mg by mouth every 6 (six) hours as needed for moderate pain or headache.     albuterol (VENTOLIN HFA) 108 (90 Base) MCG/ACT inhaler Inhale 2 puffs into the lungs every 4 (four) hours as needed for wheezing or shortness of breath.     brimonidine (ALPHAGAN) 0.2 % ophthalmic solution Place 1 drop into the left eye 2 (two) times daily.     buPROPion (WELLBUTRIN XL) 150 MG 24 hr tablet Take 150 mg by mouth daily.      capecitabine (XELODA) 500 MG tablet Take 3 tablets in morning and 3 tablets in evening every 12 hours. Take for 7 days  on and 7 days off. Take within 30 minutes after meals. 84 tablet 0   capecitabine (XELODA) 500 MG tablet Take 2 tablets by mouth in morning and 3 tablets by mouth in evening 12 hours apart. Take for 7 days on and 7 days off. Take within 30 minutes after meals. 35 tablet 1   cholecalciferol (VITAMIN D3) 25 MCG (1000 UNIT) tablet Take 1,000 Units by mouth daily.     dorzolamide-timolol (COSOPT) 22.3-6.8 MG/ML ophthalmic solution Place 1 drop into both eyes 2 (two) times daily.     escitalopram (LEXAPRO) 5 MG tablet Take 5 mg by mouth daily.     famotidine (PEPCID) 20 MG tablet TAKE 1 TABLET BY MOUTH EVERY DAY 30 tablet 1   Fe Fum-FePoly-Vit C-Vit B3 (INTEGRA) 62.5-62.5-40-3 MG CAPS Take 1 capsule by mouth daily.     fexofenadine (ALLEGRA) 180 MG tablet Take 180 mg by mouth daily as needed for allergies or rhinitis.     fluticasone (FLONASE) 50 MCG/ACT nasal spray Place 1 spray into both nostrils daily as needed for allergies or rhinitis.     furosemide (  LASIX) 20 MG tablet Take one tablet (79m total) by mouth every other day, alternating with two tablets (459mtotal) by mouth every other day. (Patient taking differently: Take 20-40 mg by mouth See admin instructions. Take one tablet (2041motal) by mouth every other day, alternating with two tablets (71m69mtal) by mouth every other day.) 132 tablet 3   Guaifenesin (MUCINEX MAXIMUM STRENGTH) 1200 MG TB12 Take 1,200 mg by mouth 2 (two) times daily as needed (congestion).     KLOR-CON M20 20 MEQ tablet Take 10 mEq by mouth every Monday, Wednesday, and Friday.     latanoprost (XALATAN) 0.005 % ophthalmic solution Place 1 drop into both eyes at bedtime.     loperamide (IMODIUM A-D) 2 MG tablet Take 2-4 mg by mouth 4 (four) times daily as needed for diarrhea or loose stools.     Multiple Vitamins-Minerals (ADULT GUMMY PO) Take 2 capsules by mouth daily.     ondansetron (ZOFRAN) 8 MG tablet Take 1 tablet (8 mg total) by mouth every 8 (eight) hours as needed  for nausea or vomiting. 20 tablet 1   rosuvastatin (CRESTOR) 10 MG tablet Take 10 mg by mouth daily.     vitamin C (ASCORBIC ACID) 500 MG tablet Take 500 mg by mouth daily.     No current facility-administered medications for this visit.    PHYSICAL EXAMINATION: ECOG PERFORMANCE STATUS: 1 - Symptomatic but completely ambulatory  Vitals:   08/10/21 1425  BP: (!) 176/65  Pulse: 62  SpO2: 98%   Wt Readings from Last 3 Encounters:  08/10/21 179 lb 9.6 oz (81.5 kg)  07/17/21 181 lb 14.4 oz (82.5 kg)  06/15/21 180 lb 12.8 oz (82 kg)     GENERAL:alert, no distress and comfortable SKIN: skin color normal, no rashes or significant lesions EYES: normal, Conjunctiva are pink and non-injected, sclera clear  NEURO: alert & oriented x 3 with fluent speech  LABORATORY DATA:  I have reviewed the data as listed CBC Latest Ref Rng & Units 08/10/2021 07/17/2021 06/15/2021  WBC 4.0 - 10.5 K/uL 7.1 7.6 7.5  Hemoglobin 12.0 - 15.0 g/dL 11.4(L) 11.2(L) 10.9(L)  Hematocrit 36.0 - 46.0 % 35.1(L) 34.5(L) 34.1(L)  Platelets 150 - 400 K/uL 251 217 290     CMP Latest Ref Rng & Units 08/10/2021 07/17/2021 06/15/2021  Glucose 70 - 99 mg/dL 90 94 95  BUN 8 - 23 mg/dL _0 Creatinine 0.44 - 1.00 mg/dL 0.98 1.01(H) 0.89  Sodium 135 - 145 mmol/L 143 141 143  Potassium 3.5 - 5.1 mmol/L 4.4 4.5 4.5  Chloride 98 - 111 mmol/L 108 106 109  CO2 22 - 32 mmol/L _1 Calcium 8.9 - 10.3 mg/dL 9.8 9.2 9.5  Total Protein 6.5 - 8.1 g/dL 7.2 7.1 6.8  Total Bilirubin 0.3 - 1.2 mg/dL 0.7 0.7 0.4  Alkaline Phos 38 - 126 U/L 80 64 59  AST 15 - 41 U/L _2 ALT 0 - 44 U/L _3 RADIOGRAPHIC STUDIES: I have personally reviewed the radiological images as listed and agreed with the findings in the report. No results found.    No orders of the defined types were placed in this encounter.  All questions were answered. The patient knows to call the clinic with any problems, questions or concerns. No  barriers to learning was detected. The total time spent in the appointment was 30 minutes.     YanKrista Blue  Burr Medico, MD 08/10/2021   I, Wilburn Mylar, am acting as scribe for Truitt Merle, MD.   I have reviewed the above documentation for accuracy and completeness, and I agree with the above.

## 2021-08-11 ENCOUNTER — Encounter: Payer: Self-pay | Admitting: Hematology

## 2021-08-15 DIAGNOSIS — D649 Anemia, unspecified: Secondary | ICD-10-CM | POA: Diagnosis not present

## 2021-08-15 DIAGNOSIS — F339 Major depressive disorder, recurrent, unspecified: Secondary | ICD-10-CM | POA: Diagnosis not present

## 2021-08-15 DIAGNOSIS — I10 Essential (primary) hypertension: Secondary | ICD-10-CM | POA: Diagnosis not present

## 2021-08-15 DIAGNOSIS — C189 Malignant neoplasm of colon, unspecified: Secondary | ICD-10-CM | POA: Diagnosis not present

## 2021-08-15 DIAGNOSIS — M1712 Unilateral primary osteoarthritis, left knee: Secondary | ICD-10-CM | POA: Diagnosis not present

## 2021-08-15 DIAGNOSIS — D860 Sarcoidosis of lung: Secondary | ICD-10-CM | POA: Diagnosis not present

## 2021-08-16 ENCOUNTER — Other Ambulatory Visit: Payer: Self-pay | Admitting: Hematology

## 2021-08-19 DIAGNOSIS — M1712 Unilateral primary osteoarthritis, left knee: Secondary | ICD-10-CM | POA: Diagnosis not present

## 2021-08-19 DIAGNOSIS — D649 Anemia, unspecified: Secondary | ICD-10-CM | POA: Diagnosis not present

## 2021-08-19 DIAGNOSIS — I10 Essential (primary) hypertension: Secondary | ICD-10-CM | POA: Diagnosis not present

## 2021-08-19 DIAGNOSIS — F339 Major depressive disorder, recurrent, unspecified: Secondary | ICD-10-CM | POA: Diagnosis not present

## 2021-08-19 DIAGNOSIS — C189 Malignant neoplasm of colon, unspecified: Secondary | ICD-10-CM | POA: Diagnosis not present

## 2021-08-19 DIAGNOSIS — D860 Sarcoidosis of lung: Secondary | ICD-10-CM | POA: Diagnosis not present

## 2021-08-26 DIAGNOSIS — M1712 Unilateral primary osteoarthritis, left knee: Secondary | ICD-10-CM | POA: Diagnosis not present

## 2021-08-26 DIAGNOSIS — C189 Malignant neoplasm of colon, unspecified: Secondary | ICD-10-CM | POA: Diagnosis not present

## 2021-08-26 DIAGNOSIS — D649 Anemia, unspecified: Secondary | ICD-10-CM | POA: Diagnosis not present

## 2021-08-26 DIAGNOSIS — D860 Sarcoidosis of lung: Secondary | ICD-10-CM | POA: Diagnosis not present

## 2021-08-26 DIAGNOSIS — I10 Essential (primary) hypertension: Secondary | ICD-10-CM | POA: Diagnosis not present

## 2021-08-26 DIAGNOSIS — F339 Major depressive disorder, recurrent, unspecified: Secondary | ICD-10-CM | POA: Diagnosis not present

## 2021-08-31 ENCOUNTER — Other Ambulatory Visit: Payer: Self-pay

## 2021-08-31 ENCOUNTER — Inpatient Hospital Stay: Payer: Medicare Other | Attending: Nurse Practitioner

## 2021-08-31 ENCOUNTER — Inpatient Hospital Stay (HOSPITAL_BASED_OUTPATIENT_CLINIC_OR_DEPARTMENT_OTHER): Payer: Medicare Other | Admitting: Hematology

## 2021-08-31 ENCOUNTER — Inpatient Hospital Stay: Payer: Medicare Other

## 2021-08-31 ENCOUNTER — Encounter: Payer: Self-pay | Admitting: Hematology

## 2021-08-31 VITALS — BP 160/85 | HR 65 | Temp 97.7°F | Ht 65.0 in | Wt 181.2 lb

## 2021-08-31 DIAGNOSIS — C787 Secondary malignant neoplasm of liver and intrahepatic bile duct: Secondary | ICD-10-CM | POA: Diagnosis not present

## 2021-08-31 DIAGNOSIS — C189 Malignant neoplasm of colon, unspecified: Secondary | ICD-10-CM

## 2021-08-31 DIAGNOSIS — Z803 Family history of malignant neoplasm of breast: Secondary | ICD-10-CM | POA: Insufficient documentation

## 2021-08-31 DIAGNOSIS — I10 Essential (primary) hypertension: Secondary | ICD-10-CM | POA: Insufficient documentation

## 2021-08-31 DIAGNOSIS — C180 Malignant neoplasm of cecum: Secondary | ICD-10-CM | POA: Insufficient documentation

## 2021-08-31 DIAGNOSIS — Z809 Family history of malignant neoplasm, unspecified: Secondary | ICD-10-CM | POA: Insufficient documentation

## 2021-08-31 DIAGNOSIS — Z8041 Family history of malignant neoplasm of ovary: Secondary | ICD-10-CM | POA: Diagnosis not present

## 2021-08-31 DIAGNOSIS — Z5111 Encounter for antineoplastic chemotherapy: Secondary | ICD-10-CM | POA: Diagnosis not present

## 2021-08-31 LAB — CBC WITH DIFFERENTIAL (CANCER CENTER ONLY)
Abs Immature Granulocytes: 0.01 10*3/uL (ref 0.00–0.07)
Basophils Absolute: 0 10*3/uL (ref 0.0–0.1)
Basophils Relative: 0 %
Eosinophils Absolute: 0.1 10*3/uL (ref 0.0–0.5)
Eosinophils Relative: 2 %
HCT: 33.5 % — ABNORMAL LOW (ref 36.0–46.0)
Hemoglobin: 10.9 g/dL — ABNORMAL LOW (ref 12.0–15.0)
Immature Granulocytes: 0 %
Lymphocytes Relative: 33 %
Lymphs Abs: 2.2 10*3/uL (ref 0.7–4.0)
MCH: 32 pg (ref 26.0–34.0)
MCHC: 32.5 g/dL (ref 30.0–36.0)
MCV: 98.2 fL (ref 80.0–100.0)
Monocytes Absolute: 0.9 10*3/uL (ref 0.1–1.0)
Monocytes Relative: 13 %
Neutro Abs: 3.4 10*3/uL (ref 1.7–7.7)
Neutrophils Relative %: 52 %
Platelet Count: 186 10*3/uL (ref 150–400)
RBC: 3.41 MIL/uL — ABNORMAL LOW (ref 3.87–5.11)
RDW: 17.2 % — ABNORMAL HIGH (ref 11.5–15.5)
WBC Count: 6.7 10*3/uL (ref 4.0–10.5)
nRBC: 0 % (ref 0.0–0.2)

## 2021-08-31 LAB — CMP (CANCER CENTER ONLY)
ALT: 9 U/L (ref 0–44)
AST: 21 U/L (ref 15–41)
Albumin: 3.4 g/dL — ABNORMAL LOW (ref 3.5–5.0)
Alkaline Phosphatase: 67 U/L (ref 38–126)
Anion gap: 7 (ref 5–15)
BUN: 20 mg/dL (ref 8–23)
CO2: 25 mmol/L (ref 22–32)
Calcium: 9.1 mg/dL (ref 8.9–10.3)
Chloride: 110 mmol/L (ref 98–111)
Creatinine: 1 mg/dL (ref 0.44–1.00)
GFR, Estimated: 55 mL/min — ABNORMAL LOW (ref >60)
Glucose, Bld: 99 mg/dL (ref 70–99)
Potassium: 4.7 mmol/L (ref 3.5–5.1)
Sodium: 142 mmol/L (ref 135–145)
Total Bilirubin: 0.9 mg/dL (ref 0.3–1.2)
Total Protein: 6.8 g/dL (ref 6.5–8.1)

## 2021-08-31 LAB — CEA (IN HOUSE-CHCC): CEA (CHCC-In House): 12.03 ng/mL — ABNORMAL HIGH (ref 0.00–5.00)

## 2021-08-31 MED ORDER — AMLODIPINE BESYLATE 2.5 MG PO TABS: 2.5000 mg | ORAL_TABLET | Freq: Every day | ORAL | 1 refills | Status: DC

## 2021-08-31 MED ORDER — SODIUM CHLORIDE 0.9 % IV SOLN: Freq: Once | INTRAVENOUS | Status: AC

## 2021-08-31 MED ORDER — SODIUM CHLORIDE 0.9 % IV SOLN
7.5000 mg/kg | Freq: Once | INTRAVENOUS | Status: AC
  Administered 2021-08-31: 600 mg via INTRAVENOUS
  Filled 2021-08-31: qty 16

## 2021-08-31 MED ORDER — CAPECITABINE 500 MG PO TABS: ORAL_TABLET | ORAL | 1 refills | Status: AC

## 2021-08-31 NOTE — Patient Instructions (Signed)
[facility]  Discharge Instructions: Thank you for choosing [facility] to provide your oncology and hematology care.   If you have a lab appointment with the [facility], please go directly to the [facility] and check in at the registration area.   Wear comfortable clothing and clothing appropriate for easy access to any Portacath or PICC line.   We strive to give you quality time with your provider. You may need to reschedule your appointment if you arrive late (15 or more minutes).  Arriving late affects you and other patients whose appointments are after yours.  Also, if you miss three or more appointments without notifying the office, you may be dismissed from the clinic at the provider's discretion.      For prescription refill requests, have your pharmacy contact our office and allow 72 hours for refills to be completed.    Today you received the following chemotherapy and/or immunotherapy agents: MVASI   To help prevent nausea and vomiting after your treatment, we encourage you to take your nausea medication as directed.  BELOW ARE SYMPTOMS THAT SHOULD BE REPORTED IMMEDIATELY: *FEVER GREATER THAN 100.4 F (38 C) OR HIGHER *CHILLS OR SWEATING *NAUSEA AND VOMITING THAT IS NOT CONTROLLED WITH YOUR NAUSEA MEDICATION *UNUSUAL SHORTNESS OF BREATH *UNUSUAL BRUISING OR BLEEDING *URINARY PROBLEMS (pain or burning when urinating, or frequent urination) *BOWEL PROBLEMS (unusual diarrhea, constipation, pain near the anus) TENDERNESS IN MOUTH AND THROAT WITH OR WITHOUT PRESENCE OF ULCERS (sore throat, sores in mouth, or a toothache) UNUSUAL RASH, SWELLING OR PAIN  UNUSUAL VAGINAL DISCHARGE OR ITCHING   Items with * indicate a potential emergency and should be followed up as soon as possible or go to the Emergency Department if any problems should occur.  Please show the CHEMOTHERAPY ALERT CARD or IMMUNOTHERAPY ALERT CARD at check-in to the  Emergency Department and triage nurse.  Should you have questions after your visit or need to cancel or reschedule your appointment, please contact [facility]  Dept: [phone]  and follow the prompts.  Office hours are 8:00 a.m. to 4:30 p.m. Monday - Friday. Please note that voicemails left after 4:00 p.m. may not be returned until the following business day.  We are closed weekends and major holidays. You have access to a nurse at all times for urgent questions. Please call the main number to the clinic Dept: [phone] and follow the prompts.   For any non-urgent questions, you may also contact your provider using MyChart. We now offer e-Visits for anyone 35 and older to request care online for non-urgent symptoms. For details visit [email or web address].   Also download the MyChart app! Go to the app store, search "MyChart", open the app, select [facility], and log in with your MyChart username and password.  Due to Covid, a mask is required upon entering the hospital/clinic. If you do not have a mask, one will be given to you upon arrival. For doctor visits, patients may have 1 support person aged 29 or older with them. For treatment visits, patients cannot have anyone with them due to current Covid guidelines and our immunocompromised population.

## 2021-08-31 NOTE — Progress Notes (Signed)
Jaime [name]   [phone] [phone]   Clinic Follow up Note   Patient Care Team: Crist Infante, MD as PCP - General (Internal Medicine) Alla Feeling, NP as Nurse Practitioner (Nurse Practitioner) Truitt Merle, MD as Consulting Physician (Hematology) Otis Brace, MD as Referring Physician (Gastroenterology) Laverda Sorenson I, NP as Nurse Practitioner [facility])  Date of Service:  08/31/2021  CHIEF COMPLAINT: f/u of metastatic colon cancer  CURRENT THERAPY:  First-line Xeloda 1000 mg AM/1500 mg PM for 1 week on/1 week off, started on 05/13/2021. Dose decreased to 1040m q12h, 1 week on and one week off from 08/31/2021 -Bevacizumab every 3 weeks added 07/17/21  ASSESSMENT & PLAN:  WRYLEY TEATERis a 87y.o. female with   1. Adenocarcinoma of cecum with liver metastasis, stage IV, MMR normal, KRAD G12D (+)  -she presented with IDA, found to have cecum mass on imaging, colonoscopy was declined. After 3 months, she was found to have enlarging cecal mass and new liver masses. Liver biopsy confirmed metastatic colon adenocarcinoma, IHC was normal. -FO genomic sequencing showed MSI stable disease, low tumor burden, K-ras mutation positive, no other targetable mutations.  She is not a candidate for immunotherapy or EGFR inhibitor. -We agreed to proceed with single agent Xeloda, which she started 05/13/21. We attempted increase with C2 but she developed diarrhea. She now continues on 1000 mg in the morning and 1500 mg in the evening, 7 days on and 7 days off. -She started bevacizumab on 07/17/21. She is tolerating relatively well with mild fatigue. -Labs reviewed, adequate to proceed with third cycle beva -due to her recent chest pain and diarrhea, will decrease xeloda to [id number] day 1-7 every 14 days  -Her tumor marker CEA has decreased since she started chemo, indicating good response to treatment -Plan to repeat restaging scan  before next visit  2. Symptom Management: High BP, Low appetite, weight loss -secondary to #1 and/or treatment -her weight is overall stable lately  -her BP has been high on treatment. I prescribed amlodipine today.     PLAN: -proceed to C3 bevacizumab today -I called in amlodipine today for her hypertension  -continue Xeloda , decrease to 1000 mg every 12h, 7 days on and 7 days off. I refilled today. -labs, f/u and beva in 3 weeks   -they prefer Mondays -repeat CT CAP w contrast before next visit    No problem-specific Assessment & Plan notes found for this encounter.   SUMMARY OF ONCOLOGIC HISTORY: Oncology History  [facility]   Patient well until + covid-19 in 06/2020 with DVT/PE at that time. Hospitalized for anticoagulation, discharged to SNF on eliquis. [facility]. Subsequent pneumonia 10/2020 and RUL nodule new from 2017 warranting surveillance. Ongoing weight loss and worsening anemia 12/2020 Hg 7.7 heme + stool s/p RBC and IV Feraheme. CT 01/2021 showed mass in the cecum near ileocecal valve. Pt declined colonoscopy. Repeat CT 04/2021 showed enlarging colon mass and new liver masses. Liver bx confirmed metastatic colon adeno carcinoma.    11/20/2020 Imaging   CT chest with contrast -COVID-19 pneumonia follow-up  IMPRESSION: No findings suspicious for pneumonia. Lingular and right lower lobe opacities favor atelectasis.   Moderate right pleural effusion, increased. Trace left pleural effusion, new. No frank interstitial edema.   6 mm central right upper lobe nodule, unchanged from recent CT, but new from 2017. Follow-up CT chest is suggested in 6 months.  02/12/2021 Imaging   CT AP with contrast IMPRESSION: 1. Abnormal, focal wall thickening in the medial cecum, proximal to the ileocecal valve. Imaging features highly suspicious for neoplasm in light of the reported history of blood in stool. 2. No evidence  for metastatic disease in the abdomen or pelvis. No ileocolic lymphadenopathy. 3. 8 mm common bile duct diameter in the head of the pancreas, upper normal for patient age. 4. Stable right pleural effusion with collapse/consolidation in the right lower lobe comparing to CT chest 11/20/2020. 5. Left colonic diverticulosis without diverticulitis. 6. Aortic Atherosclerosis (ICD10-I70.0).   04/30/2021 Imaging   CT AP with contrast IMPRESSION: 1. Progressive infiltrative mass involving the cecum compared with previous CT 3 months ago, consistent with progressive colon cancer. 2. Interval development of several hypoattenuating liver lesions consistent with metastatic disease. No abdominal adenopathy or signs of peritoneal carcinomatosis. 3. Progressive intra and extrahepatic biliary dilatation of undetermined etiology. Given change from previous recent CT, correlation with liver function studies recommended. 4. Stable right pleural effusion and right basilar pulmonary opacity from recent prior studies. Of note, a right upper lobe pulmonary nodule was demonstrated on chest CT 11/20/2020 for which chest CT follow-up is recommended. That is potentially a metastasis. 5.  Aortic Atherosclerosis (ICD10-I70.0).   05/07/2021 Cancer Staging   Staging form: Colon and Rectum, AJCC 8th Edition - Clinical stage from 05/07/2021: Stage Unknown (cTX, cN0, pM1) - Signed by Alla Feeling, NP on 05/13/2021 Total positive nodes: 0 Laterality: Right Sites of metastasis: Liver   05/07/2021 Initial Biopsy   Liver biopsy FINAL MICROSCOPIC DIAGNOSIS: A. LEFT LIVER LESION, NEEDLE CORE BIOPSY: - Adenocarcinoma.  ADDENDUM: Immunohistochemistry shows the adenocarcinoma is positive with cytokeratin 20 and CDX2 and is negative with cytokeratin 7, TTF-1 and Napsin A. The immunophenotype is consistent with metastatic colorectal adenocarcinoma. IHC Normal   05/07/2021 Miscellaneous   [facility]   Biomarker  Findings -Microsatellite status - MS-Stable -Tumor Mutational Burden - 1 Muts/Mb  Genomic Findings -KRAS G12D -NRAS wildtype -APC R216 -ATM V455M -[id number] R353   05/13/2021 Initial Diagnosis   Adenocarcinoma of colon metastatic to liver ([place])   06/06/2021 Genetic Testing   Negative genetic testing:  No pathogenic variants detected on the [facility] Multi-Cancer + RNA panel. The report date is 06/06/2021.   The Multi-Cancer + RNA Panel offered by [facility] includes sequencing and/or deletion/duplication analysis of the following 84 genes:  AIP*, ALK, APC*, ATM*, AXIN2*, BAP1*, BARD1*, BLM*, BMPR1A*, BRCA1*, BRCA2*, BRIP1*, CASR, CDC73*, CDH1*, CDK4, CDKN1B*, CDKN1C*, CDKN2A, CEBPA, CHEK2*, CTNNA1*, DICER1*, DIS3L2*, EGFR, EPCAM, FH*, FLCN*, GATA2*, GPC3, GREM1, HOXB13, HRAS, KIT, MAX*, MEN1*, MET, MITF, MLH1*, MSH2*, MSH3*, MSH6*, MUTYH*, NBN*, NF1*, NF2*, NTHL1*, PALB2*, PDGFRA, PHOX2B, PMS2*, POLD1*, POLE*, POT1*, PRKAR1A*, PTCH1*, PTEN*, RAD50*, RAD51C*, RAD51D*, RB1*, RECQL4, RET, RUNX1*, SDHA*, SDHAF2*, SDHB*, SDHC*, SDHD*, SMAD4*, SMARCA4*, SMARCB1*, SMARCE1*, STK11*, SUFU*, TERC, TERT, TMEM127*, Tp53*, TSC1*, TSC2*, VHL*, WRN*, and WT1.  RNA analysis is performed for * genes.   07/17/2021 -  Chemotherapy   Patient is on Treatment Plan : COLORECTAL Bevacizumab q21d        INTERVAL HISTORY:  BLANNIE SHEDLOCK is here for a follow up of metastatic colon cancer. She was last seen by me on 08/10/21. She presents to the clinic accompanied by her daughter. She reports she had "terrible pain" to the left side of her chest. She denies having done anything prior to the pain and any radiation of the pain. She notes it lasted for about 2 hours and rates it  a 10/10. She notes she took tylenol. She also notes it has not happened again. Her daughter notes she had a bad fall several weeks ago and likely broke a rib on the left side. She also reports urinary urgency and doesn't always make it to the bathroom. She  also reports cramping with bowel movements. Her daughter notes they are starting to look into assisted living. Dannia also reports her blood pressure has been elevated.   All other systems were reviewed with the patient and are negative.  MEDICAL HISTORY:  Past Medical History:  Diagnosis Date   Arthritis    knees   Complication of anesthesia    problem 40 yrs ago Doctor told her anesthesia drugs caused a reaction, BP drops   Family history of breast cancer    Family history of ovarian cancer    Family history of pancreatic cancer    Hypertension     SURGICAL HISTORY: Past Surgical History:  Procedure Laterality Date   ABDOMINAL HYSTERECTOMY     CATARACT EXTRACTION W/PHACO Left 08/09/2018   Procedure: CATARACT EXTRACTION PHACO AND INTRAOCULAR LENS PLACEMENT ([place])  ISTENT AND ISTENT INJECT LEFT;  Surgeon: Leandrew Koyanagi, MD;  Location: [facility];  Service: Ophthalmology;  Laterality: Left;   CHOLECYSTECTOMY     EYE SURGERY     cataract with lens implant-right   INSERTION OF ANTERIOR SEGMENT AQUEOUS DRAINAGE DEVICE (ISTENT) Left 08/09/2018   Procedure: INSERTION OF ANTERIOR SEGMENT AQUEOUS DRAINAGE DEVICE (ISTENT);  Surgeon: Leandrew Koyanagi, MD;  Location: [place];  Service: Ophthalmology;  Laterality: Left;   TOTAL KNEE ARTHROPLASTY Right 02/27/2014   Procedure: RIGHT TOTAL KNEE ARTHROPLASTY;  Surgeon: Tobi Bastos, MD;  Location: [facility];  Service: Orthopedics;  Laterality: Right;   TUBAL LIGATION      I have reviewed the social history and family history with the patient and they are unchanged from previous note.  ALLERGIES:  is allergic to cefdinir, codeine, ibandronic acid, meperidine hcl, adhesive [tape], sulfa antibiotics, and sulfonamide derivatives.  MEDICATIONS:  Current Outpatient Medications  Medication Sig Dispense Refill   amLODipine (NORVASC) 2.5 MG tablet Take 1 tablet (2.5 mg total) by mouth daily. 30 tablet 1   acetaminophen  (TYLENOL) 500 MG tablet Take 500 mg by mouth every 6 (six) hours as needed for moderate pain or headache.     albuterol (VENTOLIN HFA) 108 (90 Base) MCG/ACT inhaler Inhale 2 puffs into the lungs every 4 (four) hours as needed for wheezing or shortness of breath.     brimonidine (ALPHAGAN) 0.2 % ophthalmic solution Place 1 drop into the left eye 2 (two) times daily.     buPROPion (WELLBUTRIN XL) 150 MG 24 hr tablet Take 150 mg by mouth daily.      capecitabine (XELODA) 500 MG tablet Take 3 tablets in morning and 3 tablets in evening every 12 hours. Take for 7 days on and 7 days off. Take within 30 minutes after meals. 84 tablet 0   capecitabine (XELODA) 500 MG tablet Take 2 tablets by mouth every 12 hours. Take for 7 days on and 7 days off. Take within 30 minutes after meals. 56 tablet 1   cholecalciferol (VITAMIN D3) 25 MCG (1000 UNIT) tablet Take 1,000 Units by mouth daily.     dorzolamide-timolol (COSOPT) 22.3-6.8 MG/ML ophthalmic solution Place 1 drop into both eyes 2 (two) times daily.     escitalopram (LEXAPRO) 5 MG tablet Take 5 mg by mouth daily.     famotidine (  PEPCID) 20 MG tablet TAKE 1 TABLET BY MOUTH EVERY DAY 30 tablet 1   Fe Fum-FePoly-Vit C-Vit B3 (INTEGRA) 62.5-62.5-40-3 MG CAPS Take 1 capsule by mouth daily.     fexofenadine (ALLEGRA) 180 MG tablet Take 180 mg by mouth daily as needed for allergies or rhinitis.     fluticasone (FLONASE) 50 MCG/ACT nasal spray Place 1 spray into both nostrils daily as needed for allergies or rhinitis.     furosemide (LASIX) 20 MG tablet Take one tablet (29m total) by mouth every other day, alternating with two tablets (421mtotal) by mouth every other day. (Patient taking differently: Take 20-40 mg by mouth See admin instructions. Take one tablet (2067motal) by mouth every other day, alternating with two tablets (59m79mtal) by mouth every other day.) 132 tablet 3   Guaifenesin (MUCINEX MAXIMUM STRENGTH) 1200 MG TB12 Take 1,200 mg by mouth 2 (two)  times daily as needed (congestion).     KLOR-CON M20 20 MEQ tablet Take 10 mEq by mouth every Monday, Wednesday, and Friday.     latanoprost (XALATAN) 0.005 % ophthalmic solution Place 1 drop into both eyes at bedtime.     loperamide (IMODIUM A-D) 2 MG tablet Take 2-4 mg by mouth 4 (four) times daily as needed for diarrhea or loose stools.     Multiple Vitamins-Minerals (ADULT GUMMY PO) Take 2 capsules by mouth daily.     ondansetron (ZOFRAN) 8 MG tablet Take 1 tablet (8 mg total) by mouth every 8 (eight) hours as needed for nausea or vomiting. 20 tablet 1   rosuvastatin (CRESTOR) 10 MG tablet Take 10 mg by mouth daily.     vitamin C (ASCORBIC ACID) 500 MG tablet Take 500 mg by mouth daily.     No current facility-administered medications for this visit.    PHYSICAL EXAMINATION: ECOG PERFORMANCE STATUS: 3 - Symptomatic, >50% confined to bed  Vitals:   08/31/21 1343  BP: (!) 160/85  Pulse: 65  Temp: 97.7 F (36.5 C)  SpO2: 100%   Wt Readings from Last 3 Encounters:  08/31/21 181 lb 3.2 oz (82.2 kg)  08/10/21 179 lb 9.6 oz (81.5 kg)  07/17/21 181 lb 14.4 oz (82.5 kg)     GENERAL:alert, no distress and comfortable SKIN: skin color normal, no rashes or significant lesions EYES: normal, Conjunctiva are pink and non-injected, sclera clear  NEURO: alert & oriented x 3 with fluent speech  LABORATORY DATA:  I have reviewed the data as listed CBC Latest Ref Rng & Units 08/31/2021 08/10/2021 07/17/2021  WBC 4.0 - 10.5 K/uL 6.7 7.1 7.6  Hemoglobin 12.0 - 15.0 g/dL 10.9(L) 11.4(L) 11.2(L)  Hematocrit 36.0 - 46.0 % 33.5(L) 35.1(L) 34.5(L)  Platelets 150 - 400 K/uL 186 251 217     CMP Latest Ref Rng & Units 08/31/2021 08/10/2021 07/17/2021  Glucose 70 - 99 mg/dL 99 90 94  BUN 8 - 23 mg/dL 20 19 21   Creatinine 0.44 - 1.00 mg/dL 1.00 0.98 1.01(H)  Sodium 135 - 145 mmol/L 142 143 141  Potassium 3.5 - 5.1 mmol/L 4.7 4.4 4.5  Chloride 98 - 111 mmol/L 110 108 106  CO2 22 - 32 mmol/L 25 27  27   Calcium 8.9 - 10.3 mg/dL 9.1 9.8 9.2  Total Protein 6.5 - 8.1 g/dL 6.8 7.2 7.1  Total Bilirubin 0.3 - 1.2 mg/dL 0.9 0.7 0.7  Alkaline Phos 38 - 126 U/L 67 80 64  AST 15 - 41 U/L 21 19 20   ALT 0 -  44 U/L 9 9 8       RADIOGRAPHIC STUDIES: I have personally reviewed the radiological images as listed and agreed with the findings in the report. No results found.    Orders Placed This Encounter  Procedures   CT CHEST ABDOMEN PELVIS W CONTRAST    Standing Status:   Future    Standing Expiration Date:   08/31/2022    Order Specific Question:   Preferred imaging location?    Answer:   [facility]    Order Specific Question:   Is Oral Contrast requested for this exam?    Answer:   No oral contrast    Order Specific Question:   Reason for No Oral Contrast    Answer:   Other    Order Specific Question:   Please answer why no oral contrast is requested    Answer:   diarrhea    All questions were answered. The patient knows to call the clinic with any problems, questions or concerns. No barriers to learning was detected. The total time spent in the appointment was 30 minutes.     Truitt Merle, MD 08/31/2021   I, Wilburn Mylar, am acting as scribe for Truitt Merle, MD.   I have reviewed the above documentation for accuracy and completeness, and I agree with the above.

## 2021-09-02 DIAGNOSIS — M1712 Unilateral primary osteoarthritis, left knee: Secondary | ICD-10-CM | POA: Diagnosis not present

## 2021-09-02 DIAGNOSIS — F339 Major depressive disorder, recurrent, unspecified: Secondary | ICD-10-CM | POA: Diagnosis not present

## 2021-09-02 DIAGNOSIS — D860 Sarcoidosis of lung: Secondary | ICD-10-CM | POA: Diagnosis not present

## 2021-09-02 DIAGNOSIS — C189 Malignant neoplasm of colon, unspecified: Secondary | ICD-10-CM | POA: Diagnosis not present

## 2021-09-02 DIAGNOSIS — I10 Essential (primary) hypertension: Secondary | ICD-10-CM | POA: Diagnosis not present

## 2021-09-02 DIAGNOSIS — D649 Anemia, unspecified: Secondary | ICD-10-CM | POA: Diagnosis not present

## 2021-09-03 DIAGNOSIS — C189 Malignant neoplasm of colon, unspecified: Secondary | ICD-10-CM | POA: Diagnosis not present

## 2021-09-03 DIAGNOSIS — R030 Elevated blood-pressure reading, without diagnosis of hypertension: Secondary | ICD-10-CM | POA: Diagnosis not present

## 2021-09-03 DIAGNOSIS — M8580 Other specified disorders of bone density and structure, unspecified site: Secondary | ICD-10-CM | POA: Diagnosis not present

## 2021-09-03 DIAGNOSIS — F329 Major depressive disorder, single episode, unspecified: Secondary | ICD-10-CM | POA: Diagnosis not present

## 2021-09-03 DIAGNOSIS — D860 Sarcoidosis of lung: Secondary | ICD-10-CM | POA: Diagnosis not present

## 2021-09-03 DIAGNOSIS — R269 Unspecified abnormalities of gait and mobility: Secondary | ICD-10-CM | POA: Diagnosis not present

## 2021-09-03 DIAGNOSIS — Z029 Encounter for administrative examinations, unspecified: Secondary | ICD-10-CM | POA: Diagnosis not present

## 2021-09-03 DIAGNOSIS — E785 Hyperlipidemia, unspecified: Secondary | ICD-10-CM | POA: Diagnosis not present

## 2021-09-03 DIAGNOSIS — I872 Venous insufficiency (chronic) (peripheral): Secondary | ICD-10-CM | POA: Diagnosis not present

## 2021-09-03 DIAGNOSIS — M179 Osteoarthritis of knee, unspecified: Secondary | ICD-10-CM | POA: Diagnosis not present

## 2021-09-03 DIAGNOSIS — I10 Essential (primary) hypertension: Secondary | ICD-10-CM | POA: Diagnosis not present

## 2021-09-07 DIAGNOSIS — D860 Sarcoidosis of lung: Secondary | ICD-10-CM | POA: Diagnosis not present

## 2021-09-07 DIAGNOSIS — I10 Essential (primary) hypertension: Secondary | ICD-10-CM | POA: Diagnosis not present

## 2021-09-07 DIAGNOSIS — Z8616 Personal history of COVID-19: Secondary | ICD-10-CM | POA: Diagnosis not present

## 2021-09-07 DIAGNOSIS — H409 Unspecified glaucoma: Secondary | ICD-10-CM | POA: Diagnosis not present

## 2021-09-07 DIAGNOSIS — D649 Anemia, unspecified: Secondary | ICD-10-CM | POA: Diagnosis not present

## 2021-09-07 DIAGNOSIS — Z6830 Body mass index (BMI) 30.0-30.9, adult: Secondary | ICD-10-CM | POA: Diagnosis not present

## 2021-09-07 DIAGNOSIS — C189 Malignant neoplasm of colon, unspecified: Secondary | ICD-10-CM | POA: Diagnosis not present

## 2021-09-07 DIAGNOSIS — E669 Obesity, unspecified: Secondary | ICD-10-CM | POA: Diagnosis not present

## 2021-09-07 DIAGNOSIS — I872 Venous insufficiency (chronic) (peripheral): Secondary | ICD-10-CM | POA: Diagnosis not present

## 2021-09-07 DIAGNOSIS — Z9181 History of falling: Secondary | ICD-10-CM | POA: Diagnosis not present

## 2021-09-07 DIAGNOSIS — Z85828 Personal history of other malignant neoplasm of skin: Secondary | ICD-10-CM | POA: Diagnosis not present

## 2021-09-07 DIAGNOSIS — K589 Irritable bowel syndrome without diarrhea: Secondary | ICD-10-CM | POA: Diagnosis not present

## 2021-09-07 DIAGNOSIS — M1712 Unilateral primary osteoarthritis, left knee: Secondary | ICD-10-CM | POA: Diagnosis not present

## 2021-09-07 DIAGNOSIS — J302 Other seasonal allergic rhinitis: Secondary | ICD-10-CM | POA: Diagnosis not present

## 2021-09-07 DIAGNOSIS — Z86711 Personal history of pulmonary embolism: Secondary | ICD-10-CM | POA: Diagnosis not present

## 2021-09-07 DIAGNOSIS — Z79899 Other long term (current) drug therapy: Secondary | ICD-10-CM | POA: Diagnosis not present

## 2021-09-07 DIAGNOSIS — E785 Hyperlipidemia, unspecified: Secondary | ICD-10-CM | POA: Diagnosis not present

## 2021-09-07 DIAGNOSIS — Z96651 Presence of right artificial knee joint: Secondary | ICD-10-CM | POA: Diagnosis not present

## 2021-09-07 DIAGNOSIS — F339 Major depressive disorder, recurrent, unspecified: Secondary | ICD-10-CM | POA: Diagnosis not present

## 2021-09-07 DIAGNOSIS — K5790 Diverticulosis of intestine, part unspecified, without perforation or abscess without bleeding: Secondary | ICD-10-CM | POA: Diagnosis not present

## 2021-09-07 DIAGNOSIS — M8580 Other specified disorders of bone density and structure, unspecified site: Secondary | ICD-10-CM | POA: Diagnosis not present

## 2021-09-10 ENCOUNTER — Ambulatory Visit (HOSPITAL_COMMUNITY): Payer: Medicare Other

## 2021-09-10 DIAGNOSIS — I10 Essential (primary) hypertension: Secondary | ICD-10-CM | POA: Diagnosis not present

## 2021-09-10 DIAGNOSIS — D860 Sarcoidosis of lung: Secondary | ICD-10-CM | POA: Diagnosis not present

## 2021-09-10 DIAGNOSIS — C189 Malignant neoplasm of colon, unspecified: Secondary | ICD-10-CM | POA: Diagnosis not present

## 2021-09-10 DIAGNOSIS — Z111 Encounter for screening for respiratory tuberculosis: Secondary | ICD-10-CM | POA: Diagnosis not present

## 2021-09-10 DIAGNOSIS — M1712 Unilateral primary osteoarthritis, left knee: Secondary | ICD-10-CM | POA: Diagnosis not present

## 2021-09-10 DIAGNOSIS — D649 Anemia, unspecified: Secondary | ICD-10-CM | POA: Diagnosis not present

## 2021-09-10 DIAGNOSIS — F339 Major depressive disorder, recurrent, unspecified: Secondary | ICD-10-CM | POA: Diagnosis not present

## 2021-09-14 ENCOUNTER — Telehealth: Payer: Self-pay

## 2021-09-14 DIAGNOSIS — D649 Anemia, unspecified: Secondary | ICD-10-CM | POA: Diagnosis not present

## 2021-09-14 DIAGNOSIS — D860 Sarcoidosis of lung: Secondary | ICD-10-CM | POA: Diagnosis not present

## 2021-09-14 DIAGNOSIS — M1712 Unilateral primary osteoarthritis, left knee: Secondary | ICD-10-CM | POA: Diagnosis not present

## 2021-09-14 DIAGNOSIS — F339 Major depressive disorder, recurrent, unspecified: Secondary | ICD-10-CM | POA: Diagnosis not present

## 2021-09-14 DIAGNOSIS — I10 Essential (primary) hypertension: Secondary | ICD-10-CM | POA: Diagnosis not present

## 2021-09-14 DIAGNOSIS — C189 Malignant neoplasm of colon, unspecified: Secondary | ICD-10-CM | POA: Diagnosis not present

## 2021-09-14 NOTE — Telephone Encounter (Signed)
Telephone note made regarding CT Scan

## 2021-09-17 ENCOUNTER — Ambulatory Visit (HOSPITAL_COMMUNITY)
Admission: RE | Admit: 2021-09-17 | Discharge: 2021-09-17 | Disposition: A | Discharge status: Home / Self Care | Payer: Medicare Other | Source: Ambulatory Visit | Attending: Hematology | Admitting: Hematology

## 2021-09-17 ENCOUNTER — Other Ambulatory Visit: Payer: Self-pay

## 2021-09-17 DIAGNOSIS — I700 Atherosclerosis of aorta: Secondary | ICD-10-CM | POA: Diagnosis not present

## 2021-09-17 DIAGNOSIS — C787 Secondary malignant neoplasm of liver and intrahepatic bile duct: Secondary | ICD-10-CM | POA: Diagnosis not present

## 2021-09-17 DIAGNOSIS — J984 Other disorders of lung: Secondary | ICD-10-CM | POA: Diagnosis not present

## 2021-09-17 DIAGNOSIS — C189 Malignant neoplasm of colon, unspecified: Secondary | ICD-10-CM | POA: Insufficient documentation

## 2021-09-17 DIAGNOSIS — R918 Other nonspecific abnormal finding of lung field: Secondary | ICD-10-CM | POA: Diagnosis not present

## 2021-09-17 DIAGNOSIS — K6389 Other specified diseases of intestine: Secondary | ICD-10-CM | POA: Diagnosis not present

## 2021-09-17 DIAGNOSIS — K5730 Diverticulosis of large intestine without perforation or abscess without bleeding: Secondary | ICD-10-CM | POA: Diagnosis not present

## 2021-09-17 DIAGNOSIS — K769 Liver disease, unspecified: Secondary | ICD-10-CM | POA: Diagnosis not present

## 2021-09-17 MED ORDER — IOHEXOL 350 MG/ML SOLN
75.0000 mL | Freq: Once | INTRAVENOUS | Status: AC | PRN
  Administered 2021-09-17: 75 mL via INTRAVENOUS

## 2021-09-19 ENCOUNTER — Other Ambulatory Visit: Payer: Self-pay | Admitting: Hematology

## 2021-09-22 ENCOUNTER — Inpatient Hospital Stay: Payer: Medicare Other | Admitting: Hematology

## 2021-09-22 ENCOUNTER — Inpatient Hospital Stay: Payer: Medicare Other

## 2021-09-22 DIAGNOSIS — C189 Malignant neoplasm of colon, unspecified: Secondary | ICD-10-CM

## 2021-09-23 ENCOUNTER — Inpatient Hospital Stay (HOSPITAL_BASED_OUTPATIENT_CLINIC_OR_DEPARTMENT_OTHER): Payer: Medicare Other | Admitting: Hematology

## 2021-09-23 ENCOUNTER — Other Ambulatory Visit: Payer: Self-pay

## 2021-09-23 ENCOUNTER — Inpatient Hospital Stay: Payer: Medicare Other | Attending: Nurse Practitioner

## 2021-09-23 ENCOUNTER — Other Ambulatory Visit: Payer: Self-pay | Admitting: Hematology

## 2021-09-23 ENCOUNTER — Inpatient Hospital Stay: Payer: Medicare Other

## 2021-09-23 VITALS — BP 184/72 | HR 67 | Temp 97.6°F | Resp 16 | Ht 65.0 in | Wt 182.2 lb

## 2021-09-23 DIAGNOSIS — Z8041 Family history of malignant neoplasm of ovary: Secondary | ICD-10-CM | POA: Insufficient documentation

## 2021-09-23 DIAGNOSIS — Z803 Family history of malignant neoplasm of breast: Secondary | ICD-10-CM | POA: Insufficient documentation

## 2021-09-23 DIAGNOSIS — Z90710 Acquired absence of both cervix and uterus: Secondary | ICD-10-CM | POA: Diagnosis not present

## 2021-09-23 DIAGNOSIS — Z808 Family history of malignant neoplasm of other organs or systems: Secondary | ICD-10-CM | POA: Insufficient documentation

## 2021-09-23 DIAGNOSIS — C787 Secondary malignant neoplasm of liver and intrahepatic bile duct: Secondary | ICD-10-CM

## 2021-09-23 DIAGNOSIS — C189 Malignant neoplasm of colon, unspecified: Secondary | ICD-10-CM | POA: Diagnosis not present

## 2021-09-23 DIAGNOSIS — C180 Malignant neoplasm of cecum: Secondary | ICD-10-CM | POA: Diagnosis not present

## 2021-09-23 DIAGNOSIS — I10 Essential (primary) hypertension: Secondary | ICD-10-CM | POA: Diagnosis not present

## 2021-09-23 LAB — CBC WITH DIFFERENTIAL (CANCER CENTER ONLY)
Abs Immature Granulocytes: 0.02 10*3/uL (ref 0.00–0.07)
Basophils Absolute: 0 10*3/uL (ref 0.0–0.1)
Basophils Relative: 0 %
Eosinophils Absolute: 0.1 10*3/uL (ref 0.0–0.5)
Eosinophils Relative: 2 %
HCT: 34.7 % — ABNORMAL LOW (ref 36.0–46.0)
Hemoglobin: 11.3 g/dL — ABNORMAL LOW (ref 12.0–15.0)
Immature Granulocytes: 0 %
Lymphocytes Relative: 31 %
Lymphs Abs: 2.2 10*3/uL (ref 0.7–4.0)
MCH: 32.2 pg (ref 26.0–34.0)
MCHC: 32.6 g/dL (ref 30.0–36.0)
MCV: 98.9 fL (ref 80.0–100.0)
Monocytes Absolute: 1.3 10*3/uL — ABNORMAL HIGH (ref 0.1–1.0)
Monocytes Relative: 18 %
Neutro Abs: 3.5 10*3/uL (ref 1.7–7.7)
Neutrophils Relative %: 49 %
Platelet Count: 195 10*3/uL (ref 150–400)
RBC: 3.51 MIL/uL — ABNORMAL LOW (ref 3.87–5.11)
RDW: 17 % — ABNORMAL HIGH (ref 11.5–15.5)
WBC Count: 7.1 10*3/uL (ref 4.0–10.5)
nRBC: 0 % (ref 0.0–0.2)

## 2021-09-23 LAB — FERRITIN: Ferritin: 49 ng/mL (ref 11–307)

## 2021-09-23 LAB — CMP (CANCER CENTER ONLY)
ALT: 12 U/L (ref 0–44)
AST: 22 U/L (ref 15–41)
Albumin: 3.5 g/dL (ref 3.5–5.0)
Alkaline Phosphatase: 76 U/L (ref 38–126)
Anion gap: 9 (ref 5–15)
BUN: 23 mg/dL (ref 8–23)
CO2: 22 mmol/L (ref 22–32)
Calcium: 8.8 mg/dL — ABNORMAL LOW (ref 8.9–10.3)
Chloride: 109 mmol/L (ref 98–111)
Creatinine: 1.08 mg/dL — ABNORMAL HIGH (ref 0.44–1.00)
GFR, Estimated: 50 mL/min — ABNORMAL LOW (ref >60)
Glucose, Bld: 96 mg/dL (ref 70–99)
Potassium: 4.5 mmol/L (ref 3.5–5.1)
Sodium: 140 mmol/L (ref 135–145)
Total Bilirubin: 0.6 mg/dL (ref 0.3–1.2)
Total Protein: 7.1 g/dL (ref 6.5–8.1)

## 2021-09-23 LAB — IRON AND TIBC
Iron: 95 ug/dL (ref 41–142)
Saturation Ratios: 22 % (ref 21–57)
TIBC: 435 ug/dL (ref 236–444)
UIBC: 340 ug/dL (ref 120–384)

## 2021-09-23 LAB — CEA (IN HOUSE-CHCC): CEA (CHCC-In House): 14.23 ng/mL — ABNORMAL HIGH (ref 0.00–5.00)

## 2021-09-23 NOTE — Progress Notes (Signed)
Jaime [name]   [phone] Fax:([phone]   Clinic Follow up Note   Patient Care Team: Crist Infante, MD as PCP - General (Internal Medicine) Alla Feeling, NP as Nurse Practitioner (Nurse Practitioner) Truitt Merle, MD as Consulting Physician (Hematology) Otis Brace, MD as Referring Physician (Gastroenterology) Teodoro Spray, NP as Nurse Practitioner [facility] and Palliative Medicine)  Date of Service:  09/23/2021  CHIEF COMPLAINT: f/u of metastatic colon cancer  CURRENT THERAPY:  Supportive Care  ASSESSMENT & PLAN:  Jaime [name] is a 87 y.o. female with   1. Adenocarcinoma of cecum with liver metastasis, stage IV, MMR normal, KRAD G12D (+)  -she presented with IDA, found to have cecum mass on imaging, colonoscopy was declined. After 3 months, she was found to have enlarging cecal mass and new liver masses. Liver biopsy confirmed metastatic colon adenocarcinoma, IHC was normal. -FO genomic sequencing showed MSI stable disease, low tumor burden, K-ras mutation positive, no other targetable mutations.  She is not a candidate for immunotherapy or EGFR inhibitor. -We agreed to proceed with single agent Xeloda, which she started 05/13/21. We attempted increase with C2 but she developed diarrhea. She now continues on 1000 mg in the morning and 1500 mg in the evening, 7 days on and 7 days off. -She started bevacizumab on 07/17/21. She is tolerating relatively well with mild fatigue. -due to her recent chest pain and diarrhea, will decrease xeloda to 1070m q12h day 1-7 every 14 days  -Her tumor marker CEA has decreased since she started chemo, indicating some response to treatment -restaging CT CAP on 09/17/21 showed unchanged primary site at cecum but worsened metastatic disease. I reviewed her scan images and discussed with the patient and her daughter. -Her prognosis is poor and her life expectancy is less than 6 months given the metastatic disease.  Due  to her advanced age, medical comorbidities and limited performance status, I do not think she can tolerate intravenous chemotherapy such as irinotecan or oxaliplatin, I think the benefit of Lonsurf or Stivarga are very limited.  I think her quality of life is more important at this stage.  I recommended palliative care and hospice.  After a detailed discussion, she would prefer to move to hospice. She is already under palliative care.  We reviewed logistics of hospice in detail, and will make referral today.    2. Symptom Management: High BP, Low appetite, weight loss -secondary to #1 and/or treatment -her weight is overall stable lately  -her BP has been high on treatment. I prescribed amlodipine 08/31/21.     PLAN: -cancel future appointments -[facility], I will be her attending when she is on hospice care -f/u open   No problem-specific Assessment & Plan notes found for this encounter.   SUMMARY OF ONCOLOGIC HISTORY: Oncology History  Adenocarcinoma of colon metastatic to liver ([facility]  06/2020 Miscellaneous   Patient well until + covid-19 in 06/2020 with DVT/PE at that time. Hospitalized for anticoagulation, discharged to SNF on eliquis. [facility]. Subsequent pneumonia 10/2020 and RUL nodule new from 2017 warranting surveillance. Ongoing weight loss and worsening anemia 12/2020 Hg 7.7 heme + stool s/p RBC and IV Feraheme. CT 01/2021 showed mass in the cecum near ileocecal valve. Pt declined colonoscopy. Repeat CT 04/2021 showed enlarging colon mass and new liver masses. Liver bx confirmed metastatic colon adeno carcinoma.    11/20/2020 Imaging   CT chest with contrast -COVID-19 pneumonia follow-up  IMPRESSION: No findings suspicious  for pneumonia. Lingular and right lower lobe opacities favor atelectasis.   Moderate right pleural effusion, increased. Trace left pleural effusion, new. No frank interstitial edema.   6 mm central right upper lobe nodule, unchanged  from recent CT, but new from 2017. Follow-up CT chest is suggested in 6 months.   02/12/2021 Imaging   CT AP with contrast IMPRESSION: 1. Abnormal, focal wall thickening in the medial cecum, proximal to the ileocecal valve. Imaging features highly suspicious for neoplasm in light of the reported history of blood in stool. 2. No evidence for metastatic disease in the abdomen or pelvis. No ileocolic lymphadenopathy. 3. 8 mm common bile duct diameter in the head of the pancreas, upper normal for patient age. 4. Stable right pleural effusion with collapse/consolidation in the right lower lobe comparing to CT chest 11/20/2020. 5. Left colonic diverticulosis without diverticulitis. 6. Aortic Atherosclerosis (ICD10-I70.0).   04/30/2021 Imaging   CT AP with contrast IMPRESSION: 1. Progressive infiltrative mass involving the cecum compared with previous CT 3 months ago, consistent with progressive colon cancer. 2. Interval development of several hypoattenuating liver lesions consistent with metastatic disease. No abdominal adenopathy or signs of peritoneal carcinomatosis. 3. Progressive intra and extrahepatic biliary dilatation of undetermined etiology. Given change from previous recent CT, correlation with liver function studies recommended. 4. Stable right pleural effusion and right basilar pulmonary opacity from recent prior studies. Of note, a right upper lobe pulmonary nodule was demonstrated on chest CT 11/20/2020 for which chest CT follow-up is recommended. That is potentially a metastasis. 5.  Aortic Atherosclerosis (ICD10-I70.0).   05/07/2021 Cancer Staging   Staging form: Colon and Rectum, AJCC 8th Edition - Clinical stage from 05/07/2021: Stage Unknown (cTX, cN0, pM1) - Signed by Alla Feeling, NP on 05/13/2021 Total positive nodes: 0 Laterality: Right Sites of metastasis: Liver    05/07/2021 Initial Biopsy   Liver biopsy FINAL MICROSCOPIC DIAGNOSIS: A. LEFT LIVER LESION, NEEDLE  CORE BIOPSY: - Adenocarcinoma.  ADDENDUM: Immunohistochemistry shows the adenocarcinoma is positive with cytokeratin 20 and CDX2 and is negative with cytokeratin 7, TTF-1 and Napsin A. The immunophenotype is consistent with metastatic colorectal adenocarcinoma. IHC Normal   05/07/2021 Miscellaneous   [facility]   Biomarker Findings -Microsatellite status - MS-Stable -Tumor Mutational Burden - 1 Muts/Mb  Genomic Findings -KRAS G12D -NRAS wildtype -APC R216 -ATM V455M -[id number] R353   05/13/2021 Initial Diagnosis   Adenocarcinoma of colon metastatic to liver ([place])   06/06/2021 Genetic Testing   Negative genetic testing:  No pathogenic variants detected on the [facility] Multi-Cancer + RNA panel. The report date is 06/06/2021.   The Multi-Cancer + RNA Panel offered by [facility] includes sequencing and/or deletion/duplication analysis of the following 84 genes:  AIP*, ALK, APC*, ATM*, AXIN2*, BAP1*, BARD1*, BLM*, BMPR1A*, BRCA1*, BRCA2*, BRIP1*, CASR, CDC73*, CDH1*, CDK4, CDKN1B*, CDKN1C*, CDKN2A, CEBPA, CHEK2*, CTNNA1*, DICER1*, DIS3L2*, EGFR, EPCAM, FH*, FLCN*, GATA2*, GPC3, GREM1, HOXB13, HRAS, KIT, MAX*, MEN1*, MET, MITF, MLH1*, MSH2*, MSH3*, MSH6*, MUTYH*, NBN*, NF1*, NF2*, NTHL1*, PALB2*, PDGFRA, PHOX2B, PMS2*, POLD1*, POLE*, POT1*, PRKAR1A*, PTCH1*, PTEN*, RAD50*, RAD51C*, RAD51D*, RB1*, RECQL4, RET, RUNX1*, SDHA*, SDHAF2*, SDHB*, SDHC*, SDHD*, SMAD4*, SMARCA4*, SMARCB1*, SMARCE1*, STK11*, SUFU*, TERC, TERT, TMEM127*, Tp53*, TSC1*, TSC2*, VHL*, WRN*, and WT1.  RNA analysis is performed for * genes.   07/17/2021 -  Chemotherapy   Patient is on Treatment Plan : COLORECTAL Bevacizumab q21d        INTERVAL HISTORY:  Jaime [name] is here for a follow up of metastatic colon cancer. She was  last seen by me on 08/31/21. She presents to the clinic accompanied by her daughter. She reports she had some leg cramp since her last visit. She reports her only concern from Xeloda is skin  darkening/drying. She also reports some diarrhea, which she managed with imodium. She notes she does not have much of an appetite.   All other systems were reviewed with the patient and are negative.  MEDICAL HISTORY:  Past Medical History:  Diagnosis Date   Arthritis    knees   Complication of anesthesia    problem 40 yrs ago Doctor told her anesthesia drugs caused a reaction, BP drops   Family history of breast cancer    Family history of ovarian cancer    Family history of pancreatic cancer    Hypertension     SURGICAL HISTORY: Past Surgical History:  Procedure Laterality Date   ABDOMINAL HYSTERECTOMY     CATARACT EXTRACTION W/PHACO Left 08/09/2018   Procedure: CATARACT EXTRACTION PHACO AND INTRAOCULAR LENS PLACEMENT ([place])  ISTENT AND ISTENT INJECT LEFT;  Surgeon: Leandrew Koyanagi, MD;  Location: [place];  Service: Ophthalmology;  Laterality: Left;   CHOLECYSTECTOMY     EYE SURGERY     cataract with lens implant-right   INSERTION OF ANTERIOR SEGMENT AQUEOUS DRAINAGE DEVICE (ISTENT) Left 08/09/2018   Procedure: INSERTION OF ANTERIOR SEGMENT AQUEOUS DRAINAGE DEVICE (ISTENT);  Surgeon: Leandrew Koyanagi, MD;  Location: [place];  Service: Ophthalmology;  Laterality: Left;   TOTAL KNEE ARTHROPLASTY Right 02/27/2014   Procedure: RIGHT TOTAL KNEE ARTHROPLASTY;  Surgeon: Tobi Bastos, MD;  Location: [facility];  Service: Orthopedics;  Laterality: Right;   TUBAL LIGATION      I have reviewed the social history and family history with the patient and they are unchanged from previous note.  ALLERGIES:  is allergic to cefdinir, codeine, ibandronic acid, meperidine hcl, adhesive [tape], sulfa antibiotics, and sulfonamide derivatives.  MEDICATIONS:  Current Outpatient Medications  Medication Sig Dispense Refill   acetaminophen (TYLENOL) 500 MG tablet Take 500 mg by mouth every 6 (six) hours as needed for moderate pain or headache.     albuterol (VENTOLIN HFA)  108 (90 Base) MCG/ACT inhaler Inhale 2 puffs into the lungs every 4 (four) hours as needed for wheezing or shortness of breath.     amLODipine (NORVASC) 2.5 MG tablet Take 1 tablet (2.5 mg total) by mouth daily. 30 tablet 1   brimonidine (ALPHAGAN) 0.2 % ophthalmic solution Place 1 drop into the left eye 2 (two) times daily.     buPROPion (WELLBUTRIN XL) 150 MG 24 hr tablet Take 150 mg by mouth daily.      capecitabine (XELODA) 500 MG tablet Take 3 tablets in morning and 3 tablets in evening every 12 hours. Take for 7 days on and 7 days off. Take within 30 minutes after meals. 84 tablet 0   capecitabine (XELODA) 500 MG tablet Take 2 tablets by mouth every 12 hours. Take for 7 days on and 7 days off. Take within 30 minutes after meals. 56 tablet 1   cholecalciferol (VITAMIN D3) 25 MCG (1000 UNIT) tablet Take 1,000 Units by mouth daily.     dorzolamide-timolol (COSOPT) 22.3-6.8 MG/ML ophthalmic solution Place 1 drop into both eyes 2 (two) times daily.     escitalopram (LEXAPRO) 5 MG tablet Take 5 mg by mouth daily.     famotidine (PEPCID) 20 MG tablet TAKE 1 TABLET BY MOUTH EVERY DAY 30 tablet 1   Fe Fum-FePoly-Vit C-Vit  B3 (INTEGRA) 62.5-62.5-40-3 MG CAPS Take 1 capsule by mouth daily.     fexofenadine (ALLEGRA) 180 MG tablet Take 180 mg by mouth daily as needed for allergies or rhinitis.     fluticasone (FLONASE) 50 MCG/ACT nasal spray Place 1 spray into both nostrils daily as needed for allergies or rhinitis.     furosemide (LASIX) 20 MG tablet Take one tablet (31m total) by mouth every other day, alternating with two tablets (458mtotal) by mouth every other day. (Patient taking differently: Take 20-40 mg by mouth See admin instructions. Take one tablet (2043motal) by mouth every other day, alternating with two tablets (78m30mtal) by mouth every other day.) 132 tablet 3   Guaifenesin (MUCINEX MAXIMUM STRENGTH) 1200 MG TB12 Take 1,200 mg by mouth 2 (two) times daily as needed (congestion).      KLOR-CON M20 20 MEQ tablet Take 10 mEq by mouth every Monday, Wednesday, and Friday.     latanoprost (XALATAN) 0.005 % ophthalmic solution Place 1 drop into both eyes at bedtime.     loperamide (IMODIUM A-D) 2 MG tablet Take 2-4 mg by mouth 4 (four) times daily as needed for diarrhea or loose stools.     Multiple Vitamins-Minerals (ADULT GUMMY PO) Take 2 capsules by mouth daily.     ondansetron (ZOFRAN) 8 MG tablet Take 1 tablet (8 mg total) by mouth every 8 (eight) hours as needed for nausea or vomiting. 20 tablet 1   rosuvastatin (CRESTOR) 10 MG tablet Take 10 mg by mouth daily.     vitamin C (ASCORBIC ACID) 500 MG tablet Take 500 mg by mouth daily.     No current facility-administered medications for this visit.    PHYSICAL EXAMINATION: ECOG PERFORMANCE STATUS: 2 - Symptomatic, <50% confined to bed  There were no vitals filed for this visit. Wt Readings from Last 3 Encounters:  08/31/21 181 lb 3.2 oz (82.2 kg)  08/10/21 179 lb 9.6 oz (81.5 kg)  07/17/21 181 lb 14.4 oz (82.5 kg)     GENERAL:alert, no distress and comfortable SKIN: skin color normal, no rashes or significant lesions EYES: normal, Conjunctiva are pink and non-injected, sclera clear  NEURO: alert & oriented x 3 with fluent speech  LABORATORY DATA:  I have reviewed the data as listed CBC Latest Ref Rng & Units 09/23/2021 08/31/2021 08/10/2021  WBC 4.0 - 10.5 K/uL 7.1 6.7 7.1  Hemoglobin 12.0 - 15.0 g/dL 11.3(L) 10.9(L) 11.4(L)  Hematocrit 36.0 - 46.0 % 34.7(L) 33.5(L) 35.1(L)  Platelets 150 - 400 K/uL 195 186 251     CMP Latest Ref Rng & Units 08/31/2021 08/10/2021 07/17/2021  Glucose 70 - 99 mg/dL 99 90 94  BUN 8 - 23 mg/dL 20 19 21   Creatinine 0.44 - 1.00 mg/dL 1.00 0.98 1.01(H)  Sodium 135 - 145 mmol/L 142 143 141  Potassium 3.5 - 5.1 mmol/L 4.7 4.4 4.5  Chloride 98 - 111 mmol/L 110 108 106  CO2 22 - 32 mmol/L 25 27 27   Calcium 8.9 - 10.3 mg/dL 9.1 9.8 9.2  Total Protein 6.5 - 8.1 g/dL 6.8 7.2 7.1  Total  Bilirubin 0.3 - 1.2 mg/dL 0.9 0.7 0.7  Alkaline Phos 38 - 126 U/L 67 80 64  AST 15 - 41 U/L 21 19 20   ALT 0 - 44 U/L 9 9 8       RADIOGRAPHIC STUDIES: I have personally reviewed the radiological images as listed and agreed with the findings in the report. No results found.  No orders of the defined types were placed in this encounter.  All questions were answered. The patient knows to call the clinic with any problems, questions or concerns. No barriers to learning was detected. The total time spent in the appointment was 30 minutes.     Truitt Merle, MD 09/23/2021   I, Wilburn Mylar, am acting as scribe for Truitt Merle, MD.   I have reviewed the above documentation for accuracy and completeness, and I agree with the above.

## 2021-09-23 NOTE — Progress Notes (Signed)
Referral order placed for Hospice and faxed over [place] [phone]) per Dr. Ernestina Penna verbal order.

## 2021-09-24 DIAGNOSIS — F339 Major depressive disorder, recurrent, unspecified: Secondary | ICD-10-CM | POA: Diagnosis not present

## 2021-09-24 DIAGNOSIS — I10 Essential (primary) hypertension: Secondary | ICD-10-CM | POA: Diagnosis not present

## 2021-09-24 DIAGNOSIS — M1712 Unilateral primary osteoarthritis, left knee: Secondary | ICD-10-CM | POA: Diagnosis not present

## 2021-09-24 DIAGNOSIS — C189 Malignant neoplasm of colon, unspecified: Secondary | ICD-10-CM | POA: Diagnosis not present

## 2021-09-24 DIAGNOSIS — D860 Sarcoidosis of lung: Secondary | ICD-10-CM | POA: Diagnosis not present

## 2021-09-24 DIAGNOSIS — D649 Anemia, unspecified: Secondary | ICD-10-CM | POA: Diagnosis not present

## 2021-09-25 DIAGNOSIS — C189 Malignant neoplasm of colon, unspecified: Secondary | ICD-10-CM | POA: Diagnosis not present

## 2021-09-25 DIAGNOSIS — C787 Secondary malignant neoplasm of liver and intrahepatic bile duct: Secondary | ICD-10-CM | POA: Diagnosis not present

## 2021-09-25 DIAGNOSIS — Z6830 Body mass index (BMI) 30.0-30.9, adult: Secondary | ICD-10-CM | POA: Diagnosis not present

## 2021-09-25 DIAGNOSIS — H4010X0 Unspecified open-angle glaucoma, stage unspecified: Secondary | ICD-10-CM | POA: Diagnosis not present

## 2021-09-25 DIAGNOSIS — D500 Iron deficiency anemia secondary to blood loss (chronic): Secondary | ICD-10-CM | POA: Diagnosis not present

## 2021-09-25 DIAGNOSIS — E785 Hyperlipidemia, unspecified: Secondary | ICD-10-CM | POA: Diagnosis not present

## 2021-09-25 DIAGNOSIS — K219 Gastro-esophageal reflux disease without esophagitis: Secondary | ICD-10-CM | POA: Diagnosis not present

## 2021-09-25 DIAGNOSIS — I10 Essential (primary) hypertension: Secondary | ICD-10-CM | POA: Diagnosis not present

## 2021-09-25 DIAGNOSIS — F32A Depression, unspecified: Secondary | ICD-10-CM | POA: Diagnosis not present

## 2021-09-25 DIAGNOSIS — Z8709 Personal history of other diseases of the respiratory system: Secondary | ICD-10-CM | POA: Diagnosis not present

## 2021-09-25 DIAGNOSIS — Z86711 Personal history of pulmonary embolism: Secondary | ICD-10-CM | POA: Diagnosis not present

## 2021-09-25 DIAGNOSIS — M1990 Unspecified osteoarthritis, unspecified site: Secondary | ICD-10-CM | POA: Diagnosis not present

## 2021-09-25 DIAGNOSIS — M810 Age-related osteoporosis without current pathological fracture: Secondary | ICD-10-CM | POA: Diagnosis not present

## 2021-09-25 DIAGNOSIS — J309 Allergic rhinitis, unspecified: Secondary | ICD-10-CM | POA: Diagnosis not present

## 2021-09-25 DIAGNOSIS — Z8616 Personal history of COVID-19: Secondary | ICD-10-CM | POA: Diagnosis not present

## 2021-09-27 ENCOUNTER — Encounter: Payer: Self-pay | Admitting: Hematology

## 2021-09-28 DIAGNOSIS — I10 Essential (primary) hypertension: Secondary | ICD-10-CM | POA: Diagnosis not present

## 2021-09-28 DIAGNOSIS — K219 Gastro-esophageal reflux disease without esophagitis: Secondary | ICD-10-CM | POA: Diagnosis not present

## 2021-09-28 DIAGNOSIS — C787 Secondary malignant neoplasm of liver and intrahepatic bile duct: Secondary | ICD-10-CM | POA: Diagnosis not present

## 2021-09-28 DIAGNOSIS — D500 Iron deficiency anemia secondary to blood loss (chronic): Secondary | ICD-10-CM | POA: Diagnosis not present

## 2021-09-28 DIAGNOSIS — E785 Hyperlipidemia, unspecified: Secondary | ICD-10-CM | POA: Diagnosis not present

## 2021-09-28 DIAGNOSIS — C189 Malignant neoplasm of colon, unspecified: Secondary | ICD-10-CM | POA: Diagnosis not present

## 2021-10-03 ENCOUNTER — Other Ambulatory Visit: Payer: Self-pay | Admitting: Hematology

## 2021-10-04 ENCOUNTER — Encounter: Payer: Self-pay | Admitting: Hematology

## 2021-10-05 DIAGNOSIS — K219 Gastro-esophageal reflux disease without esophagitis: Secondary | ICD-10-CM | POA: Diagnosis not present

## 2021-10-05 DIAGNOSIS — C787 Secondary malignant neoplasm of liver and intrahepatic bile duct: Secondary | ICD-10-CM | POA: Diagnosis not present

## 2021-10-05 DIAGNOSIS — I10 Essential (primary) hypertension: Secondary | ICD-10-CM | POA: Diagnosis not present

## 2021-10-05 DIAGNOSIS — E785 Hyperlipidemia, unspecified: Secondary | ICD-10-CM | POA: Diagnosis not present

## 2021-10-05 DIAGNOSIS — C189 Malignant neoplasm of colon, unspecified: Secondary | ICD-10-CM | POA: Diagnosis not present

## 2021-10-05 DIAGNOSIS — D500 Iron deficiency anemia secondary to blood loss (chronic): Secondary | ICD-10-CM | POA: Diagnosis not present

## 2021-10-06 DIAGNOSIS — D649 Anemia, unspecified: Secondary | ICD-10-CM | POA: Diagnosis not present

## 2021-10-06 DIAGNOSIS — M1712 Unilateral primary osteoarthritis, left knee: Secondary | ICD-10-CM | POA: Diagnosis not present

## 2021-10-06 DIAGNOSIS — C189 Malignant neoplasm of colon, unspecified: Secondary | ICD-10-CM | POA: Diagnosis not present

## 2021-10-06 DIAGNOSIS — I10 Essential (primary) hypertension: Secondary | ICD-10-CM | POA: Diagnosis not present

## 2021-10-06 DIAGNOSIS — D860 Sarcoidosis of lung: Secondary | ICD-10-CM | POA: Diagnosis not present

## 2021-10-06 DIAGNOSIS — F339 Major depressive disorder, recurrent, unspecified: Secondary | ICD-10-CM | POA: Diagnosis not present

## 2021-10-08 DIAGNOSIS — E785 Hyperlipidemia, unspecified: Secondary | ICD-10-CM | POA: Diagnosis not present

## 2021-10-08 DIAGNOSIS — K219 Gastro-esophageal reflux disease without esophagitis: Secondary | ICD-10-CM | POA: Diagnosis not present

## 2021-10-08 DIAGNOSIS — D500 Iron deficiency anemia secondary to blood loss (chronic): Secondary | ICD-10-CM | POA: Diagnosis not present

## 2021-10-08 DIAGNOSIS — C189 Malignant neoplasm of colon, unspecified: Secondary | ICD-10-CM | POA: Diagnosis not present

## 2021-10-08 DIAGNOSIS — I10 Essential (primary) hypertension: Secondary | ICD-10-CM | POA: Diagnosis not present

## 2021-10-08 DIAGNOSIS — C787 Secondary malignant neoplasm of liver and intrahepatic bile duct: Secondary | ICD-10-CM | POA: Diagnosis not present

## 2021-10-12 ENCOUNTER — Other Ambulatory Visit: Payer: Self-pay | Admitting: Hematology

## 2021-10-12 ENCOUNTER — Other Ambulatory Visit: Payer: Self-pay

## 2021-10-12 DIAGNOSIS — I10 Essential (primary) hypertension: Secondary | ICD-10-CM | POA: Diagnosis not present

## 2021-10-12 DIAGNOSIS — E785 Hyperlipidemia, unspecified: Secondary | ICD-10-CM | POA: Diagnosis not present

## 2021-10-12 DIAGNOSIS — C787 Secondary malignant neoplasm of liver and intrahepatic bile duct: Secondary | ICD-10-CM | POA: Diagnosis not present

## 2021-10-12 DIAGNOSIS — K219 Gastro-esophageal reflux disease without esophagitis: Secondary | ICD-10-CM | POA: Diagnosis not present

## 2021-10-12 DIAGNOSIS — D500 Iron deficiency anemia secondary to blood loss (chronic): Secondary | ICD-10-CM | POA: Diagnosis not present

## 2021-10-12 DIAGNOSIS — C189 Malignant neoplasm of colon, unspecified: Secondary | ICD-10-CM | POA: Diagnosis not present

## 2021-10-12 MED ORDER — TRAMADOL HCL 50 MG PO TABS: 25.0000 mg | ORAL_TABLET | Freq: Four times a day (QID) | ORAL | 0 refills | Status: DC | PRN

## 2021-10-12 NOTE — Progress Notes (Signed)
Anastasia Pall, RN with [facility] [phone]) called stating pt has 7/10 abdominal pain and is requesting pain medication.  Pt is requesting Tramadol for now.  Notified Dr. Burr Medico of the Hospice RN's request for pain medication. Dr Burr Medico placed order for Tramadol.

## 2021-10-19 DIAGNOSIS — E785 Hyperlipidemia, unspecified: Secondary | ICD-10-CM | POA: Diagnosis not present

## 2021-10-19 DIAGNOSIS — D500 Iron deficiency anemia secondary to blood loss (chronic): Secondary | ICD-10-CM | POA: Diagnosis not present

## 2021-10-19 DIAGNOSIS — I10 Essential (primary) hypertension: Secondary | ICD-10-CM | POA: Diagnosis not present

## 2021-10-19 DIAGNOSIS — C189 Malignant neoplasm of colon, unspecified: Secondary | ICD-10-CM | POA: Diagnosis not present

## 2021-10-19 DIAGNOSIS — K219 Gastro-esophageal reflux disease without esophagitis: Secondary | ICD-10-CM | POA: Diagnosis not present

## 2021-10-19 DIAGNOSIS — C787 Secondary malignant neoplasm of liver and intrahepatic bile duct: Secondary | ICD-10-CM | POA: Diagnosis not present

## 2021-10-20 DIAGNOSIS — K219 Gastro-esophageal reflux disease without esophagitis: Secondary | ICD-10-CM | POA: Diagnosis not present

## 2021-10-20 DIAGNOSIS — D500 Iron deficiency anemia secondary to blood loss (chronic): Secondary | ICD-10-CM | POA: Diagnosis not present

## 2021-10-20 DIAGNOSIS — I10 Essential (primary) hypertension: Secondary | ICD-10-CM | POA: Diagnosis not present

## 2021-10-20 DIAGNOSIS — C189 Malignant neoplasm of colon, unspecified: Secondary | ICD-10-CM | POA: Diagnosis not present

## 2021-10-20 DIAGNOSIS — C787 Secondary malignant neoplasm of liver and intrahepatic bile duct: Secondary | ICD-10-CM | POA: Diagnosis not present

## 2021-10-20 DIAGNOSIS — E785 Hyperlipidemia, unspecified: Secondary | ICD-10-CM | POA: Diagnosis not present

## 2021-10-21 DIAGNOSIS — I10 Essential (primary) hypertension: Secondary | ICD-10-CM | POA: Diagnosis not present

## 2021-10-21 DIAGNOSIS — C189 Malignant neoplasm of colon, unspecified: Secondary | ICD-10-CM | POA: Diagnosis not present

## 2021-10-21 DIAGNOSIS — D500 Iron deficiency anemia secondary to blood loss (chronic): Secondary | ICD-10-CM | POA: Diagnosis not present

## 2021-10-21 DIAGNOSIS — E785 Hyperlipidemia, unspecified: Secondary | ICD-10-CM | POA: Diagnosis not present

## 2021-10-21 DIAGNOSIS — C787 Secondary malignant neoplasm of liver and intrahepatic bile duct: Secondary | ICD-10-CM | POA: Diagnosis not present

## 2021-10-21 DIAGNOSIS — K219 Gastro-esophageal reflux disease without esophagitis: Secondary | ICD-10-CM | POA: Diagnosis not present

## 2021-10-22 DIAGNOSIS — I10 Essential (primary) hypertension: Secondary | ICD-10-CM | POA: Diagnosis not present

## 2021-10-22 DIAGNOSIS — F32A Depression, unspecified: Secondary | ICD-10-CM | POA: Diagnosis not present

## 2021-10-22 DIAGNOSIS — E785 Hyperlipidemia, unspecified: Secondary | ICD-10-CM | POA: Diagnosis not present

## 2021-10-22 DIAGNOSIS — Z8709 Personal history of other diseases of the respiratory system: Secondary | ICD-10-CM | POA: Diagnosis not present

## 2021-10-22 DIAGNOSIS — K219 Gastro-esophageal reflux disease without esophagitis: Secondary | ICD-10-CM | POA: Diagnosis not present

## 2021-10-22 DIAGNOSIS — Z8616 Personal history of COVID-19: Secondary | ICD-10-CM | POA: Diagnosis not present

## 2021-10-22 DIAGNOSIS — J309 Allergic rhinitis, unspecified: Secondary | ICD-10-CM | POA: Diagnosis not present

## 2021-10-22 DIAGNOSIS — D500 Iron deficiency anemia secondary to blood loss (chronic): Secondary | ICD-10-CM | POA: Diagnosis not present

## 2021-10-22 DIAGNOSIS — C189 Malignant neoplasm of colon, unspecified: Secondary | ICD-10-CM | POA: Diagnosis not present

## 2021-10-22 DIAGNOSIS — C787 Secondary malignant neoplasm of liver and intrahepatic bile duct: Secondary | ICD-10-CM | POA: Diagnosis not present

## 2021-10-22 DIAGNOSIS — Z6830 Body mass index (BMI) 30.0-30.9, adult: Secondary | ICD-10-CM | POA: Diagnosis not present

## 2021-10-22 DIAGNOSIS — H4010X0 Unspecified open-angle glaucoma, stage unspecified: Secondary | ICD-10-CM | POA: Diagnosis not present

## 2021-10-22 DIAGNOSIS — M810 Age-related osteoporosis without current pathological fracture: Secondary | ICD-10-CM | POA: Diagnosis not present

## 2021-10-22 DIAGNOSIS — M1990 Unspecified osteoarthritis, unspecified site: Secondary | ICD-10-CM | POA: Diagnosis not present

## 2021-10-22 DIAGNOSIS — Z86711 Personal history of pulmonary embolism: Secondary | ICD-10-CM | POA: Diagnosis not present

## 2021-10-25 ENCOUNTER — Other Ambulatory Visit: Payer: Self-pay | Admitting: Hematology

## 2021-10-26 DIAGNOSIS — E785 Hyperlipidemia, unspecified: Secondary | ICD-10-CM | POA: Diagnosis not present

## 2021-10-26 DIAGNOSIS — I10 Essential (primary) hypertension: Secondary | ICD-10-CM | POA: Diagnosis not present

## 2021-10-26 DIAGNOSIS — K219 Gastro-esophageal reflux disease without esophagitis: Secondary | ICD-10-CM | POA: Diagnosis not present

## 2021-10-26 DIAGNOSIS — D500 Iron deficiency anemia secondary to blood loss (chronic): Secondary | ICD-10-CM | POA: Diagnosis not present

## 2021-10-26 DIAGNOSIS — C189 Malignant neoplasm of colon, unspecified: Secondary | ICD-10-CM | POA: Diagnosis not present

## 2021-10-26 DIAGNOSIS — C787 Secondary malignant neoplasm of liver and intrahepatic bile duct: Secondary | ICD-10-CM | POA: Diagnosis not present

## 2021-10-28 DIAGNOSIS — E785 Hyperlipidemia, unspecified: Secondary | ICD-10-CM | POA: Diagnosis not present

## 2021-10-28 DIAGNOSIS — I10 Essential (primary) hypertension: Secondary | ICD-10-CM | POA: Diagnosis not present

## 2021-10-28 DIAGNOSIS — K219 Gastro-esophageal reflux disease without esophagitis: Secondary | ICD-10-CM | POA: Diagnosis not present

## 2021-10-28 DIAGNOSIS — C189 Malignant neoplasm of colon, unspecified: Secondary | ICD-10-CM | POA: Diagnosis not present

## 2021-10-28 DIAGNOSIS — D500 Iron deficiency anemia secondary to blood loss (chronic): Secondary | ICD-10-CM | POA: Diagnosis not present

## 2021-10-28 DIAGNOSIS — C787 Secondary malignant neoplasm of liver and intrahepatic bile duct: Secondary | ICD-10-CM | POA: Diagnosis not present

## 2021-10-29 DIAGNOSIS — K219 Gastro-esophageal reflux disease without esophagitis: Secondary | ICD-10-CM | POA: Diagnosis not present

## 2021-10-29 DIAGNOSIS — E785 Hyperlipidemia, unspecified: Secondary | ICD-10-CM | POA: Diagnosis not present

## 2021-10-29 DIAGNOSIS — C787 Secondary malignant neoplasm of liver and intrahepatic bile duct: Secondary | ICD-10-CM | POA: Diagnosis not present

## 2021-10-29 DIAGNOSIS — C189 Malignant neoplasm of colon, unspecified: Secondary | ICD-10-CM | POA: Diagnosis not present

## 2021-10-29 DIAGNOSIS — I10 Essential (primary) hypertension: Secondary | ICD-10-CM | POA: Diagnosis not present

## 2021-10-29 DIAGNOSIS — D500 Iron deficiency anemia secondary to blood loss (chronic): Secondary | ICD-10-CM | POA: Diagnosis not present

## 2021-11-02 DIAGNOSIS — C189 Malignant neoplasm of colon, unspecified: Secondary | ICD-10-CM | POA: Diagnosis not present

## 2021-11-02 DIAGNOSIS — C787 Secondary malignant neoplasm of liver and intrahepatic bile duct: Secondary | ICD-10-CM | POA: Diagnosis not present

## 2021-11-02 DIAGNOSIS — I10 Essential (primary) hypertension: Secondary | ICD-10-CM | POA: Diagnosis not present

## 2021-11-02 DIAGNOSIS — K219 Gastro-esophageal reflux disease without esophagitis: Secondary | ICD-10-CM | POA: Diagnosis not present

## 2021-11-02 DIAGNOSIS — D500 Iron deficiency anemia secondary to blood loss (chronic): Secondary | ICD-10-CM | POA: Diagnosis not present

## 2021-11-02 DIAGNOSIS — E785 Hyperlipidemia, unspecified: Secondary | ICD-10-CM | POA: Diagnosis not present

## 2021-11-03 DIAGNOSIS — I10 Essential (primary) hypertension: Secondary | ICD-10-CM | POA: Diagnosis not present

## 2021-11-03 DIAGNOSIS — E785 Hyperlipidemia, unspecified: Secondary | ICD-10-CM | POA: Diagnosis not present

## 2021-11-03 DIAGNOSIS — K219 Gastro-esophageal reflux disease without esophagitis: Secondary | ICD-10-CM | POA: Diagnosis not present

## 2021-11-03 DIAGNOSIS — D500 Iron deficiency anemia secondary to blood loss (chronic): Secondary | ICD-10-CM | POA: Diagnosis not present

## 2021-11-03 DIAGNOSIS — C787 Secondary malignant neoplasm of liver and intrahepatic bile duct: Secondary | ICD-10-CM | POA: Diagnosis not present

## 2021-11-03 DIAGNOSIS — C189 Malignant neoplasm of colon, unspecified: Secondary | ICD-10-CM | POA: Diagnosis not present

## 2021-11-04 DIAGNOSIS — C787 Secondary malignant neoplasm of liver and intrahepatic bile duct: Secondary | ICD-10-CM | POA: Diagnosis not present

## 2021-11-04 DIAGNOSIS — K219 Gastro-esophageal reflux disease without esophagitis: Secondary | ICD-10-CM | POA: Diagnosis not present

## 2021-11-04 DIAGNOSIS — I10 Essential (primary) hypertension: Secondary | ICD-10-CM | POA: Diagnosis not present

## 2021-11-04 DIAGNOSIS — D500 Iron deficiency anemia secondary to blood loss (chronic): Secondary | ICD-10-CM | POA: Diagnosis not present

## 2021-11-04 DIAGNOSIS — E785 Hyperlipidemia, unspecified: Secondary | ICD-10-CM | POA: Diagnosis not present

## 2021-11-04 DIAGNOSIS — C189 Malignant neoplasm of colon, unspecified: Secondary | ICD-10-CM | POA: Diagnosis not present

## 2021-11-06 DIAGNOSIS — C189 Malignant neoplasm of colon, unspecified: Secondary | ICD-10-CM | POA: Diagnosis not present

## 2021-11-06 DIAGNOSIS — K219 Gastro-esophageal reflux disease without esophagitis: Secondary | ICD-10-CM | POA: Diagnosis not present

## 2021-11-06 DIAGNOSIS — I10 Essential (primary) hypertension: Secondary | ICD-10-CM | POA: Diagnosis not present

## 2021-11-06 DIAGNOSIS — E785 Hyperlipidemia, unspecified: Secondary | ICD-10-CM | POA: Diagnosis not present

## 2021-11-06 DIAGNOSIS — D500 Iron deficiency anemia secondary to blood loss (chronic): Secondary | ICD-10-CM | POA: Diagnosis not present

## 2021-11-06 DIAGNOSIS — C787 Secondary malignant neoplasm of liver and intrahepatic bile duct: Secondary | ICD-10-CM | POA: Diagnosis not present

## 2021-11-08 ENCOUNTER — Other Ambulatory Visit: Payer: Self-pay | Admitting: Hematology

## 2021-11-10 DIAGNOSIS — D500 Iron deficiency anemia secondary to blood loss (chronic): Secondary | ICD-10-CM | POA: Diagnosis not present

## 2021-11-10 DIAGNOSIS — E785 Hyperlipidemia, unspecified: Secondary | ICD-10-CM | POA: Diagnosis not present

## 2021-11-10 DIAGNOSIS — K219 Gastro-esophageal reflux disease without esophagitis: Secondary | ICD-10-CM | POA: Diagnosis not present

## 2021-11-10 DIAGNOSIS — C189 Malignant neoplasm of colon, unspecified: Secondary | ICD-10-CM | POA: Diagnosis not present

## 2021-11-10 DIAGNOSIS — I10 Essential (primary) hypertension: Secondary | ICD-10-CM | POA: Diagnosis not present

## 2021-11-10 DIAGNOSIS — C787 Secondary malignant neoplasm of liver and intrahepatic bile duct: Secondary | ICD-10-CM | POA: Diagnosis not present

## 2021-11-11 DIAGNOSIS — D500 Iron deficiency anemia secondary to blood loss (chronic): Secondary | ICD-10-CM | POA: Diagnosis not present

## 2021-11-11 DIAGNOSIS — C787 Secondary malignant neoplasm of liver and intrahepatic bile duct: Secondary | ICD-10-CM | POA: Diagnosis not present

## 2021-11-11 DIAGNOSIS — I10 Essential (primary) hypertension: Secondary | ICD-10-CM | POA: Diagnosis not present

## 2021-11-11 DIAGNOSIS — K219 Gastro-esophageal reflux disease without esophagitis: Secondary | ICD-10-CM | POA: Diagnosis not present

## 2021-11-11 DIAGNOSIS — E785 Hyperlipidemia, unspecified: Secondary | ICD-10-CM | POA: Diagnosis not present

## 2021-11-11 DIAGNOSIS — C189 Malignant neoplasm of colon, unspecified: Secondary | ICD-10-CM | POA: Diagnosis not present

## 2021-11-13 ENCOUNTER — Encounter: Payer: Self-pay | Admitting: Hematology

## 2021-11-13 DIAGNOSIS — E785 Hyperlipidemia, unspecified: Secondary | ICD-10-CM | POA: Diagnosis not present

## 2021-11-13 DIAGNOSIS — I10 Essential (primary) hypertension: Secondary | ICD-10-CM | POA: Diagnosis not present

## 2021-11-13 DIAGNOSIS — C189 Malignant neoplasm of colon, unspecified: Secondary | ICD-10-CM | POA: Diagnosis not present

## 2021-11-13 DIAGNOSIS — C787 Secondary malignant neoplasm of liver and intrahepatic bile duct: Secondary | ICD-10-CM | POA: Diagnosis not present

## 2021-11-13 DIAGNOSIS — K219 Gastro-esophageal reflux disease without esophagitis: Secondary | ICD-10-CM | POA: Diagnosis not present

## 2021-11-13 DIAGNOSIS — D500 Iron deficiency anemia secondary to blood loss (chronic): Secondary | ICD-10-CM | POA: Diagnosis not present

## 2021-11-16 DIAGNOSIS — E785 Hyperlipidemia, unspecified: Secondary | ICD-10-CM | POA: Diagnosis not present

## 2021-11-16 DIAGNOSIS — C189 Malignant neoplasm of colon, unspecified: Secondary | ICD-10-CM | POA: Diagnosis not present

## 2021-11-16 DIAGNOSIS — D500 Iron deficiency anemia secondary to blood loss (chronic): Secondary | ICD-10-CM | POA: Diagnosis not present

## 2021-11-16 DIAGNOSIS — C787 Secondary malignant neoplasm of liver and intrahepatic bile duct: Secondary | ICD-10-CM | POA: Diagnosis not present

## 2021-11-16 DIAGNOSIS — K219 Gastro-esophageal reflux disease without esophagitis: Secondary | ICD-10-CM | POA: Diagnosis not present

## 2021-11-16 DIAGNOSIS — I10 Essential (primary) hypertension: Secondary | ICD-10-CM | POA: Diagnosis not present

## 2021-11-17 DIAGNOSIS — C787 Secondary malignant neoplasm of liver and intrahepatic bile duct: Secondary | ICD-10-CM | POA: Diagnosis not present

## 2021-11-17 DIAGNOSIS — I10 Essential (primary) hypertension: Secondary | ICD-10-CM | POA: Diagnosis not present

## 2021-11-17 DIAGNOSIS — C189 Malignant neoplasm of colon, unspecified: Secondary | ICD-10-CM | POA: Diagnosis not present

## 2021-11-17 DIAGNOSIS — K219 Gastro-esophageal reflux disease without esophagitis: Secondary | ICD-10-CM | POA: Diagnosis not present

## 2021-11-17 DIAGNOSIS — E785 Hyperlipidemia, unspecified: Secondary | ICD-10-CM | POA: Diagnosis not present

## 2021-11-17 DIAGNOSIS — D500 Iron deficiency anemia secondary to blood loss (chronic): Secondary | ICD-10-CM | POA: Diagnosis not present

## 2021-11-19 DIAGNOSIS — C787 Secondary malignant neoplasm of liver and intrahepatic bile duct: Secondary | ICD-10-CM | POA: Diagnosis not present

## 2021-11-19 DIAGNOSIS — C189 Malignant neoplasm of colon, unspecified: Secondary | ICD-10-CM | POA: Diagnosis not present

## 2021-11-19 DIAGNOSIS — K219 Gastro-esophageal reflux disease without esophagitis: Secondary | ICD-10-CM | POA: Diagnosis not present

## 2021-11-19 DIAGNOSIS — I10 Essential (primary) hypertension: Secondary | ICD-10-CM | POA: Diagnosis not present

## 2021-11-19 DIAGNOSIS — E785 Hyperlipidemia, unspecified: Secondary | ICD-10-CM | POA: Diagnosis not present

## 2021-11-19 DIAGNOSIS — D500 Iron deficiency anemia secondary to blood loss (chronic): Secondary | ICD-10-CM | POA: Diagnosis not present

## 2021-11-20 DIAGNOSIS — M8580 Other specified disorders of bone density and structure, unspecified site: Secondary | ICD-10-CM | POA: Diagnosis not present

## 2021-11-20 DIAGNOSIS — C787 Secondary malignant neoplasm of liver and intrahepatic bile duct: Secondary | ICD-10-CM | POA: Diagnosis not present

## 2021-11-20 DIAGNOSIS — I10 Essential (primary) hypertension: Secondary | ICD-10-CM | POA: Diagnosis not present

## 2021-11-20 DIAGNOSIS — E785 Hyperlipidemia, unspecified: Secondary | ICD-10-CM | POA: Diagnosis not present

## 2021-11-20 DIAGNOSIS — C189 Malignant neoplasm of colon, unspecified: Secondary | ICD-10-CM | POA: Diagnosis not present

## 2021-11-20 DIAGNOSIS — D500 Iron deficiency anemia secondary to blood loss (chronic): Secondary | ICD-10-CM | POA: Diagnosis not present

## 2021-11-20 DIAGNOSIS — K219 Gastro-esophageal reflux disease without esophagitis: Secondary | ICD-10-CM | POA: Diagnosis not present

## 2021-11-21 DIAGNOSIS — C787 Secondary malignant neoplasm of liver and intrahepatic bile duct: Secondary | ICD-10-CM | POA: Diagnosis not present

## 2021-11-21 DIAGNOSIS — E785 Hyperlipidemia, unspecified: Secondary | ICD-10-CM | POA: Diagnosis not present

## 2021-11-21 DIAGNOSIS — C189 Malignant neoplasm of colon, unspecified: Secondary | ICD-10-CM | POA: Diagnosis not present

## 2021-11-21 DIAGNOSIS — D500 Iron deficiency anemia secondary to blood loss (chronic): Secondary | ICD-10-CM | POA: Diagnosis not present

## 2021-11-21 DIAGNOSIS — K219 Gastro-esophageal reflux disease without esophagitis: Secondary | ICD-10-CM | POA: Diagnosis not present

## 2021-11-21 DIAGNOSIS — I10 Essential (primary) hypertension: Secondary | ICD-10-CM | POA: Diagnosis not present

## 2021-11-22 ENCOUNTER — Encounter: Payer: Self-pay | Admitting: Hematology

## 2021-11-22 DIAGNOSIS — Z8616 Personal history of COVID-19: Secondary | ICD-10-CM | POA: Diagnosis not present

## 2021-11-22 DIAGNOSIS — E785 Hyperlipidemia, unspecified: Secondary | ICD-10-CM | POA: Diagnosis not present

## 2021-11-22 DIAGNOSIS — F32A Depression, unspecified: Secondary | ICD-10-CM | POA: Diagnosis not present

## 2021-11-22 DIAGNOSIS — H4010X0 Unspecified open-angle glaucoma, stage unspecified: Secondary | ICD-10-CM | POA: Diagnosis not present

## 2021-11-22 DIAGNOSIS — K219 Gastro-esophageal reflux disease without esophagitis: Secondary | ICD-10-CM | POA: Diagnosis not present

## 2021-11-22 DIAGNOSIS — C189 Malignant neoplasm of colon, unspecified: Secondary | ICD-10-CM | POA: Diagnosis not present

## 2021-11-22 DIAGNOSIS — D500 Iron deficiency anemia secondary to blood loss (chronic): Secondary | ICD-10-CM | POA: Diagnosis not present

## 2021-11-22 DIAGNOSIS — C787 Secondary malignant neoplasm of liver and intrahepatic bile duct: Secondary | ICD-10-CM | POA: Diagnosis not present

## 2021-11-22 DIAGNOSIS — M810 Age-related osteoporosis without current pathological fracture: Secondary | ICD-10-CM | POA: Diagnosis not present

## 2021-11-22 DIAGNOSIS — Z86711 Personal history of pulmonary embolism: Secondary | ICD-10-CM | POA: Diagnosis not present

## 2021-11-22 DIAGNOSIS — M1990 Unspecified osteoarthritis, unspecified site: Secondary | ICD-10-CM | POA: Diagnosis not present

## 2021-11-22 DIAGNOSIS — Z6830 Body mass index (BMI) 30.0-30.9, adult: Secondary | ICD-10-CM | POA: Diagnosis not present

## 2021-11-22 DIAGNOSIS — I10 Essential (primary) hypertension: Secondary | ICD-10-CM | POA: Diagnosis not present

## 2021-11-22 DIAGNOSIS — Z8709 Personal history of other diseases of the respiratory system: Secondary | ICD-10-CM | POA: Diagnosis not present

## 2021-11-22 DIAGNOSIS — J309 Allergic rhinitis, unspecified: Secondary | ICD-10-CM | POA: Diagnosis not present

## 2021-11-23 DIAGNOSIS — E785 Hyperlipidemia, unspecified: Secondary | ICD-10-CM | POA: Diagnosis not present

## 2021-11-23 DIAGNOSIS — C787 Secondary malignant neoplasm of liver and intrahepatic bile duct: Secondary | ICD-10-CM | POA: Diagnosis not present

## 2021-11-23 DIAGNOSIS — I10 Essential (primary) hypertension: Secondary | ICD-10-CM | POA: Diagnosis not present

## 2021-11-23 DIAGNOSIS — K219 Gastro-esophageal reflux disease without esophagitis: Secondary | ICD-10-CM | POA: Diagnosis not present

## 2021-11-23 DIAGNOSIS — D500 Iron deficiency anemia secondary to blood loss (chronic): Secondary | ICD-10-CM | POA: Diagnosis not present

## 2021-11-23 DIAGNOSIS — C189 Malignant neoplasm of colon, unspecified: Secondary | ICD-10-CM | POA: Diagnosis not present

## 2021-11-25 DIAGNOSIS — C787 Secondary malignant neoplasm of liver and intrahepatic bile duct: Secondary | ICD-10-CM | POA: Diagnosis not present

## 2021-11-25 DIAGNOSIS — I10 Essential (primary) hypertension: Secondary | ICD-10-CM | POA: Diagnosis not present

## 2021-11-25 DIAGNOSIS — D500 Iron deficiency anemia secondary to blood loss (chronic): Secondary | ICD-10-CM | POA: Diagnosis not present

## 2021-11-25 DIAGNOSIS — E785 Hyperlipidemia, unspecified: Secondary | ICD-10-CM | POA: Diagnosis not present

## 2021-11-25 DIAGNOSIS — K219 Gastro-esophageal reflux disease without esophagitis: Secondary | ICD-10-CM | POA: Diagnosis not present

## 2021-11-25 DIAGNOSIS — C189 Malignant neoplasm of colon, unspecified: Secondary | ICD-10-CM | POA: Diagnosis not present

## 2021-11-27 ENCOUNTER — Ambulatory Visit: Payer: BLUE CROSS/BLUE SHIELD | Admitting: Interventional Cardiology

## 2021-11-27 DIAGNOSIS — C189 Malignant neoplasm of colon, unspecified: Secondary | ICD-10-CM | POA: Diagnosis not present

## 2021-11-27 DIAGNOSIS — I10 Essential (primary) hypertension: Secondary | ICD-10-CM | POA: Diagnosis not present

## 2021-11-27 DIAGNOSIS — C787 Secondary malignant neoplasm of liver and intrahepatic bile duct: Secondary | ICD-10-CM | POA: Diagnosis not present

## 2021-11-27 DIAGNOSIS — E785 Hyperlipidemia, unspecified: Secondary | ICD-10-CM | POA: Diagnosis not present

## 2021-11-27 DIAGNOSIS — K219 Gastro-esophageal reflux disease without esophagitis: Secondary | ICD-10-CM | POA: Diagnosis not present

## 2021-11-27 DIAGNOSIS — D500 Iron deficiency anemia secondary to blood loss (chronic): Secondary | ICD-10-CM | POA: Diagnosis not present

## 2021-11-30 DIAGNOSIS — I10 Essential (primary) hypertension: Secondary | ICD-10-CM | POA: Diagnosis not present

## 2021-11-30 DIAGNOSIS — E785 Hyperlipidemia, unspecified: Secondary | ICD-10-CM | POA: Diagnosis not present

## 2021-11-30 DIAGNOSIS — C787 Secondary malignant neoplasm of liver and intrahepatic bile duct: Secondary | ICD-10-CM | POA: Diagnosis not present

## 2021-11-30 DIAGNOSIS — C189 Malignant neoplasm of colon, unspecified: Secondary | ICD-10-CM | POA: Diagnosis not present

## 2021-11-30 DIAGNOSIS — K219 Gastro-esophageal reflux disease without esophagitis: Secondary | ICD-10-CM | POA: Diagnosis not present

## 2021-11-30 DIAGNOSIS — D500 Iron deficiency anemia secondary to blood loss (chronic): Secondary | ICD-10-CM | POA: Diagnosis not present

## 2021-12-01 DIAGNOSIS — C189 Malignant neoplasm of colon, unspecified: Secondary | ICD-10-CM | POA: Diagnosis not present

## 2021-12-01 DIAGNOSIS — D500 Iron deficiency anemia secondary to blood loss (chronic): Secondary | ICD-10-CM | POA: Diagnosis not present

## 2021-12-01 DIAGNOSIS — C787 Secondary malignant neoplasm of liver and intrahepatic bile duct: Secondary | ICD-10-CM | POA: Diagnosis not present

## 2021-12-01 DIAGNOSIS — E785 Hyperlipidemia, unspecified: Secondary | ICD-10-CM | POA: Diagnosis not present

## 2021-12-01 DIAGNOSIS — I10 Essential (primary) hypertension: Secondary | ICD-10-CM | POA: Diagnosis not present

## 2021-12-01 DIAGNOSIS — K219 Gastro-esophageal reflux disease without esophagitis: Secondary | ICD-10-CM | POA: Diagnosis not present

## 2021-12-02 DIAGNOSIS — K219 Gastro-esophageal reflux disease without esophagitis: Secondary | ICD-10-CM | POA: Diagnosis not present

## 2021-12-02 DIAGNOSIS — D500 Iron deficiency anemia secondary to blood loss (chronic): Secondary | ICD-10-CM | POA: Diagnosis not present

## 2021-12-02 DIAGNOSIS — I10 Essential (primary) hypertension: Secondary | ICD-10-CM | POA: Diagnosis not present

## 2021-12-02 DIAGNOSIS — C189 Malignant neoplasm of colon, unspecified: Secondary | ICD-10-CM | POA: Diagnosis not present

## 2021-12-02 DIAGNOSIS — C787 Secondary malignant neoplasm of liver and intrahepatic bile duct: Secondary | ICD-10-CM | POA: Diagnosis not present

## 2021-12-02 DIAGNOSIS — E785 Hyperlipidemia, unspecified: Secondary | ICD-10-CM | POA: Diagnosis not present

## 2021-12-04 DIAGNOSIS — E785 Hyperlipidemia, unspecified: Secondary | ICD-10-CM | POA: Diagnosis not present

## 2021-12-04 DIAGNOSIS — M179 Osteoarthritis of knee, unspecified: Secondary | ICD-10-CM | POA: Diagnosis not present

## 2021-12-04 DIAGNOSIS — I2609 Other pulmonary embolism with acute cor pulmonale: Secondary | ICD-10-CM | POA: Diagnosis not present

## 2021-12-04 DIAGNOSIS — D649 Anemia, unspecified: Secondary | ICD-10-CM | POA: Diagnosis not present

## 2021-12-04 DIAGNOSIS — I10 Essential (primary) hypertension: Secondary | ICD-10-CM | POA: Diagnosis not present

## 2021-12-04 DIAGNOSIS — R269 Unspecified abnormalities of gait and mobility: Secondary | ICD-10-CM | POA: Diagnosis not present

## 2021-12-04 DIAGNOSIS — C787 Secondary malignant neoplasm of liver and intrahepatic bile duct: Secondary | ICD-10-CM | POA: Diagnosis not present

## 2021-12-04 DIAGNOSIS — J302 Other seasonal allergic rhinitis: Secondary | ICD-10-CM | POA: Diagnosis not present

## 2021-12-04 DIAGNOSIS — F329 Major depressive disorder, single episode, unspecified: Secondary | ICD-10-CM | POA: Diagnosis not present

## 2021-12-04 DIAGNOSIS — M8580 Other specified disorders of bone density and structure, unspecified site: Secondary | ICD-10-CM | POA: Diagnosis not present

## 2021-12-04 DIAGNOSIS — C189 Malignant neoplasm of colon, unspecified: Secondary | ICD-10-CM | POA: Diagnosis not present

## 2021-12-04 DIAGNOSIS — I872 Venous insufficiency (chronic) (peripheral): Secondary | ICD-10-CM | POA: Diagnosis not present

## 2021-12-04 DIAGNOSIS — D500 Iron deficiency anemia secondary to blood loss (chronic): Secondary | ICD-10-CM | POA: Diagnosis not present

## 2021-12-04 DIAGNOSIS — K219 Gastro-esophageal reflux disease without esophagitis: Secondary | ICD-10-CM | POA: Diagnosis not present

## 2021-12-04 DIAGNOSIS — D860 Sarcoidosis of lung: Secondary | ICD-10-CM | POA: Diagnosis not present

## 2021-12-07 DIAGNOSIS — K219 Gastro-esophageal reflux disease without esophagitis: Secondary | ICD-10-CM | POA: Diagnosis not present

## 2021-12-07 DIAGNOSIS — D500 Iron deficiency anemia secondary to blood loss (chronic): Secondary | ICD-10-CM | POA: Diagnosis not present

## 2021-12-07 DIAGNOSIS — C189 Malignant neoplasm of colon, unspecified: Secondary | ICD-10-CM | POA: Diagnosis not present

## 2021-12-07 DIAGNOSIS — I10 Essential (primary) hypertension: Secondary | ICD-10-CM | POA: Diagnosis not present

## 2021-12-07 DIAGNOSIS — C787 Secondary malignant neoplasm of liver and intrahepatic bile duct: Secondary | ICD-10-CM | POA: Diagnosis not present

## 2021-12-07 DIAGNOSIS — E785 Hyperlipidemia, unspecified: Secondary | ICD-10-CM | POA: Diagnosis not present

## 2021-12-08 DIAGNOSIS — E785 Hyperlipidemia, unspecified: Secondary | ICD-10-CM | POA: Diagnosis not present

## 2021-12-08 DIAGNOSIS — D500 Iron deficiency anemia secondary to blood loss (chronic): Secondary | ICD-10-CM | POA: Diagnosis not present

## 2021-12-08 DIAGNOSIS — K219 Gastro-esophageal reflux disease without esophagitis: Secondary | ICD-10-CM | POA: Diagnosis not present

## 2021-12-08 DIAGNOSIS — I10 Essential (primary) hypertension: Secondary | ICD-10-CM | POA: Diagnosis not present

## 2021-12-08 DIAGNOSIS — C189 Malignant neoplasm of colon, unspecified: Secondary | ICD-10-CM | POA: Diagnosis not present

## 2021-12-08 DIAGNOSIS — C787 Secondary malignant neoplasm of liver and intrahepatic bile duct: Secondary | ICD-10-CM | POA: Diagnosis not present

## 2021-12-09 DIAGNOSIS — C787 Secondary malignant neoplasm of liver and intrahepatic bile duct: Secondary | ICD-10-CM | POA: Diagnosis not present

## 2021-12-09 DIAGNOSIS — C189 Malignant neoplasm of colon, unspecified: Secondary | ICD-10-CM | POA: Diagnosis not present

## 2021-12-09 DIAGNOSIS — K219 Gastro-esophageal reflux disease without esophagitis: Secondary | ICD-10-CM | POA: Diagnosis not present

## 2021-12-09 DIAGNOSIS — D500 Iron deficiency anemia secondary to blood loss (chronic): Secondary | ICD-10-CM | POA: Diagnosis not present

## 2021-12-09 DIAGNOSIS — E785 Hyperlipidemia, unspecified: Secondary | ICD-10-CM | POA: Diagnosis not present

## 2021-12-09 DIAGNOSIS — I10 Essential (primary) hypertension: Secondary | ICD-10-CM | POA: Diagnosis not present

## 2021-12-11 ENCOUNTER — Other Ambulatory Visit: Payer: Self-pay

## 2021-12-11 DIAGNOSIS — C189 Malignant neoplasm of colon, unspecified: Secondary | ICD-10-CM | POA: Diagnosis not present

## 2021-12-11 DIAGNOSIS — D500 Iron deficiency anemia secondary to blood loss (chronic): Secondary | ICD-10-CM | POA: Diagnosis not present

## 2021-12-11 DIAGNOSIS — K219 Gastro-esophageal reflux disease without esophagitis: Secondary | ICD-10-CM | POA: Diagnosis not present

## 2021-12-11 DIAGNOSIS — I10 Essential (primary) hypertension: Secondary | ICD-10-CM | POA: Diagnosis not present

## 2021-12-11 DIAGNOSIS — C787 Secondary malignant neoplasm of liver and intrahepatic bile duct: Secondary | ICD-10-CM | POA: Diagnosis not present

## 2021-12-11 DIAGNOSIS — E785 Hyperlipidemia, unspecified: Secondary | ICD-10-CM | POA: Diagnosis not present

## 2021-12-11 MED ORDER — AMOXICILLIN-POT CLAVULANATE 875-125 MG PO TABS: 1.0000 | ORAL_TABLET | Freq: Two times a day (BID) | ORAL | 0 refills | Status: AC

## 2021-12-11 NOTE — Progress Notes (Signed)
[facility] RN called with report that pt has a cough x1 wk with thick yellow phlegm.  RN reported pt is taking Robitussin but is not helping with the pt's cough.  Spoke with Dr. Burr Medico regarding [facility]'s call and Dr. Burr Medico prescribed Augmentin and recommends pt continue to take Robitussin DM.  Prescription sent [place], [place].

## 2021-12-14 DIAGNOSIS — C787 Secondary malignant neoplasm of liver and intrahepatic bile duct: Secondary | ICD-10-CM | POA: Diagnosis not present

## 2021-12-14 DIAGNOSIS — K219 Gastro-esophageal reflux disease without esophagitis: Secondary | ICD-10-CM | POA: Diagnosis not present

## 2021-12-14 DIAGNOSIS — E785 Hyperlipidemia, unspecified: Secondary | ICD-10-CM | POA: Diagnosis not present

## 2021-12-14 DIAGNOSIS — I10 Essential (primary) hypertension: Secondary | ICD-10-CM | POA: Diagnosis not present

## 2021-12-14 DIAGNOSIS — C189 Malignant neoplasm of colon, unspecified: Secondary | ICD-10-CM | POA: Diagnosis not present

## 2021-12-14 DIAGNOSIS — D500 Iron deficiency anemia secondary to blood loss (chronic): Secondary | ICD-10-CM | POA: Diagnosis not present

## 2021-12-15 DIAGNOSIS — D500 Iron deficiency anemia secondary to blood loss (chronic): Secondary | ICD-10-CM | POA: Diagnosis not present

## 2021-12-15 DIAGNOSIS — K219 Gastro-esophageal reflux disease without esophagitis: Secondary | ICD-10-CM | POA: Diagnosis not present

## 2021-12-15 DIAGNOSIS — E785 Hyperlipidemia, unspecified: Secondary | ICD-10-CM | POA: Diagnosis not present

## 2021-12-15 DIAGNOSIS — I10 Essential (primary) hypertension: Secondary | ICD-10-CM | POA: Diagnosis not present

## 2021-12-15 DIAGNOSIS — C189 Malignant neoplasm of colon, unspecified: Secondary | ICD-10-CM | POA: Diagnosis not present

## 2021-12-15 DIAGNOSIS — C787 Secondary malignant neoplasm of liver and intrahepatic bile duct: Secondary | ICD-10-CM | POA: Diagnosis not present

## 2021-12-16 DIAGNOSIS — I10 Essential (primary) hypertension: Secondary | ICD-10-CM | POA: Diagnosis not present

## 2021-12-16 DIAGNOSIS — K219 Gastro-esophageal reflux disease without esophagitis: Secondary | ICD-10-CM | POA: Diagnosis not present

## 2021-12-16 DIAGNOSIS — E785 Hyperlipidemia, unspecified: Secondary | ICD-10-CM | POA: Diagnosis not present

## 2021-12-16 DIAGNOSIS — D500 Iron deficiency anemia secondary to blood loss (chronic): Secondary | ICD-10-CM | POA: Diagnosis not present

## 2021-12-16 DIAGNOSIS — C189 Malignant neoplasm of colon, unspecified: Secondary | ICD-10-CM | POA: Diagnosis not present

## 2021-12-16 DIAGNOSIS — C787 Secondary malignant neoplasm of liver and intrahepatic bile duct: Secondary | ICD-10-CM | POA: Diagnosis not present

## 2021-12-17 DIAGNOSIS — C189 Malignant neoplasm of colon, unspecified: Secondary | ICD-10-CM | POA: Diagnosis not present

## 2021-12-17 DIAGNOSIS — D500 Iron deficiency anemia secondary to blood loss (chronic): Secondary | ICD-10-CM | POA: Diagnosis not present

## 2021-12-17 DIAGNOSIS — K219 Gastro-esophageal reflux disease without esophagitis: Secondary | ICD-10-CM | POA: Diagnosis not present

## 2021-12-17 DIAGNOSIS — I10 Essential (primary) hypertension: Secondary | ICD-10-CM | POA: Diagnosis not present

## 2021-12-17 DIAGNOSIS — C787 Secondary malignant neoplasm of liver and intrahepatic bile duct: Secondary | ICD-10-CM | POA: Diagnosis not present

## 2021-12-17 DIAGNOSIS — E785 Hyperlipidemia, unspecified: Secondary | ICD-10-CM | POA: Diagnosis not present

## 2021-12-23 DIAGNOSIS — E785 Hyperlipidemia, unspecified: Secondary | ICD-10-CM | POA: Diagnosis not present

## 2021-12-23 DIAGNOSIS — K219 Gastro-esophageal reflux disease without esophagitis: Secondary | ICD-10-CM | POA: Diagnosis not present

## 2021-12-23 DIAGNOSIS — F32A Depression, unspecified: Secondary | ICD-10-CM | POA: Diagnosis not present

## 2021-12-23 DIAGNOSIS — R32 Unspecified urinary incontinence: Secondary | ICD-10-CM | POA: Diagnosis not present

## 2021-12-23 DIAGNOSIS — R159 Full incontinence of feces: Secondary | ICD-10-CM | POA: Diagnosis not present

## 2021-12-23 DIAGNOSIS — Z8616 Personal history of COVID-19: Secondary | ICD-10-CM | POA: Diagnosis not present

## 2021-12-23 DIAGNOSIS — C189 Malignant neoplasm of colon, unspecified: Secondary | ICD-10-CM | POA: Diagnosis not present

## 2021-12-23 DIAGNOSIS — Z8709 Personal history of other diseases of the respiratory system: Secondary | ICD-10-CM | POA: Diagnosis not present

## 2021-12-23 DIAGNOSIS — Z741 Need for assistance with personal care: Secondary | ICD-10-CM | POA: Diagnosis not present

## 2021-12-23 DIAGNOSIS — C787 Secondary malignant neoplasm of liver and intrahepatic bile duct: Secondary | ICD-10-CM | POA: Diagnosis not present

## 2021-12-23 DIAGNOSIS — H4010X0 Unspecified open-angle glaucoma, stage unspecified: Secondary | ICD-10-CM | POA: Diagnosis not present

## 2021-12-23 DIAGNOSIS — D500 Iron deficiency anemia secondary to blood loss (chronic): Secondary | ICD-10-CM | POA: Diagnosis not present

## 2021-12-23 DIAGNOSIS — I10 Essential (primary) hypertension: Secondary | ICD-10-CM | POA: Diagnosis not present

## 2021-12-23 DIAGNOSIS — M1990 Unspecified osteoarthritis, unspecified site: Secondary | ICD-10-CM | POA: Diagnosis not present

## 2021-12-23 DIAGNOSIS — Z86711 Personal history of pulmonary embolism: Secondary | ICD-10-CM | POA: Diagnosis not present

## 2021-12-23 DIAGNOSIS — M810 Age-related osteoporosis without current pathological fracture: Secondary | ICD-10-CM | POA: Diagnosis not present

## 2021-12-23 DIAGNOSIS — Z6828 Body mass index (BMI) 28.0-28.9, adult: Secondary | ICD-10-CM | POA: Diagnosis not present

## 2021-12-23 DIAGNOSIS — J309 Allergic rhinitis, unspecified: Secondary | ICD-10-CM | POA: Diagnosis not present

## 2021-12-24 DIAGNOSIS — K219 Gastro-esophageal reflux disease without esophagitis: Secondary | ICD-10-CM | POA: Diagnosis not present

## 2021-12-24 DIAGNOSIS — I10 Essential (primary) hypertension: Secondary | ICD-10-CM | POA: Diagnosis not present

## 2021-12-24 DIAGNOSIS — C189 Malignant neoplasm of colon, unspecified: Secondary | ICD-10-CM | POA: Diagnosis not present

## 2021-12-24 DIAGNOSIS — E785 Hyperlipidemia, unspecified: Secondary | ICD-10-CM | POA: Diagnosis not present

## 2021-12-24 DIAGNOSIS — D500 Iron deficiency anemia secondary to blood loss (chronic): Secondary | ICD-10-CM | POA: Diagnosis not present

## 2021-12-24 DIAGNOSIS — C787 Secondary malignant neoplasm of liver and intrahepatic bile duct: Secondary | ICD-10-CM | POA: Diagnosis not present

## 2021-12-25 DIAGNOSIS — D500 Iron deficiency anemia secondary to blood loss (chronic): Secondary | ICD-10-CM | POA: Diagnosis not present

## 2021-12-25 DIAGNOSIS — K219 Gastro-esophageal reflux disease without esophagitis: Secondary | ICD-10-CM | POA: Diagnosis not present

## 2021-12-25 DIAGNOSIS — C189 Malignant neoplasm of colon, unspecified: Secondary | ICD-10-CM | POA: Diagnosis not present

## 2021-12-25 DIAGNOSIS — C787 Secondary malignant neoplasm of liver and intrahepatic bile duct: Secondary | ICD-10-CM | POA: Diagnosis not present

## 2021-12-25 DIAGNOSIS — I10 Essential (primary) hypertension: Secondary | ICD-10-CM | POA: Diagnosis not present

## 2021-12-25 DIAGNOSIS — E785 Hyperlipidemia, unspecified: Secondary | ICD-10-CM | POA: Diagnosis not present

## 2021-12-28 ENCOUNTER — Other Ambulatory Visit: Payer: Self-pay | Admitting: Hematology

## 2021-12-28 ENCOUNTER — Other Ambulatory Visit: Payer: Self-pay

## 2021-12-28 DIAGNOSIS — E785 Hyperlipidemia, unspecified: Secondary | ICD-10-CM | POA: Diagnosis not present

## 2021-12-28 DIAGNOSIS — K219 Gastro-esophageal reflux disease without esophagitis: Secondary | ICD-10-CM | POA: Diagnosis not present

## 2021-12-28 DIAGNOSIS — C189 Malignant neoplasm of colon, unspecified: Secondary | ICD-10-CM | POA: Diagnosis not present

## 2021-12-28 DIAGNOSIS — D500 Iron deficiency anemia secondary to blood loss (chronic): Secondary | ICD-10-CM | POA: Diagnosis not present

## 2021-12-28 DIAGNOSIS — I10 Essential (primary) hypertension: Secondary | ICD-10-CM | POA: Diagnosis not present

## 2021-12-28 DIAGNOSIS — C787 Secondary malignant neoplasm of liver and intrahepatic bile duct: Secondary | ICD-10-CM | POA: Diagnosis not present

## 2021-12-28 MED ORDER — MORPHINE SULFATE 15 MG PO TABS: 7.5000 mg | ORAL_TABLET | Freq: Four times a day (QID) | ORAL | 0 refills | Status: AC | PRN

## 2021-12-29 DIAGNOSIS — C189 Malignant neoplasm of colon, unspecified: Secondary | ICD-10-CM | POA: Diagnosis not present

## 2021-12-29 DIAGNOSIS — E785 Hyperlipidemia, unspecified: Secondary | ICD-10-CM | POA: Diagnosis not present

## 2021-12-29 DIAGNOSIS — K219 Gastro-esophageal reflux disease without esophagitis: Secondary | ICD-10-CM | POA: Diagnosis not present

## 2021-12-29 DIAGNOSIS — C787 Secondary malignant neoplasm of liver and intrahepatic bile duct: Secondary | ICD-10-CM | POA: Diagnosis not present

## 2021-12-29 DIAGNOSIS — D500 Iron deficiency anemia secondary to blood loss (chronic): Secondary | ICD-10-CM | POA: Diagnosis not present

## 2021-12-29 DIAGNOSIS — I10 Essential (primary) hypertension: Secondary | ICD-10-CM | POA: Diagnosis not present

## 2021-12-30 DIAGNOSIS — C189 Malignant neoplasm of colon, unspecified: Secondary | ICD-10-CM | POA: Diagnosis not present

## 2021-12-30 DIAGNOSIS — D500 Iron deficiency anemia secondary to blood loss (chronic): Secondary | ICD-10-CM | POA: Diagnosis not present

## 2021-12-30 DIAGNOSIS — C787 Secondary malignant neoplasm of liver and intrahepatic bile duct: Secondary | ICD-10-CM | POA: Diagnosis not present

## 2021-12-30 DIAGNOSIS — K219 Gastro-esophageal reflux disease without esophagitis: Secondary | ICD-10-CM | POA: Diagnosis not present

## 2021-12-30 DIAGNOSIS — E785 Hyperlipidemia, unspecified: Secondary | ICD-10-CM | POA: Diagnosis not present

## 2021-12-30 DIAGNOSIS — I10 Essential (primary) hypertension: Secondary | ICD-10-CM | POA: Diagnosis not present

## 2021-12-31 DIAGNOSIS — K219 Gastro-esophageal reflux disease without esophagitis: Secondary | ICD-10-CM | POA: Diagnosis not present

## 2021-12-31 DIAGNOSIS — E785 Hyperlipidemia, unspecified: Secondary | ICD-10-CM | POA: Diagnosis not present

## 2021-12-31 DIAGNOSIS — I10 Essential (primary) hypertension: Secondary | ICD-10-CM | POA: Diagnosis not present

## 2021-12-31 DIAGNOSIS — D500 Iron deficiency anemia secondary to blood loss (chronic): Secondary | ICD-10-CM | POA: Diagnosis not present

## 2021-12-31 DIAGNOSIS — C787 Secondary malignant neoplasm of liver and intrahepatic bile duct: Secondary | ICD-10-CM | POA: Diagnosis not present

## 2021-12-31 DIAGNOSIS — C189 Malignant neoplasm of colon, unspecified: Secondary | ICD-10-CM | POA: Diagnosis not present

## 2022-01-03 DIAGNOSIS — I10 Essential (primary) hypertension: Secondary | ICD-10-CM | POA: Diagnosis not present

## 2022-01-03 DIAGNOSIS — C787 Secondary malignant neoplasm of liver and intrahepatic bile duct: Secondary | ICD-10-CM | POA: Diagnosis not present

## 2022-01-03 DIAGNOSIS — C189 Malignant neoplasm of colon, unspecified: Secondary | ICD-10-CM | POA: Diagnosis not present

## 2022-01-03 DIAGNOSIS — K219 Gastro-esophageal reflux disease without esophagitis: Secondary | ICD-10-CM | POA: Diagnosis not present

## 2022-01-03 DIAGNOSIS — E785 Hyperlipidemia, unspecified: Secondary | ICD-10-CM | POA: Diagnosis not present

## 2022-01-03 DIAGNOSIS — D500 Iron deficiency anemia secondary to blood loss (chronic): Secondary | ICD-10-CM | POA: Diagnosis not present

## 2022-01-05 ENCOUNTER — Other Ambulatory Visit: Payer: Self-pay | Admitting: Hematology

## 2022-01-05 ENCOUNTER — Telehealth: Payer: Self-pay

## 2022-01-05 DIAGNOSIS — I10 Essential (primary) hypertension: Secondary | ICD-10-CM | POA: Diagnosis not present

## 2022-01-05 DIAGNOSIS — C189 Malignant neoplasm of colon, unspecified: Secondary | ICD-10-CM | POA: Diagnosis not present

## 2022-01-05 DIAGNOSIS — C787 Secondary malignant neoplasm of liver and intrahepatic bile duct: Secondary | ICD-10-CM | POA: Diagnosis not present

## 2022-01-05 DIAGNOSIS — K219 Gastro-esophageal reflux disease without esophagitis: Secondary | ICD-10-CM | POA: Diagnosis not present

## 2022-01-05 DIAGNOSIS — D500 Iron deficiency anemia secondary to blood loss (chronic): Secondary | ICD-10-CM | POA: Diagnosis not present

## 2022-01-05 DIAGNOSIS — E785 Hyperlipidemia, unspecified: Secondary | ICD-10-CM | POA: Diagnosis not present

## 2022-01-05 MED ORDER — TRAMADOL HCL 50 MG PO TABS: 25.0000 mg | ORAL_TABLET | Freq: Four times a day (QID) | ORAL | 0 refills | Status: DC | PRN

## 2022-01-05 NOTE — Telephone Encounter (Signed)
[facility] RN called stating that the pt now wants to be put back onto Tramadol d/t morphine is not helping her pain.  Pt would like new prescription to be sent to her preferred pharmacy on file.  Notified Dr. Burr Medico of the pt's request for Tramadol.

## 2022-01-06 DIAGNOSIS — I10 Essential (primary) hypertension: Secondary | ICD-10-CM | POA: Diagnosis not present

## 2022-01-06 DIAGNOSIS — D500 Iron deficiency anemia secondary to blood loss (chronic): Secondary | ICD-10-CM | POA: Diagnosis not present

## 2022-01-06 DIAGNOSIS — C787 Secondary malignant neoplasm of liver and intrahepatic bile duct: Secondary | ICD-10-CM | POA: Diagnosis not present

## 2022-01-06 DIAGNOSIS — E785 Hyperlipidemia, unspecified: Secondary | ICD-10-CM | POA: Diagnosis not present

## 2022-01-06 DIAGNOSIS — C189 Malignant neoplasm of colon, unspecified: Secondary | ICD-10-CM | POA: Diagnosis not present

## 2022-01-06 DIAGNOSIS — K219 Gastro-esophageal reflux disease without esophagitis: Secondary | ICD-10-CM | POA: Diagnosis not present

## 2022-01-07 DIAGNOSIS — C787 Secondary malignant neoplasm of liver and intrahepatic bile duct: Secondary | ICD-10-CM | POA: Diagnosis not present

## 2022-01-07 DIAGNOSIS — D500 Iron deficiency anemia secondary to blood loss (chronic): Secondary | ICD-10-CM | POA: Diagnosis not present

## 2022-01-07 DIAGNOSIS — E785 Hyperlipidemia, unspecified: Secondary | ICD-10-CM | POA: Diagnosis not present

## 2022-01-07 DIAGNOSIS — I10 Essential (primary) hypertension: Secondary | ICD-10-CM | POA: Diagnosis not present

## 2022-01-07 DIAGNOSIS — K219 Gastro-esophageal reflux disease without esophagitis: Secondary | ICD-10-CM | POA: Diagnosis not present

## 2022-01-07 DIAGNOSIS — C189 Malignant neoplasm of colon, unspecified: Secondary | ICD-10-CM | POA: Diagnosis not present

## 2022-01-11 DIAGNOSIS — C787 Secondary malignant neoplasm of liver and intrahepatic bile duct: Secondary | ICD-10-CM | POA: Diagnosis not present

## 2022-01-11 DIAGNOSIS — I10 Essential (primary) hypertension: Secondary | ICD-10-CM | POA: Diagnosis not present

## 2022-01-11 DIAGNOSIS — K219 Gastro-esophageal reflux disease without esophagitis: Secondary | ICD-10-CM | POA: Diagnosis not present

## 2022-01-11 DIAGNOSIS — E785 Hyperlipidemia, unspecified: Secondary | ICD-10-CM | POA: Diagnosis not present

## 2022-01-11 DIAGNOSIS — C189 Malignant neoplasm of colon, unspecified: Secondary | ICD-10-CM | POA: Diagnosis not present

## 2022-01-11 DIAGNOSIS — D500 Iron deficiency anemia secondary to blood loss (chronic): Secondary | ICD-10-CM | POA: Diagnosis not present

## 2022-01-13 DIAGNOSIS — C189 Malignant neoplasm of colon, unspecified: Secondary | ICD-10-CM | POA: Diagnosis not present

## 2022-01-13 DIAGNOSIS — D500 Iron deficiency anemia secondary to blood loss (chronic): Secondary | ICD-10-CM | POA: Diagnosis not present

## 2022-01-13 DIAGNOSIS — E785 Hyperlipidemia, unspecified: Secondary | ICD-10-CM | POA: Diagnosis not present

## 2022-01-13 DIAGNOSIS — I10 Essential (primary) hypertension: Secondary | ICD-10-CM | POA: Diagnosis not present

## 2022-01-13 DIAGNOSIS — C787 Secondary malignant neoplasm of liver and intrahepatic bile duct: Secondary | ICD-10-CM | POA: Diagnosis not present

## 2022-01-13 DIAGNOSIS — K219 Gastro-esophageal reflux disease without esophagitis: Secondary | ICD-10-CM | POA: Diagnosis not present

## 2022-01-14 DIAGNOSIS — I10 Essential (primary) hypertension: Secondary | ICD-10-CM | POA: Diagnosis not present

## 2022-01-14 DIAGNOSIS — D500 Iron deficiency anemia secondary to blood loss (chronic): Secondary | ICD-10-CM | POA: Diagnosis not present

## 2022-01-14 DIAGNOSIS — C189 Malignant neoplasm of colon, unspecified: Secondary | ICD-10-CM | POA: Diagnosis not present

## 2022-01-14 DIAGNOSIS — K219 Gastro-esophageal reflux disease without esophagitis: Secondary | ICD-10-CM | POA: Diagnosis not present

## 2022-01-14 DIAGNOSIS — E785 Hyperlipidemia, unspecified: Secondary | ICD-10-CM | POA: Diagnosis not present

## 2022-01-14 DIAGNOSIS — C787 Secondary malignant neoplasm of liver and intrahepatic bile duct: Secondary | ICD-10-CM | POA: Diagnosis not present

## 2022-01-18 DIAGNOSIS — E785 Hyperlipidemia, unspecified: Secondary | ICD-10-CM | POA: Diagnosis not present

## 2022-01-18 DIAGNOSIS — C787 Secondary malignant neoplasm of liver and intrahepatic bile duct: Secondary | ICD-10-CM | POA: Diagnosis not present

## 2022-01-18 DIAGNOSIS — I10 Essential (primary) hypertension: Secondary | ICD-10-CM | POA: Diagnosis not present

## 2022-01-18 DIAGNOSIS — K219 Gastro-esophageal reflux disease without esophagitis: Secondary | ICD-10-CM | POA: Diagnosis not present

## 2022-01-18 DIAGNOSIS — D500 Iron deficiency anemia secondary to blood loss (chronic): Secondary | ICD-10-CM | POA: Diagnosis not present

## 2022-01-18 DIAGNOSIS — C189 Malignant neoplasm of colon, unspecified: Secondary | ICD-10-CM | POA: Diagnosis not present

## 2022-01-19 DIAGNOSIS — C787 Secondary malignant neoplasm of liver and intrahepatic bile duct: Secondary | ICD-10-CM | POA: Diagnosis not present

## 2022-01-19 DIAGNOSIS — C189 Malignant neoplasm of colon, unspecified: Secondary | ICD-10-CM | POA: Diagnosis not present

## 2022-01-19 DIAGNOSIS — E785 Hyperlipidemia, unspecified: Secondary | ICD-10-CM | POA: Diagnosis not present

## 2022-01-19 DIAGNOSIS — K219 Gastro-esophageal reflux disease without esophagitis: Secondary | ICD-10-CM | POA: Diagnosis not present

## 2022-01-19 DIAGNOSIS — I10 Essential (primary) hypertension: Secondary | ICD-10-CM | POA: Diagnosis not present

## 2022-01-19 DIAGNOSIS — D500 Iron deficiency anemia secondary to blood loss (chronic): Secondary | ICD-10-CM | POA: Diagnosis not present

## 2022-01-20 DIAGNOSIS — F32A Depression, unspecified: Secondary | ICD-10-CM | POA: Diagnosis not present

## 2022-01-20 DIAGNOSIS — M810 Age-related osteoporosis without current pathological fracture: Secondary | ICD-10-CM | POA: Diagnosis not present

## 2022-01-20 DIAGNOSIS — M1990 Unspecified osteoarthritis, unspecified site: Secondary | ICD-10-CM | POA: Diagnosis not present

## 2022-01-20 DIAGNOSIS — Z6828 Body mass index (BMI) 28.0-28.9, adult: Secondary | ICD-10-CM | POA: Diagnosis not present

## 2022-01-20 DIAGNOSIS — D500 Iron deficiency anemia secondary to blood loss (chronic): Secondary | ICD-10-CM | POA: Diagnosis not present

## 2022-01-20 DIAGNOSIS — E785 Hyperlipidemia, unspecified: Secondary | ICD-10-CM | POA: Diagnosis not present

## 2022-01-20 DIAGNOSIS — I10 Essential (primary) hypertension: Secondary | ICD-10-CM | POA: Diagnosis not present

## 2022-01-20 DIAGNOSIS — K219 Gastro-esophageal reflux disease without esophagitis: Secondary | ICD-10-CM | POA: Diagnosis not present

## 2022-01-20 DIAGNOSIS — Z741 Need for assistance with personal care: Secondary | ICD-10-CM | POA: Diagnosis not present

## 2022-01-20 DIAGNOSIS — Z86711 Personal history of pulmonary embolism: Secondary | ICD-10-CM | POA: Diagnosis not present

## 2022-01-20 DIAGNOSIS — R159 Full incontinence of feces: Secondary | ICD-10-CM | POA: Diagnosis not present

## 2022-01-20 DIAGNOSIS — J309 Allergic rhinitis, unspecified: Secondary | ICD-10-CM | POA: Diagnosis not present

## 2022-01-20 DIAGNOSIS — R32 Unspecified urinary incontinence: Secondary | ICD-10-CM | POA: Diagnosis not present

## 2022-01-20 DIAGNOSIS — C189 Malignant neoplasm of colon, unspecified: Secondary | ICD-10-CM | POA: Diagnosis not present

## 2022-01-20 DIAGNOSIS — Z8616 Personal history of COVID-19: Secondary | ICD-10-CM | POA: Diagnosis not present

## 2022-01-20 DIAGNOSIS — C787 Secondary malignant neoplasm of liver and intrahepatic bile duct: Secondary | ICD-10-CM | POA: Diagnosis not present

## 2022-01-20 DIAGNOSIS — H4010X0 Unspecified open-angle glaucoma, stage unspecified: Secondary | ICD-10-CM | POA: Diagnosis not present

## 2022-01-20 DIAGNOSIS — Z8709 Personal history of other diseases of the respiratory system: Secondary | ICD-10-CM | POA: Diagnosis not present

## 2022-01-21 DIAGNOSIS — E785 Hyperlipidemia, unspecified: Secondary | ICD-10-CM | POA: Diagnosis not present

## 2022-01-21 DIAGNOSIS — K219 Gastro-esophageal reflux disease without esophagitis: Secondary | ICD-10-CM | POA: Diagnosis not present

## 2022-01-21 DIAGNOSIS — I10 Essential (primary) hypertension: Secondary | ICD-10-CM | POA: Diagnosis not present

## 2022-01-21 DIAGNOSIS — C787 Secondary malignant neoplasm of liver and intrahepatic bile duct: Secondary | ICD-10-CM | POA: Diagnosis not present

## 2022-01-21 DIAGNOSIS — D500 Iron deficiency anemia secondary to blood loss (chronic): Secondary | ICD-10-CM | POA: Diagnosis not present

## 2022-01-21 DIAGNOSIS — C189 Malignant neoplasm of colon, unspecified: Secondary | ICD-10-CM | POA: Diagnosis not present

## 2022-01-25 DIAGNOSIS — I10 Essential (primary) hypertension: Secondary | ICD-10-CM | POA: Diagnosis not present

## 2022-01-25 DIAGNOSIS — C787 Secondary malignant neoplasm of liver and intrahepatic bile duct: Secondary | ICD-10-CM | POA: Diagnosis not present

## 2022-01-25 DIAGNOSIS — E785 Hyperlipidemia, unspecified: Secondary | ICD-10-CM | POA: Diagnosis not present

## 2022-01-25 DIAGNOSIS — K219 Gastro-esophageal reflux disease without esophagitis: Secondary | ICD-10-CM | POA: Diagnosis not present

## 2022-01-25 DIAGNOSIS — D500 Iron deficiency anemia secondary to blood loss (chronic): Secondary | ICD-10-CM | POA: Diagnosis not present

## 2022-01-25 DIAGNOSIS — C189 Malignant neoplasm of colon, unspecified: Secondary | ICD-10-CM | POA: Diagnosis not present

## 2022-01-26 ENCOUNTER — Other Ambulatory Visit: Payer: Self-pay | Admitting: Hematology

## 2022-01-26 ENCOUNTER — Telehealth: Payer: Self-pay

## 2022-01-26 DIAGNOSIS — D500 Iron deficiency anemia secondary to blood loss (chronic): Secondary | ICD-10-CM | POA: Diagnosis not present

## 2022-01-26 DIAGNOSIS — C189 Malignant neoplasm of colon, unspecified: Secondary | ICD-10-CM | POA: Diagnosis not present

## 2022-01-26 DIAGNOSIS — K219 Gastro-esophageal reflux disease without esophagitis: Secondary | ICD-10-CM | POA: Diagnosis not present

## 2022-01-26 DIAGNOSIS — E785 Hyperlipidemia, unspecified: Secondary | ICD-10-CM | POA: Diagnosis not present

## 2022-01-26 DIAGNOSIS — C787 Secondary malignant neoplasm of liver and intrahepatic bile duct: Secondary | ICD-10-CM | POA: Diagnosis not present

## 2022-01-26 DIAGNOSIS — I10 Essential (primary) hypertension: Secondary | ICD-10-CM | POA: Diagnosis not present

## 2022-01-26 MED ORDER — TRAMADOL HCL 50 MG PO TABS: 50.0000 mg | ORAL_TABLET | Freq: Four times a day (QID) | ORAL | 0 refills | Status: AC | PRN

## 2022-01-26 NOTE — Telephone Encounter (Signed)
[facility] nurse called stating pt fell last week and has some increased back pain.  Pt is taking the Tramadol but has had to take it more frequently than prescribed.  Pt would like to know if Dr Burr Medico can increase her Tramadol prescription to '1mg'$  instead of 0.'5mg'$ .  [facility] would like a prescription/order for Tramadol '1mg'$  PO.  Notified Dr. Burr Medico. ?

## 2022-01-27 DIAGNOSIS — C189 Malignant neoplasm of colon, unspecified: Secondary | ICD-10-CM | POA: Diagnosis not present

## 2022-01-27 DIAGNOSIS — K219 Gastro-esophageal reflux disease without esophagitis: Secondary | ICD-10-CM | POA: Diagnosis not present

## 2022-01-27 DIAGNOSIS — D500 Iron deficiency anemia secondary to blood loss (chronic): Secondary | ICD-10-CM | POA: Diagnosis not present

## 2022-01-27 DIAGNOSIS — C787 Secondary malignant neoplasm of liver and intrahepatic bile duct: Secondary | ICD-10-CM | POA: Diagnosis not present

## 2022-01-27 DIAGNOSIS — I10 Essential (primary) hypertension: Secondary | ICD-10-CM | POA: Diagnosis not present

## 2022-01-27 DIAGNOSIS — E785 Hyperlipidemia, unspecified: Secondary | ICD-10-CM | POA: Diagnosis not present

## 2022-01-28 DIAGNOSIS — E785 Hyperlipidemia, unspecified: Secondary | ICD-10-CM | POA: Diagnosis not present

## 2022-01-28 DIAGNOSIS — K219 Gastro-esophageal reflux disease without esophagitis: Secondary | ICD-10-CM | POA: Diagnosis not present

## 2022-01-28 DIAGNOSIS — C787 Secondary malignant neoplasm of liver and intrahepatic bile duct: Secondary | ICD-10-CM | POA: Diagnosis not present

## 2022-01-28 DIAGNOSIS — I10 Essential (primary) hypertension: Secondary | ICD-10-CM | POA: Diagnosis not present

## 2022-01-28 DIAGNOSIS — C189 Malignant neoplasm of colon, unspecified: Secondary | ICD-10-CM | POA: Diagnosis not present

## 2022-01-28 DIAGNOSIS — D500 Iron deficiency anemia secondary to blood loss (chronic): Secondary | ICD-10-CM | POA: Diagnosis not present

## 2022-01-30 DIAGNOSIS — E785 Hyperlipidemia, unspecified: Secondary | ICD-10-CM | POA: Diagnosis not present

## 2022-01-30 DIAGNOSIS — C189 Malignant neoplasm of colon, unspecified: Secondary | ICD-10-CM | POA: Diagnosis not present

## 2022-01-30 DIAGNOSIS — I10 Essential (primary) hypertension: Secondary | ICD-10-CM | POA: Diagnosis not present

## 2022-01-30 DIAGNOSIS — C787 Secondary malignant neoplasm of liver and intrahepatic bile duct: Secondary | ICD-10-CM | POA: Diagnosis not present

## 2022-01-30 DIAGNOSIS — K219 Gastro-esophageal reflux disease without esophagitis: Secondary | ICD-10-CM | POA: Diagnosis not present

## 2022-01-30 DIAGNOSIS — D500 Iron deficiency anemia secondary to blood loss (chronic): Secondary | ICD-10-CM | POA: Diagnosis not present

## 2022-02-01 DIAGNOSIS — I10 Essential (primary) hypertension: Secondary | ICD-10-CM | POA: Diagnosis not present

## 2022-02-01 DIAGNOSIS — D500 Iron deficiency anemia secondary to blood loss (chronic): Secondary | ICD-10-CM | POA: Diagnosis not present

## 2022-02-01 DIAGNOSIS — C189 Malignant neoplasm of colon, unspecified: Secondary | ICD-10-CM | POA: Diagnosis not present

## 2022-02-01 DIAGNOSIS — K219 Gastro-esophageal reflux disease without esophagitis: Secondary | ICD-10-CM | POA: Diagnosis not present

## 2022-02-01 DIAGNOSIS — C787 Secondary malignant neoplasm of liver and intrahepatic bile duct: Secondary | ICD-10-CM | POA: Diagnosis not present

## 2022-02-01 DIAGNOSIS — E785 Hyperlipidemia, unspecified: Secondary | ICD-10-CM | POA: Diagnosis not present

## 2022-02-02 DIAGNOSIS — C787 Secondary malignant neoplasm of liver and intrahepatic bile duct: Secondary | ICD-10-CM | POA: Diagnosis not present

## 2022-02-02 DIAGNOSIS — E785 Hyperlipidemia, unspecified: Secondary | ICD-10-CM | POA: Diagnosis not present

## 2022-02-02 DIAGNOSIS — D500 Iron deficiency anemia secondary to blood loss (chronic): Secondary | ICD-10-CM | POA: Diagnosis not present

## 2022-02-02 DIAGNOSIS — C189 Malignant neoplasm of colon, unspecified: Secondary | ICD-10-CM | POA: Diagnosis not present

## 2022-02-02 DIAGNOSIS — I10 Essential (primary) hypertension: Secondary | ICD-10-CM | POA: Diagnosis not present

## 2022-02-02 DIAGNOSIS — K219 Gastro-esophageal reflux disease without esophagitis: Secondary | ICD-10-CM | POA: Diagnosis not present

## 2022-02-03 DIAGNOSIS — D860 Sarcoidosis of lung: Secondary | ICD-10-CM | POA: Diagnosis not present

## 2022-02-03 DIAGNOSIS — C787 Secondary malignant neoplasm of liver and intrahepatic bile duct: Secondary | ICD-10-CM | POA: Diagnosis not present

## 2022-02-03 DIAGNOSIS — I872 Venous insufficiency (chronic) (peripheral): Secondary | ICD-10-CM | POA: Diagnosis not present

## 2022-02-03 DIAGNOSIS — D649 Anemia, unspecified: Secondary | ICD-10-CM | POA: Diagnosis not present

## 2022-02-03 DIAGNOSIS — M179 Osteoarthritis of knee, unspecified: Secondary | ICD-10-CM | POA: Diagnosis not present

## 2022-02-03 DIAGNOSIS — D500 Iron deficiency anemia secondary to blood loss (chronic): Secondary | ICD-10-CM | POA: Diagnosis not present

## 2022-02-03 DIAGNOSIS — C189 Malignant neoplasm of colon, unspecified: Secondary | ICD-10-CM | POA: Diagnosis not present

## 2022-02-03 DIAGNOSIS — R269 Unspecified abnormalities of gait and mobility: Secondary | ICD-10-CM | POA: Diagnosis not present

## 2022-02-03 DIAGNOSIS — E785 Hyperlipidemia, unspecified: Secondary | ICD-10-CM | POA: Diagnosis not present

## 2022-02-03 DIAGNOSIS — I10 Essential (primary) hypertension: Secondary | ICD-10-CM | POA: Diagnosis not present

## 2022-02-03 DIAGNOSIS — K219 Gastro-esophageal reflux disease without esophagitis: Secondary | ICD-10-CM | POA: Diagnosis not present

## 2022-02-04 DIAGNOSIS — D500 Iron deficiency anemia secondary to blood loss (chronic): Secondary | ICD-10-CM | POA: Diagnosis not present

## 2022-02-04 DIAGNOSIS — E785 Hyperlipidemia, unspecified: Secondary | ICD-10-CM | POA: Diagnosis not present

## 2022-02-04 DIAGNOSIS — K219 Gastro-esophageal reflux disease without esophagitis: Secondary | ICD-10-CM | POA: Diagnosis not present

## 2022-02-04 DIAGNOSIS — C189 Malignant neoplasm of colon, unspecified: Secondary | ICD-10-CM | POA: Diagnosis not present

## 2022-02-04 DIAGNOSIS — C787 Secondary malignant neoplasm of liver and intrahepatic bile duct: Secondary | ICD-10-CM | POA: Diagnosis not present

## 2022-02-04 DIAGNOSIS — I10 Essential (primary) hypertension: Secondary | ICD-10-CM | POA: Diagnosis not present

## 2022-02-08 DIAGNOSIS — E785 Hyperlipidemia, unspecified: Secondary | ICD-10-CM | POA: Diagnosis not present

## 2022-02-08 DIAGNOSIS — D500 Iron deficiency anemia secondary to blood loss (chronic): Secondary | ICD-10-CM | POA: Diagnosis not present

## 2022-02-08 DIAGNOSIS — C787 Secondary malignant neoplasm of liver and intrahepatic bile duct: Secondary | ICD-10-CM | POA: Diagnosis not present

## 2022-02-08 DIAGNOSIS — I10 Essential (primary) hypertension: Secondary | ICD-10-CM | POA: Diagnosis not present

## 2022-02-08 DIAGNOSIS — K219 Gastro-esophageal reflux disease without esophagitis: Secondary | ICD-10-CM | POA: Diagnosis not present

## 2022-02-08 DIAGNOSIS — C189 Malignant neoplasm of colon, unspecified: Secondary | ICD-10-CM | POA: Diagnosis not present

## 2022-02-09 DIAGNOSIS — C787 Secondary malignant neoplasm of liver and intrahepatic bile duct: Secondary | ICD-10-CM | POA: Diagnosis not present

## 2022-02-09 DIAGNOSIS — D500 Iron deficiency anemia secondary to blood loss (chronic): Secondary | ICD-10-CM | POA: Diagnosis not present

## 2022-02-09 DIAGNOSIS — E785 Hyperlipidemia, unspecified: Secondary | ICD-10-CM | POA: Diagnosis not present

## 2022-02-09 DIAGNOSIS — K219 Gastro-esophageal reflux disease without esophagitis: Secondary | ICD-10-CM | POA: Diagnosis not present

## 2022-02-09 DIAGNOSIS — C189 Malignant neoplasm of colon, unspecified: Secondary | ICD-10-CM | POA: Diagnosis not present

## 2022-02-09 DIAGNOSIS — I10 Essential (primary) hypertension: Secondary | ICD-10-CM | POA: Diagnosis not present

## 2022-02-10 DIAGNOSIS — E785 Hyperlipidemia, unspecified: Secondary | ICD-10-CM | POA: Diagnosis not present

## 2022-02-10 DIAGNOSIS — C787 Secondary malignant neoplasm of liver and intrahepatic bile duct: Secondary | ICD-10-CM | POA: Diagnosis not present

## 2022-02-10 DIAGNOSIS — I10 Essential (primary) hypertension: Secondary | ICD-10-CM | POA: Diagnosis not present

## 2022-02-10 DIAGNOSIS — D500 Iron deficiency anemia secondary to blood loss (chronic): Secondary | ICD-10-CM | POA: Diagnosis not present

## 2022-02-10 DIAGNOSIS — C189 Malignant neoplasm of colon, unspecified: Secondary | ICD-10-CM | POA: Diagnosis not present

## 2022-02-10 DIAGNOSIS — K219 Gastro-esophageal reflux disease without esophagitis: Secondary | ICD-10-CM | POA: Diagnosis not present

## 2022-02-11 DIAGNOSIS — E785 Hyperlipidemia, unspecified: Secondary | ICD-10-CM | POA: Diagnosis not present

## 2022-02-11 DIAGNOSIS — C787 Secondary malignant neoplasm of liver and intrahepatic bile duct: Secondary | ICD-10-CM | POA: Diagnosis not present

## 2022-02-11 DIAGNOSIS — C189 Malignant neoplasm of colon, unspecified: Secondary | ICD-10-CM | POA: Diagnosis not present

## 2022-02-11 DIAGNOSIS — I10 Essential (primary) hypertension: Secondary | ICD-10-CM | POA: Diagnosis not present

## 2022-02-11 DIAGNOSIS — D500 Iron deficiency anemia secondary to blood loss (chronic): Secondary | ICD-10-CM | POA: Diagnosis not present

## 2022-02-11 DIAGNOSIS — K219 Gastro-esophageal reflux disease without esophagitis: Secondary | ICD-10-CM | POA: Diagnosis not present

## 2022-02-15 DIAGNOSIS — D500 Iron deficiency anemia secondary to blood loss (chronic): Secondary | ICD-10-CM | POA: Diagnosis not present

## 2022-02-15 DIAGNOSIS — C787 Secondary malignant neoplasm of liver and intrahepatic bile duct: Secondary | ICD-10-CM | POA: Diagnosis not present

## 2022-02-15 DIAGNOSIS — K219 Gastro-esophageal reflux disease without esophagitis: Secondary | ICD-10-CM | POA: Diagnosis not present

## 2022-02-15 DIAGNOSIS — I10 Essential (primary) hypertension: Secondary | ICD-10-CM | POA: Diagnosis not present

## 2022-02-15 DIAGNOSIS — E785 Hyperlipidemia, unspecified: Secondary | ICD-10-CM | POA: Diagnosis not present

## 2022-02-15 DIAGNOSIS — C189 Malignant neoplasm of colon, unspecified: Secondary | ICD-10-CM | POA: Diagnosis not present

## 2022-02-17 DIAGNOSIS — K219 Gastro-esophageal reflux disease without esophagitis: Secondary | ICD-10-CM | POA: Diagnosis not present

## 2022-02-17 DIAGNOSIS — E785 Hyperlipidemia, unspecified: Secondary | ICD-10-CM | POA: Diagnosis not present

## 2022-02-17 DIAGNOSIS — C787 Secondary malignant neoplasm of liver and intrahepatic bile duct: Secondary | ICD-10-CM | POA: Diagnosis not present

## 2022-02-17 DIAGNOSIS — I10 Essential (primary) hypertension: Secondary | ICD-10-CM | POA: Diagnosis not present

## 2022-02-17 DIAGNOSIS — C189 Malignant neoplasm of colon, unspecified: Secondary | ICD-10-CM | POA: Diagnosis not present

## 2022-02-17 DIAGNOSIS — D500 Iron deficiency anemia secondary to blood loss (chronic): Secondary | ICD-10-CM | POA: Diagnosis not present

## 2022-02-18 DIAGNOSIS — K219 Gastro-esophageal reflux disease without esophagitis: Secondary | ICD-10-CM | POA: Diagnosis not present

## 2022-02-18 DIAGNOSIS — E785 Hyperlipidemia, unspecified: Secondary | ICD-10-CM | POA: Diagnosis not present

## 2022-02-18 DIAGNOSIS — D500 Iron deficiency anemia secondary to blood loss (chronic): Secondary | ICD-10-CM | POA: Diagnosis not present

## 2022-02-18 DIAGNOSIS — I10 Essential (primary) hypertension: Secondary | ICD-10-CM | POA: Diagnosis not present

## 2022-02-18 DIAGNOSIS — C787 Secondary malignant neoplasm of liver and intrahepatic bile duct: Secondary | ICD-10-CM | POA: Diagnosis not present

## 2022-02-18 DIAGNOSIS — C189 Malignant neoplasm of colon, unspecified: Secondary | ICD-10-CM | POA: Diagnosis not present

## 2022-02-19 DIAGNOSIS — K219 Gastro-esophageal reflux disease without esophagitis: Secondary | ICD-10-CM | POA: Diagnosis not present

## 2022-02-19 DIAGNOSIS — C787 Secondary malignant neoplasm of liver and intrahepatic bile duct: Secondary | ICD-10-CM | POA: Diagnosis not present

## 2022-02-19 DIAGNOSIS — E785 Hyperlipidemia, unspecified: Secondary | ICD-10-CM | POA: Diagnosis not present

## 2022-02-19 DIAGNOSIS — I10 Essential (primary) hypertension: Secondary | ICD-10-CM | POA: Diagnosis not present

## 2022-02-19 DIAGNOSIS — C189 Malignant neoplasm of colon, unspecified: Secondary | ICD-10-CM | POA: Diagnosis not present

## 2022-02-19 DIAGNOSIS — D500 Iron deficiency anemia secondary to blood loss (chronic): Secondary | ICD-10-CM | POA: Diagnosis not present

## 2022-02-20 DIAGNOSIS — Z86711 Personal history of pulmonary embolism: Secondary | ICD-10-CM | POA: Diagnosis not present

## 2022-02-20 DIAGNOSIS — C189 Malignant neoplasm of colon, unspecified: Secondary | ICD-10-CM | POA: Diagnosis not present

## 2022-02-20 DIAGNOSIS — Z741 Need for assistance with personal care: Secondary | ICD-10-CM | POA: Diagnosis not present

## 2022-02-20 DIAGNOSIS — C787 Secondary malignant neoplasm of liver and intrahepatic bile duct: Secondary | ICD-10-CM | POA: Diagnosis not present

## 2022-02-20 DIAGNOSIS — Z6828 Body mass index (BMI) 28.0-28.9, adult: Secondary | ICD-10-CM | POA: Diagnosis not present

## 2022-02-20 DIAGNOSIS — R159 Full incontinence of feces: Secondary | ICD-10-CM | POA: Diagnosis not present

## 2022-02-20 DIAGNOSIS — I10 Essential (primary) hypertension: Secondary | ICD-10-CM | POA: Diagnosis not present

## 2022-02-20 DIAGNOSIS — Z8616 Personal history of COVID-19: Secondary | ICD-10-CM | POA: Diagnosis not present

## 2022-02-20 DIAGNOSIS — R32 Unspecified urinary incontinence: Secondary | ICD-10-CM | POA: Diagnosis not present

## 2022-02-20 DIAGNOSIS — F32A Depression, unspecified: Secondary | ICD-10-CM | POA: Diagnosis not present

## 2022-02-20 DIAGNOSIS — J309 Allergic rhinitis, unspecified: Secondary | ICD-10-CM | POA: Diagnosis not present

## 2022-02-20 DIAGNOSIS — H4010X0 Unspecified open-angle glaucoma, stage unspecified: Secondary | ICD-10-CM | POA: Diagnosis not present

## 2022-02-20 DIAGNOSIS — M810 Age-related osteoporosis without current pathological fracture: Secondary | ICD-10-CM | POA: Diagnosis not present

## 2022-02-20 DIAGNOSIS — M1990 Unspecified osteoarthritis, unspecified site: Secondary | ICD-10-CM | POA: Diagnosis not present

## 2022-02-20 DIAGNOSIS — Z8709 Personal history of other diseases of the respiratory system: Secondary | ICD-10-CM | POA: Diagnosis not present

## 2022-02-20 DIAGNOSIS — D500 Iron deficiency anemia secondary to blood loss (chronic): Secondary | ICD-10-CM | POA: Diagnosis not present

## 2022-02-20 DIAGNOSIS — K219 Gastro-esophageal reflux disease without esophagitis: Secondary | ICD-10-CM | POA: Diagnosis not present

## 2022-02-20 DIAGNOSIS — E785 Hyperlipidemia, unspecified: Secondary | ICD-10-CM | POA: Diagnosis not present

## 2022-02-21 DIAGNOSIS — K219 Gastro-esophageal reflux disease without esophagitis: Secondary | ICD-10-CM | POA: Diagnosis not present

## 2022-02-21 DIAGNOSIS — C787 Secondary malignant neoplasm of liver and intrahepatic bile duct: Secondary | ICD-10-CM | POA: Diagnosis not present

## 2022-02-21 DIAGNOSIS — I10 Essential (primary) hypertension: Secondary | ICD-10-CM | POA: Diagnosis not present

## 2022-02-21 DIAGNOSIS — D500 Iron deficiency anemia secondary to blood loss (chronic): Secondary | ICD-10-CM | POA: Diagnosis not present

## 2022-02-21 DIAGNOSIS — E785 Hyperlipidemia, unspecified: Secondary | ICD-10-CM | POA: Diagnosis not present

## 2022-02-21 DIAGNOSIS — C189 Malignant neoplasm of colon, unspecified: Secondary | ICD-10-CM | POA: Diagnosis not present

## 2022-02-23 DIAGNOSIS — D500 Iron deficiency anemia secondary to blood loss (chronic): Secondary | ICD-10-CM | POA: Diagnosis not present

## 2022-02-23 DIAGNOSIS — C787 Secondary malignant neoplasm of liver and intrahepatic bile duct: Secondary | ICD-10-CM | POA: Diagnosis not present

## 2022-02-23 DIAGNOSIS — E785 Hyperlipidemia, unspecified: Secondary | ICD-10-CM | POA: Diagnosis not present

## 2022-02-23 DIAGNOSIS — I10 Essential (primary) hypertension: Secondary | ICD-10-CM | POA: Diagnosis not present

## 2022-02-23 DIAGNOSIS — C189 Malignant neoplasm of colon, unspecified: Secondary | ICD-10-CM | POA: Diagnosis not present

## 2022-02-23 DIAGNOSIS — K219 Gastro-esophageal reflux disease without esophagitis: Secondary | ICD-10-CM | POA: Diagnosis not present

## 2022-02-24 DIAGNOSIS — C787 Secondary malignant neoplasm of liver and intrahepatic bile duct: Secondary | ICD-10-CM | POA: Diagnosis not present

## 2022-02-24 DIAGNOSIS — D500 Iron deficiency anemia secondary to blood loss (chronic): Secondary | ICD-10-CM | POA: Diagnosis not present

## 2022-02-24 DIAGNOSIS — I10 Essential (primary) hypertension: Secondary | ICD-10-CM | POA: Diagnosis not present

## 2022-02-24 DIAGNOSIS — C189 Malignant neoplasm of colon, unspecified: Secondary | ICD-10-CM | POA: Diagnosis not present

## 2022-02-24 DIAGNOSIS — K219 Gastro-esophageal reflux disease without esophagitis: Secondary | ICD-10-CM | POA: Diagnosis not present

## 2022-02-24 DIAGNOSIS — E785 Hyperlipidemia, unspecified: Secondary | ICD-10-CM | POA: Diagnosis not present

## 2022-02-25 DIAGNOSIS — C189 Malignant neoplasm of colon, unspecified: Secondary | ICD-10-CM | POA: Diagnosis not present

## 2022-02-25 DIAGNOSIS — D500 Iron deficiency anemia secondary to blood loss (chronic): Secondary | ICD-10-CM | POA: Diagnosis not present

## 2022-02-25 DIAGNOSIS — C787 Secondary malignant neoplasm of liver and intrahepatic bile duct: Secondary | ICD-10-CM | POA: Diagnosis not present

## 2022-02-25 DIAGNOSIS — I10 Essential (primary) hypertension: Secondary | ICD-10-CM | POA: Diagnosis not present

## 2022-02-25 DIAGNOSIS — K219 Gastro-esophageal reflux disease without esophagitis: Secondary | ICD-10-CM | POA: Diagnosis not present

## 2022-02-25 DIAGNOSIS — E785 Hyperlipidemia, unspecified: Secondary | ICD-10-CM | POA: Diagnosis not present

## 2022-02-26 DIAGNOSIS — I10 Essential (primary) hypertension: Secondary | ICD-10-CM | POA: Diagnosis not present

## 2022-02-26 DIAGNOSIS — C787 Secondary malignant neoplasm of liver and intrahepatic bile duct: Secondary | ICD-10-CM | POA: Diagnosis not present

## 2022-02-26 DIAGNOSIS — E785 Hyperlipidemia, unspecified: Secondary | ICD-10-CM | POA: Diagnosis not present

## 2022-02-26 DIAGNOSIS — D500 Iron deficiency anemia secondary to blood loss (chronic): Secondary | ICD-10-CM | POA: Diagnosis not present

## 2022-02-26 DIAGNOSIS — K219 Gastro-esophageal reflux disease without esophagitis: Secondary | ICD-10-CM | POA: Diagnosis not present

## 2022-02-26 DIAGNOSIS — C189 Malignant neoplasm of colon, unspecified: Secondary | ICD-10-CM | POA: Diagnosis not present

## 2022-03-01 DIAGNOSIS — D500 Iron deficiency anemia secondary to blood loss (chronic): Secondary | ICD-10-CM | POA: Diagnosis not present

## 2022-03-01 DIAGNOSIS — K219 Gastro-esophageal reflux disease without esophagitis: Secondary | ICD-10-CM | POA: Diagnosis not present

## 2022-03-01 DIAGNOSIS — C787 Secondary malignant neoplasm of liver and intrahepatic bile duct: Secondary | ICD-10-CM | POA: Diagnosis not present

## 2022-03-01 DIAGNOSIS — I10 Essential (primary) hypertension: Secondary | ICD-10-CM | POA: Diagnosis not present

## 2022-03-01 DIAGNOSIS — C189 Malignant neoplasm of colon, unspecified: Secondary | ICD-10-CM | POA: Diagnosis not present

## 2022-03-01 DIAGNOSIS — E785 Hyperlipidemia, unspecified: Secondary | ICD-10-CM | POA: Diagnosis not present

## 2022-03-03 DIAGNOSIS — K219 Gastro-esophageal reflux disease without esophagitis: Secondary | ICD-10-CM | POA: Diagnosis not present

## 2022-03-03 DIAGNOSIS — E785 Hyperlipidemia, unspecified: Secondary | ICD-10-CM | POA: Diagnosis not present

## 2022-03-03 DIAGNOSIS — C787 Secondary malignant neoplasm of liver and intrahepatic bile duct: Secondary | ICD-10-CM | POA: Diagnosis not present

## 2022-03-03 DIAGNOSIS — D500 Iron deficiency anemia secondary to blood loss (chronic): Secondary | ICD-10-CM | POA: Diagnosis not present

## 2022-03-03 DIAGNOSIS — I10 Essential (primary) hypertension: Secondary | ICD-10-CM | POA: Diagnosis not present

## 2022-03-03 DIAGNOSIS — C189 Malignant neoplasm of colon, unspecified: Secondary | ICD-10-CM | POA: Diagnosis not present

## 2022-03-04 DIAGNOSIS — E785 Hyperlipidemia, unspecified: Secondary | ICD-10-CM | POA: Diagnosis not present

## 2022-03-04 DIAGNOSIS — I10 Essential (primary) hypertension: Secondary | ICD-10-CM | POA: Diagnosis not present

## 2022-03-04 DIAGNOSIS — D500 Iron deficiency anemia secondary to blood loss (chronic): Secondary | ICD-10-CM | POA: Diagnosis not present

## 2022-03-04 DIAGNOSIS — K219 Gastro-esophageal reflux disease without esophagitis: Secondary | ICD-10-CM | POA: Diagnosis not present

## 2022-03-04 DIAGNOSIS — C189 Malignant neoplasm of colon, unspecified: Secondary | ICD-10-CM | POA: Diagnosis not present

## 2022-03-04 DIAGNOSIS — C787 Secondary malignant neoplasm of liver and intrahepatic bile duct: Secondary | ICD-10-CM | POA: Diagnosis not present

## 2022-03-08 DIAGNOSIS — D500 Iron deficiency anemia secondary to blood loss (chronic): Secondary | ICD-10-CM | POA: Diagnosis not present

## 2022-03-08 DIAGNOSIS — C787 Secondary malignant neoplasm of liver and intrahepatic bile duct: Secondary | ICD-10-CM | POA: Diagnosis not present

## 2022-03-08 DIAGNOSIS — C189 Malignant neoplasm of colon, unspecified: Secondary | ICD-10-CM | POA: Diagnosis not present

## 2022-03-08 DIAGNOSIS — E785 Hyperlipidemia, unspecified: Secondary | ICD-10-CM | POA: Diagnosis not present

## 2022-03-08 DIAGNOSIS — K219 Gastro-esophageal reflux disease without esophagitis: Secondary | ICD-10-CM | POA: Diagnosis not present

## 2022-03-08 DIAGNOSIS — I10 Essential (primary) hypertension: Secondary | ICD-10-CM | POA: Diagnosis not present

## 2022-03-09 DIAGNOSIS — C189 Malignant neoplasm of colon, unspecified: Secondary | ICD-10-CM | POA: Diagnosis not present

## 2022-03-09 DIAGNOSIS — C787 Secondary malignant neoplasm of liver and intrahepatic bile duct: Secondary | ICD-10-CM | POA: Diagnosis not present

## 2022-03-09 DIAGNOSIS — E785 Hyperlipidemia, unspecified: Secondary | ICD-10-CM | POA: Diagnosis not present

## 2022-03-09 DIAGNOSIS — I10 Essential (primary) hypertension: Secondary | ICD-10-CM | POA: Diagnosis not present

## 2022-03-09 DIAGNOSIS — D500 Iron deficiency anemia secondary to blood loss (chronic): Secondary | ICD-10-CM | POA: Diagnosis not present

## 2022-03-09 DIAGNOSIS — K219 Gastro-esophageal reflux disease without esophagitis: Secondary | ICD-10-CM | POA: Diagnosis not present

## 2022-03-10 DIAGNOSIS — E785 Hyperlipidemia, unspecified: Secondary | ICD-10-CM | POA: Diagnosis not present

## 2022-03-10 DIAGNOSIS — D500 Iron deficiency anemia secondary to blood loss (chronic): Secondary | ICD-10-CM | POA: Diagnosis not present

## 2022-03-10 DIAGNOSIS — C787 Secondary malignant neoplasm of liver and intrahepatic bile duct: Secondary | ICD-10-CM | POA: Diagnosis not present

## 2022-03-10 DIAGNOSIS — Z79891 Long term (current) use of opiate analgesic: Secondary | ICD-10-CM | POA: Diagnosis not present

## 2022-03-10 DIAGNOSIS — K219 Gastro-esophageal reflux disease without esophagitis: Secondary | ICD-10-CM | POA: Diagnosis not present

## 2022-03-10 DIAGNOSIS — Z9181 History of falling: Secondary | ICD-10-CM | POA: Diagnosis not present

## 2022-03-10 DIAGNOSIS — I10 Essential (primary) hypertension: Secondary | ICD-10-CM | POA: Diagnosis not present

## 2022-03-10 DIAGNOSIS — C189 Malignant neoplasm of colon, unspecified: Secondary | ICD-10-CM | POA: Diagnosis not present

## 2022-03-11 DIAGNOSIS — D500 Iron deficiency anemia secondary to blood loss (chronic): Secondary | ICD-10-CM | POA: Diagnosis not present

## 2022-03-11 DIAGNOSIS — K219 Gastro-esophageal reflux disease without esophagitis: Secondary | ICD-10-CM | POA: Diagnosis not present

## 2022-03-11 DIAGNOSIS — I10 Essential (primary) hypertension: Secondary | ICD-10-CM | POA: Diagnosis not present

## 2022-03-11 DIAGNOSIS — E785 Hyperlipidemia, unspecified: Secondary | ICD-10-CM | POA: Diagnosis not present

## 2022-03-11 DIAGNOSIS — C787 Secondary malignant neoplasm of liver and intrahepatic bile duct: Secondary | ICD-10-CM | POA: Diagnosis not present

## 2022-03-11 DIAGNOSIS — C189 Malignant neoplasm of colon, unspecified: Secondary | ICD-10-CM | POA: Diagnosis not present

## 2022-03-15 DIAGNOSIS — K219 Gastro-esophageal reflux disease without esophagitis: Secondary | ICD-10-CM | POA: Diagnosis not present

## 2022-03-15 DIAGNOSIS — I10 Essential (primary) hypertension: Secondary | ICD-10-CM | POA: Diagnosis not present

## 2022-03-15 DIAGNOSIS — C189 Malignant neoplasm of colon, unspecified: Secondary | ICD-10-CM | POA: Diagnosis not present

## 2022-03-15 DIAGNOSIS — D500 Iron deficiency anemia secondary to blood loss (chronic): Secondary | ICD-10-CM | POA: Diagnosis not present

## 2022-03-15 DIAGNOSIS — C787 Secondary malignant neoplasm of liver and intrahepatic bile duct: Secondary | ICD-10-CM | POA: Diagnosis not present

## 2022-03-15 DIAGNOSIS — E785 Hyperlipidemia, unspecified: Secondary | ICD-10-CM | POA: Diagnosis not present

## 2022-03-16 DIAGNOSIS — D500 Iron deficiency anemia secondary to blood loss (chronic): Secondary | ICD-10-CM | POA: Diagnosis not present

## 2022-03-16 DIAGNOSIS — C189 Malignant neoplasm of colon, unspecified: Secondary | ICD-10-CM | POA: Diagnosis not present

## 2022-03-16 DIAGNOSIS — K219 Gastro-esophageal reflux disease without esophagitis: Secondary | ICD-10-CM | POA: Diagnosis not present

## 2022-03-16 DIAGNOSIS — I10 Essential (primary) hypertension: Secondary | ICD-10-CM | POA: Diagnosis not present

## 2022-03-16 DIAGNOSIS — C787 Secondary malignant neoplasm of liver and intrahepatic bile duct: Secondary | ICD-10-CM | POA: Diagnosis not present

## 2022-03-16 DIAGNOSIS — E785 Hyperlipidemia, unspecified: Secondary | ICD-10-CM | POA: Diagnosis not present

## 2022-03-17 DIAGNOSIS — D500 Iron deficiency anemia secondary to blood loss (chronic): Secondary | ICD-10-CM | POA: Diagnosis not present

## 2022-03-17 DIAGNOSIS — C189 Malignant neoplasm of colon, unspecified: Secondary | ICD-10-CM | POA: Diagnosis not present

## 2022-03-17 DIAGNOSIS — E785 Hyperlipidemia, unspecified: Secondary | ICD-10-CM | POA: Diagnosis not present

## 2022-03-17 DIAGNOSIS — K219 Gastro-esophageal reflux disease without esophagitis: Secondary | ICD-10-CM | POA: Diagnosis not present

## 2022-03-17 DIAGNOSIS — C787 Secondary malignant neoplasm of liver and intrahepatic bile duct: Secondary | ICD-10-CM | POA: Diagnosis not present

## 2022-03-17 DIAGNOSIS — I10 Essential (primary) hypertension: Secondary | ICD-10-CM | POA: Diagnosis not present

## 2022-03-18 DIAGNOSIS — E785 Hyperlipidemia, unspecified: Secondary | ICD-10-CM | POA: Diagnosis not present

## 2022-03-18 DIAGNOSIS — I10 Essential (primary) hypertension: Secondary | ICD-10-CM | POA: Diagnosis not present

## 2022-03-18 DIAGNOSIS — C189 Malignant neoplasm of colon, unspecified: Secondary | ICD-10-CM | POA: Diagnosis not present

## 2022-03-18 DIAGNOSIS — C787 Secondary malignant neoplasm of liver and intrahepatic bile duct: Secondary | ICD-10-CM | POA: Diagnosis not present

## 2022-03-18 DIAGNOSIS — D500 Iron deficiency anemia secondary to blood loss (chronic): Secondary | ICD-10-CM | POA: Diagnosis not present

## 2022-03-18 DIAGNOSIS — K219 Gastro-esophageal reflux disease without esophagitis: Secondary | ICD-10-CM | POA: Diagnosis not present

## 2022-03-22 DIAGNOSIS — J309 Allergic rhinitis, unspecified: Secondary | ICD-10-CM | POA: Diagnosis not present

## 2022-03-22 DIAGNOSIS — E785 Hyperlipidemia, unspecified: Secondary | ICD-10-CM | POA: Diagnosis not present

## 2022-03-22 DIAGNOSIS — Z9181 History of falling: Secondary | ICD-10-CM | POA: Diagnosis not present

## 2022-03-22 DIAGNOSIS — C189 Malignant neoplasm of colon, unspecified: Secondary | ICD-10-CM | POA: Diagnosis not present

## 2022-03-22 DIAGNOSIS — D500 Iron deficiency anemia secondary to blood loss (chronic): Secondary | ICD-10-CM | POA: Diagnosis not present

## 2022-03-22 DIAGNOSIS — R32 Unspecified urinary incontinence: Secondary | ICD-10-CM | POA: Diagnosis not present

## 2022-03-22 DIAGNOSIS — Z7401 Bed confinement status: Secondary | ICD-10-CM | POA: Diagnosis not present

## 2022-03-22 DIAGNOSIS — K219 Gastro-esophageal reflux disease without esophagitis: Secondary | ICD-10-CM | POA: Diagnosis not present

## 2022-03-22 DIAGNOSIS — C787 Secondary malignant neoplasm of liver and intrahepatic bile duct: Secondary | ICD-10-CM | POA: Diagnosis not present

## 2022-03-22 DIAGNOSIS — I10 Essential (primary) hypertension: Secondary | ICD-10-CM | POA: Diagnosis not present

## 2022-03-22 DIAGNOSIS — H4010X0 Unspecified open-angle glaucoma, stage unspecified: Secondary | ICD-10-CM | POA: Diagnosis not present

## 2022-03-22 DIAGNOSIS — M810 Age-related osteoporosis without current pathological fracture: Secondary | ICD-10-CM | POA: Diagnosis not present

## 2022-03-22 DIAGNOSIS — R634 Abnormal weight loss: Secondary | ICD-10-CM | POA: Diagnosis not present

## 2022-03-22 DIAGNOSIS — F32A Depression, unspecified: Secondary | ICD-10-CM | POA: Diagnosis not present

## 2022-03-22 DIAGNOSIS — M1990 Unspecified osteoarthritis, unspecified site: Secondary | ICD-10-CM | POA: Diagnosis not present

## 2022-03-22 DIAGNOSIS — Z8709 Personal history of other diseases of the respiratory system: Secondary | ICD-10-CM | POA: Diagnosis not present

## 2022-03-22 DIAGNOSIS — Z86711 Personal history of pulmonary embolism: Secondary | ICD-10-CM | POA: Diagnosis not present

## 2022-03-22 DIAGNOSIS — Z8616 Personal history of COVID-19: Secondary | ICD-10-CM | POA: Diagnosis not present

## 2022-03-22 DIAGNOSIS — Z741 Need for assistance with personal care: Secondary | ICD-10-CM | POA: Diagnosis not present

## 2022-03-22 DIAGNOSIS — R159 Full incontinence of feces: Secondary | ICD-10-CM | POA: Diagnosis not present

## 2022-03-22 DIAGNOSIS — Z6827 Body mass index (BMI) 27.0-27.9, adult: Secondary | ICD-10-CM | POA: Diagnosis not present

## 2022-03-24 DIAGNOSIS — E785 Hyperlipidemia, unspecified: Secondary | ICD-10-CM | POA: Diagnosis not present

## 2022-03-24 DIAGNOSIS — I10 Essential (primary) hypertension: Secondary | ICD-10-CM | POA: Diagnosis not present

## 2022-03-24 DIAGNOSIS — K219 Gastro-esophageal reflux disease without esophagitis: Secondary | ICD-10-CM | POA: Diagnosis not present

## 2022-03-24 DIAGNOSIS — C787 Secondary malignant neoplasm of liver and intrahepatic bile duct: Secondary | ICD-10-CM | POA: Diagnosis not present

## 2022-03-24 DIAGNOSIS — C189 Malignant neoplasm of colon, unspecified: Secondary | ICD-10-CM | POA: Diagnosis not present

## 2022-03-24 DIAGNOSIS — D500 Iron deficiency anemia secondary to blood loss (chronic): Secondary | ICD-10-CM | POA: Diagnosis not present

## 2022-03-25 DIAGNOSIS — D500 Iron deficiency anemia secondary to blood loss (chronic): Secondary | ICD-10-CM | POA: Diagnosis not present

## 2022-03-25 DIAGNOSIS — C189 Malignant neoplasm of colon, unspecified: Secondary | ICD-10-CM | POA: Diagnosis not present

## 2022-03-25 DIAGNOSIS — E785 Hyperlipidemia, unspecified: Secondary | ICD-10-CM | POA: Diagnosis not present

## 2022-03-25 DIAGNOSIS — I10 Essential (primary) hypertension: Secondary | ICD-10-CM | POA: Diagnosis not present

## 2022-03-25 DIAGNOSIS — K219 Gastro-esophageal reflux disease without esophagitis: Secondary | ICD-10-CM | POA: Diagnosis not present

## 2022-03-25 DIAGNOSIS — C787 Secondary malignant neoplasm of liver and intrahepatic bile duct: Secondary | ICD-10-CM | POA: Diagnosis not present

## 2022-03-29 DIAGNOSIS — K219 Gastro-esophageal reflux disease without esophagitis: Secondary | ICD-10-CM | POA: Diagnosis not present

## 2022-03-29 DIAGNOSIS — I10 Essential (primary) hypertension: Secondary | ICD-10-CM | POA: Diagnosis not present

## 2022-03-29 DIAGNOSIS — C787 Secondary malignant neoplasm of liver and intrahepatic bile duct: Secondary | ICD-10-CM | POA: Diagnosis not present

## 2022-03-29 DIAGNOSIS — D500 Iron deficiency anemia secondary to blood loss (chronic): Secondary | ICD-10-CM | POA: Diagnosis not present

## 2022-03-29 DIAGNOSIS — E785 Hyperlipidemia, unspecified: Secondary | ICD-10-CM | POA: Diagnosis not present

## 2022-03-29 DIAGNOSIS — C189 Malignant neoplasm of colon, unspecified: Secondary | ICD-10-CM | POA: Diagnosis not present

## 2022-03-30 DIAGNOSIS — E785 Hyperlipidemia, unspecified: Secondary | ICD-10-CM | POA: Diagnosis not present

## 2022-03-30 DIAGNOSIS — K219 Gastro-esophageal reflux disease without esophagitis: Secondary | ICD-10-CM | POA: Diagnosis not present

## 2022-03-30 DIAGNOSIS — C787 Secondary malignant neoplasm of liver and intrahepatic bile duct: Secondary | ICD-10-CM | POA: Diagnosis not present

## 2022-03-30 DIAGNOSIS — I10 Essential (primary) hypertension: Secondary | ICD-10-CM | POA: Diagnosis not present

## 2022-03-30 DIAGNOSIS — C189 Malignant neoplasm of colon, unspecified: Secondary | ICD-10-CM | POA: Diagnosis not present

## 2022-03-30 DIAGNOSIS — D500 Iron deficiency anemia secondary to blood loss (chronic): Secondary | ICD-10-CM | POA: Diagnosis not present

## 2022-03-31 DIAGNOSIS — D500 Iron deficiency anemia secondary to blood loss (chronic): Secondary | ICD-10-CM | POA: Diagnosis not present

## 2022-03-31 DIAGNOSIS — C189 Malignant neoplasm of colon, unspecified: Secondary | ICD-10-CM | POA: Diagnosis not present

## 2022-03-31 DIAGNOSIS — I10 Essential (primary) hypertension: Secondary | ICD-10-CM | POA: Diagnosis not present

## 2022-03-31 DIAGNOSIS — E785 Hyperlipidemia, unspecified: Secondary | ICD-10-CM | POA: Diagnosis not present

## 2022-03-31 DIAGNOSIS — C787 Secondary malignant neoplasm of liver and intrahepatic bile duct: Secondary | ICD-10-CM | POA: Diagnosis not present

## 2022-03-31 DIAGNOSIS — K219 Gastro-esophageal reflux disease without esophagitis: Secondary | ICD-10-CM | POA: Diagnosis not present

## 2022-04-02 DIAGNOSIS — R269 Unspecified abnormalities of gait and mobility: Secondary | ICD-10-CM | POA: Diagnosis not present

## 2022-04-02 DIAGNOSIS — M179 Osteoarthritis of knee, unspecified: Secondary | ICD-10-CM | POA: Diagnosis not present

## 2022-04-02 DIAGNOSIS — F329 Major depressive disorder, single episode, unspecified: Secondary | ICD-10-CM | POA: Diagnosis not present

## 2022-04-02 DIAGNOSIS — C787 Secondary malignant neoplasm of liver and intrahepatic bile duct: Secondary | ICD-10-CM | POA: Diagnosis not present

## 2022-04-02 DIAGNOSIS — I10 Essential (primary) hypertension: Secondary | ICD-10-CM | POA: Diagnosis not present

## 2022-04-02 DIAGNOSIS — E785 Hyperlipidemia, unspecified: Secondary | ICD-10-CM | POA: Diagnosis not present

## 2022-04-02 DIAGNOSIS — D860 Sarcoidosis of lung: Secondary | ICD-10-CM | POA: Diagnosis not present

## 2022-04-02 DIAGNOSIS — C189 Malignant neoplasm of colon, unspecified: Secondary | ICD-10-CM | POA: Diagnosis not present

## 2022-04-02 DIAGNOSIS — K219 Gastro-esophageal reflux disease without esophagitis: Secondary | ICD-10-CM | POA: Diagnosis not present

## 2022-04-02 DIAGNOSIS — D649 Anemia, unspecified: Secondary | ICD-10-CM | POA: Diagnosis not present

## 2022-04-02 DIAGNOSIS — M8580 Other specified disorders of bone density and structure, unspecified site: Secondary | ICD-10-CM | POA: Diagnosis not present

## 2022-04-02 DIAGNOSIS — D500 Iron deficiency anemia secondary to blood loss (chronic): Secondary | ICD-10-CM | POA: Diagnosis not present

## 2022-04-05 DIAGNOSIS — E785 Hyperlipidemia, unspecified: Secondary | ICD-10-CM | POA: Diagnosis not present

## 2022-04-05 DIAGNOSIS — C787 Secondary malignant neoplasm of liver and intrahepatic bile duct: Secondary | ICD-10-CM | POA: Diagnosis not present

## 2022-04-05 DIAGNOSIS — K219 Gastro-esophageal reflux disease without esophagitis: Secondary | ICD-10-CM | POA: Diagnosis not present

## 2022-04-05 DIAGNOSIS — C189 Malignant neoplasm of colon, unspecified: Secondary | ICD-10-CM | POA: Diagnosis not present

## 2022-04-05 DIAGNOSIS — D500 Iron deficiency anemia secondary to blood loss (chronic): Secondary | ICD-10-CM | POA: Diagnosis not present

## 2022-04-05 DIAGNOSIS — I10 Essential (primary) hypertension: Secondary | ICD-10-CM | POA: Diagnosis not present

## 2022-04-06 DIAGNOSIS — I10 Essential (primary) hypertension: Secondary | ICD-10-CM | POA: Diagnosis not present

## 2022-04-06 DIAGNOSIS — D500 Iron deficiency anemia secondary to blood loss (chronic): Secondary | ICD-10-CM | POA: Diagnosis not present

## 2022-04-06 DIAGNOSIS — E785 Hyperlipidemia, unspecified: Secondary | ICD-10-CM | POA: Diagnosis not present

## 2022-04-06 DIAGNOSIS — C189 Malignant neoplasm of colon, unspecified: Secondary | ICD-10-CM | POA: Diagnosis not present

## 2022-04-06 DIAGNOSIS — K219 Gastro-esophageal reflux disease without esophagitis: Secondary | ICD-10-CM | POA: Diagnosis not present

## 2022-04-06 DIAGNOSIS — C787 Secondary malignant neoplasm of liver and intrahepatic bile duct: Secondary | ICD-10-CM | POA: Diagnosis not present

## 2022-04-07 DIAGNOSIS — D500 Iron deficiency anemia secondary to blood loss (chronic): Secondary | ICD-10-CM | POA: Diagnosis not present

## 2022-04-07 DIAGNOSIS — C787 Secondary malignant neoplasm of liver and intrahepatic bile duct: Secondary | ICD-10-CM | POA: Diagnosis not present

## 2022-04-07 DIAGNOSIS — E785 Hyperlipidemia, unspecified: Secondary | ICD-10-CM | POA: Diagnosis not present

## 2022-04-07 DIAGNOSIS — I10 Essential (primary) hypertension: Secondary | ICD-10-CM | POA: Diagnosis not present

## 2022-04-07 DIAGNOSIS — K219 Gastro-esophageal reflux disease without esophagitis: Secondary | ICD-10-CM | POA: Diagnosis not present

## 2022-04-07 DIAGNOSIS — C189 Malignant neoplasm of colon, unspecified: Secondary | ICD-10-CM | POA: Diagnosis not present

## 2022-04-08 DIAGNOSIS — E785 Hyperlipidemia, unspecified: Secondary | ICD-10-CM | POA: Diagnosis not present

## 2022-04-08 DIAGNOSIS — C189 Malignant neoplasm of colon, unspecified: Secondary | ICD-10-CM | POA: Diagnosis not present

## 2022-04-08 DIAGNOSIS — D500 Iron deficiency anemia secondary to blood loss (chronic): Secondary | ICD-10-CM | POA: Diagnosis not present

## 2022-04-08 DIAGNOSIS — K219 Gastro-esophageal reflux disease without esophagitis: Secondary | ICD-10-CM | POA: Diagnosis not present

## 2022-04-08 DIAGNOSIS — I10 Essential (primary) hypertension: Secondary | ICD-10-CM | POA: Diagnosis not present

## 2022-04-08 DIAGNOSIS — C787 Secondary malignant neoplasm of liver and intrahepatic bile duct: Secondary | ICD-10-CM | POA: Diagnosis not present

## 2022-04-09 DIAGNOSIS — E785 Hyperlipidemia, unspecified: Secondary | ICD-10-CM | POA: Diagnosis not present

## 2022-04-09 DIAGNOSIS — C189 Malignant neoplasm of colon, unspecified: Secondary | ICD-10-CM | POA: Diagnosis not present

## 2022-04-09 DIAGNOSIS — D500 Iron deficiency anemia secondary to blood loss (chronic): Secondary | ICD-10-CM | POA: Diagnosis not present

## 2022-04-09 DIAGNOSIS — C787 Secondary malignant neoplasm of liver and intrahepatic bile duct: Secondary | ICD-10-CM | POA: Diagnosis not present

## 2022-04-09 DIAGNOSIS — I10 Essential (primary) hypertension: Secondary | ICD-10-CM | POA: Diagnosis not present

## 2022-04-09 DIAGNOSIS — K219 Gastro-esophageal reflux disease without esophagitis: Secondary | ICD-10-CM | POA: Diagnosis not present

## 2022-04-12 DIAGNOSIS — I10 Essential (primary) hypertension: Secondary | ICD-10-CM | POA: Diagnosis not present

## 2022-04-12 DIAGNOSIS — K219 Gastro-esophageal reflux disease without esophagitis: Secondary | ICD-10-CM | POA: Diagnosis not present

## 2022-04-12 DIAGNOSIS — C189 Malignant neoplasm of colon, unspecified: Secondary | ICD-10-CM | POA: Diagnosis not present

## 2022-04-12 DIAGNOSIS — D500 Iron deficiency anemia secondary to blood loss (chronic): Secondary | ICD-10-CM | POA: Diagnosis not present

## 2022-04-12 DIAGNOSIS — C787 Secondary malignant neoplasm of liver and intrahepatic bile duct: Secondary | ICD-10-CM | POA: Diagnosis not present

## 2022-04-12 DIAGNOSIS — E785 Hyperlipidemia, unspecified: Secondary | ICD-10-CM | POA: Diagnosis not present

## 2022-04-13 DIAGNOSIS — E785 Hyperlipidemia, unspecified: Secondary | ICD-10-CM | POA: Diagnosis not present

## 2022-04-13 DIAGNOSIS — K219 Gastro-esophageal reflux disease without esophagitis: Secondary | ICD-10-CM | POA: Diagnosis not present

## 2022-04-13 DIAGNOSIS — D500 Iron deficiency anemia secondary to blood loss (chronic): Secondary | ICD-10-CM | POA: Diagnosis not present

## 2022-04-13 DIAGNOSIS — I10 Essential (primary) hypertension: Secondary | ICD-10-CM | POA: Diagnosis not present

## 2022-04-13 DIAGNOSIS — C189 Malignant neoplasm of colon, unspecified: Secondary | ICD-10-CM | POA: Diagnosis not present

## 2022-04-13 DIAGNOSIS — C787 Secondary malignant neoplasm of liver and intrahepatic bile duct: Secondary | ICD-10-CM | POA: Diagnosis not present

## 2022-04-14 DIAGNOSIS — I10 Essential (primary) hypertension: Secondary | ICD-10-CM | POA: Diagnosis not present

## 2022-04-14 DIAGNOSIS — C787 Secondary malignant neoplasm of liver and intrahepatic bile duct: Secondary | ICD-10-CM | POA: Diagnosis not present

## 2022-04-14 DIAGNOSIS — E785 Hyperlipidemia, unspecified: Secondary | ICD-10-CM | POA: Diagnosis not present

## 2022-04-14 DIAGNOSIS — D500 Iron deficiency anemia secondary to blood loss (chronic): Secondary | ICD-10-CM | POA: Diagnosis not present

## 2022-04-14 DIAGNOSIS — K219 Gastro-esophageal reflux disease without esophagitis: Secondary | ICD-10-CM | POA: Diagnosis not present

## 2022-04-14 DIAGNOSIS — C189 Malignant neoplasm of colon, unspecified: Secondary | ICD-10-CM | POA: Diagnosis not present

## 2022-04-15 DIAGNOSIS — K219 Gastro-esophageal reflux disease without esophagitis: Secondary | ICD-10-CM | POA: Diagnosis not present

## 2022-04-15 DIAGNOSIS — I10 Essential (primary) hypertension: Secondary | ICD-10-CM | POA: Diagnosis not present

## 2022-04-15 DIAGNOSIS — C787 Secondary malignant neoplasm of liver and intrahepatic bile duct: Secondary | ICD-10-CM | POA: Diagnosis not present

## 2022-04-15 DIAGNOSIS — E785 Hyperlipidemia, unspecified: Secondary | ICD-10-CM | POA: Diagnosis not present

## 2022-04-15 DIAGNOSIS — D500 Iron deficiency anemia secondary to blood loss (chronic): Secondary | ICD-10-CM | POA: Diagnosis not present

## 2022-04-15 DIAGNOSIS — C189 Malignant neoplasm of colon, unspecified: Secondary | ICD-10-CM | POA: Diagnosis not present

## 2022-04-16 DIAGNOSIS — I10 Essential (primary) hypertension: Secondary | ICD-10-CM | POA: Diagnosis not present

## 2022-04-16 DIAGNOSIS — C189 Malignant neoplasm of colon, unspecified: Secondary | ICD-10-CM | POA: Diagnosis not present

## 2022-04-16 DIAGNOSIS — K219 Gastro-esophageal reflux disease without esophagitis: Secondary | ICD-10-CM | POA: Diagnosis not present

## 2022-04-16 DIAGNOSIS — C787 Secondary malignant neoplasm of liver and intrahepatic bile duct: Secondary | ICD-10-CM | POA: Diagnosis not present

## 2022-04-16 DIAGNOSIS — D500 Iron deficiency anemia secondary to blood loss (chronic): Secondary | ICD-10-CM | POA: Diagnosis not present

## 2022-04-16 DIAGNOSIS — E785 Hyperlipidemia, unspecified: Secondary | ICD-10-CM | POA: Diagnosis not present

## 2022-04-19 DIAGNOSIS — C787 Secondary malignant neoplasm of liver and intrahepatic bile duct: Secondary | ICD-10-CM | POA: Diagnosis not present

## 2022-04-19 DIAGNOSIS — D500 Iron deficiency anemia secondary to blood loss (chronic): Secondary | ICD-10-CM | POA: Diagnosis not present

## 2022-04-19 DIAGNOSIS — C189 Malignant neoplasm of colon, unspecified: Secondary | ICD-10-CM | POA: Diagnosis not present

## 2022-04-19 DIAGNOSIS — I10 Essential (primary) hypertension: Secondary | ICD-10-CM | POA: Diagnosis not present

## 2022-04-19 DIAGNOSIS — K219 Gastro-esophageal reflux disease without esophagitis: Secondary | ICD-10-CM | POA: Diagnosis not present

## 2022-04-19 DIAGNOSIS — E785 Hyperlipidemia, unspecified: Secondary | ICD-10-CM | POA: Diagnosis not present

## 2022-04-20 DIAGNOSIS — K219 Gastro-esophageal reflux disease without esophagitis: Secondary | ICD-10-CM | POA: Diagnosis not present

## 2022-04-20 DIAGNOSIS — C787 Secondary malignant neoplasm of liver and intrahepatic bile duct: Secondary | ICD-10-CM | POA: Diagnosis not present

## 2022-04-20 DIAGNOSIS — I10 Essential (primary) hypertension: Secondary | ICD-10-CM | POA: Diagnosis not present

## 2022-04-20 DIAGNOSIS — C189 Malignant neoplasm of colon, unspecified: Secondary | ICD-10-CM | POA: Diagnosis not present

## 2022-04-20 DIAGNOSIS — E785 Hyperlipidemia, unspecified: Secondary | ICD-10-CM | POA: Diagnosis not present

## 2022-04-20 DIAGNOSIS — D500 Iron deficiency anemia secondary to blood loss (chronic): Secondary | ICD-10-CM | POA: Diagnosis not present

## 2022-04-21 DIAGNOSIS — C189 Malignant neoplasm of colon, unspecified: Secondary | ICD-10-CM | POA: Diagnosis not present

## 2022-04-21 DIAGNOSIS — D500 Iron deficiency anemia secondary to blood loss (chronic): Secondary | ICD-10-CM | POA: Diagnosis not present

## 2022-04-21 DIAGNOSIS — C787 Secondary malignant neoplasm of liver and intrahepatic bile duct: Secondary | ICD-10-CM | POA: Diagnosis not present

## 2022-04-21 DIAGNOSIS — I10 Essential (primary) hypertension: Secondary | ICD-10-CM | POA: Diagnosis not present

## 2022-04-21 DIAGNOSIS — E785 Hyperlipidemia, unspecified: Secondary | ICD-10-CM | POA: Diagnosis not present

## 2022-04-21 DIAGNOSIS — K219 Gastro-esophageal reflux disease without esophagitis: Secondary | ICD-10-CM | POA: Diagnosis not present

## 2022-04-22 DIAGNOSIS — K219 Gastro-esophageal reflux disease without esophagitis: Secondary | ICD-10-CM | POA: Diagnosis not present

## 2022-04-22 DIAGNOSIS — F32A Depression, unspecified: Secondary | ICD-10-CM | POA: Diagnosis not present

## 2022-04-22 DIAGNOSIS — I10 Essential (primary) hypertension: Secondary | ICD-10-CM | POA: Diagnosis not present

## 2022-04-22 DIAGNOSIS — Z7401 Bed confinement status: Secondary | ICD-10-CM | POA: Diagnosis not present

## 2022-04-22 DIAGNOSIS — J309 Allergic rhinitis, unspecified: Secondary | ICD-10-CM | POA: Diagnosis not present

## 2022-04-22 DIAGNOSIS — Z86711 Personal history of pulmonary embolism: Secondary | ICD-10-CM | POA: Diagnosis not present

## 2022-04-22 DIAGNOSIS — R159 Full incontinence of feces: Secondary | ICD-10-CM | POA: Diagnosis not present

## 2022-04-22 DIAGNOSIS — Z8709 Personal history of other diseases of the respiratory system: Secondary | ICD-10-CM | POA: Diagnosis not present

## 2022-04-22 DIAGNOSIS — M810 Age-related osteoporosis without current pathological fracture: Secondary | ICD-10-CM | POA: Diagnosis not present

## 2022-04-22 DIAGNOSIS — C787 Secondary malignant neoplasm of liver and intrahepatic bile duct: Secondary | ICD-10-CM | POA: Diagnosis not present

## 2022-04-22 DIAGNOSIS — R634 Abnormal weight loss: Secondary | ICD-10-CM | POA: Diagnosis not present

## 2022-04-22 DIAGNOSIS — Z741 Need for assistance with personal care: Secondary | ICD-10-CM | POA: Diagnosis not present

## 2022-04-22 DIAGNOSIS — D500 Iron deficiency anemia secondary to blood loss (chronic): Secondary | ICD-10-CM | POA: Diagnosis not present

## 2022-04-22 DIAGNOSIS — Z8616 Personal history of COVID-19: Secondary | ICD-10-CM | POA: Diagnosis not present

## 2022-04-22 DIAGNOSIS — M1990 Unspecified osteoarthritis, unspecified site: Secondary | ICD-10-CM | POA: Diagnosis not present

## 2022-04-22 DIAGNOSIS — C189 Malignant neoplasm of colon, unspecified: Secondary | ICD-10-CM | POA: Diagnosis not present

## 2022-04-22 DIAGNOSIS — R32 Unspecified urinary incontinence: Secondary | ICD-10-CM | POA: Diagnosis not present

## 2022-04-22 DIAGNOSIS — Z6827 Body mass index (BMI) 27.0-27.9, adult: Secondary | ICD-10-CM | POA: Diagnosis not present

## 2022-04-22 DIAGNOSIS — H4010X0 Unspecified open-angle glaucoma, stage unspecified: Secondary | ICD-10-CM | POA: Diagnosis not present

## 2022-04-22 DIAGNOSIS — E785 Hyperlipidemia, unspecified: Secondary | ICD-10-CM | POA: Diagnosis not present

## 2022-04-22 DIAGNOSIS — Z9181 History of falling: Secondary | ICD-10-CM | POA: Diagnosis not present

## 2022-04-23 DIAGNOSIS — C787 Secondary malignant neoplasm of liver and intrahepatic bile duct: Secondary | ICD-10-CM | POA: Diagnosis not present

## 2022-04-23 DIAGNOSIS — E785 Hyperlipidemia, unspecified: Secondary | ICD-10-CM | POA: Diagnosis not present

## 2022-04-23 DIAGNOSIS — D500 Iron deficiency anemia secondary to blood loss (chronic): Secondary | ICD-10-CM | POA: Diagnosis not present

## 2022-04-23 DIAGNOSIS — K219 Gastro-esophageal reflux disease without esophagitis: Secondary | ICD-10-CM | POA: Diagnosis not present

## 2022-04-23 DIAGNOSIS — I10 Essential (primary) hypertension: Secondary | ICD-10-CM | POA: Diagnosis not present

## 2022-04-23 DIAGNOSIS — C189 Malignant neoplasm of colon, unspecified: Secondary | ICD-10-CM | POA: Diagnosis not present

## 2022-04-26 DIAGNOSIS — K219 Gastro-esophageal reflux disease without esophagitis: Secondary | ICD-10-CM | POA: Diagnosis not present

## 2022-04-26 DIAGNOSIS — C787 Secondary malignant neoplasm of liver and intrahepatic bile duct: Secondary | ICD-10-CM | POA: Diagnosis not present

## 2022-04-26 DIAGNOSIS — D500 Iron deficiency anemia secondary to blood loss (chronic): Secondary | ICD-10-CM | POA: Diagnosis not present

## 2022-04-26 DIAGNOSIS — C189 Malignant neoplasm of colon, unspecified: Secondary | ICD-10-CM | POA: Diagnosis not present

## 2022-04-26 DIAGNOSIS — I10 Essential (primary) hypertension: Secondary | ICD-10-CM | POA: Diagnosis not present

## 2022-04-26 DIAGNOSIS — E785 Hyperlipidemia, unspecified: Secondary | ICD-10-CM | POA: Diagnosis not present

## 2022-04-27 DIAGNOSIS — C787 Secondary malignant neoplasm of liver and intrahepatic bile duct: Secondary | ICD-10-CM | POA: Diagnosis not present

## 2022-04-27 DIAGNOSIS — K219 Gastro-esophageal reflux disease without esophagitis: Secondary | ICD-10-CM | POA: Diagnosis not present

## 2022-04-27 DIAGNOSIS — E785 Hyperlipidemia, unspecified: Secondary | ICD-10-CM | POA: Diagnosis not present

## 2022-04-27 DIAGNOSIS — I10 Essential (primary) hypertension: Secondary | ICD-10-CM | POA: Diagnosis not present

## 2022-04-27 DIAGNOSIS — D500 Iron deficiency anemia secondary to blood loss (chronic): Secondary | ICD-10-CM | POA: Diagnosis not present

## 2022-04-27 DIAGNOSIS — C189 Malignant neoplasm of colon, unspecified: Secondary | ICD-10-CM | POA: Diagnosis not present

## 2022-04-28 DIAGNOSIS — D500 Iron deficiency anemia secondary to blood loss (chronic): Secondary | ICD-10-CM | POA: Diagnosis not present

## 2022-04-28 DIAGNOSIS — E785 Hyperlipidemia, unspecified: Secondary | ICD-10-CM | POA: Diagnosis not present

## 2022-04-28 DIAGNOSIS — I10 Essential (primary) hypertension: Secondary | ICD-10-CM | POA: Diagnosis not present

## 2022-04-28 DIAGNOSIS — C787 Secondary malignant neoplasm of liver and intrahepatic bile duct: Secondary | ICD-10-CM | POA: Diagnosis not present

## 2022-04-28 DIAGNOSIS — K219 Gastro-esophageal reflux disease without esophagitis: Secondary | ICD-10-CM | POA: Diagnosis not present

## 2022-04-28 DIAGNOSIS — C189 Malignant neoplasm of colon, unspecified: Secondary | ICD-10-CM | POA: Diagnosis not present

## 2022-04-29 DIAGNOSIS — C189 Malignant neoplasm of colon, unspecified: Secondary | ICD-10-CM | POA: Diagnosis not present

## 2022-04-29 DIAGNOSIS — K219 Gastro-esophageal reflux disease without esophagitis: Secondary | ICD-10-CM | POA: Diagnosis not present

## 2022-04-29 DIAGNOSIS — C787 Secondary malignant neoplasm of liver and intrahepatic bile duct: Secondary | ICD-10-CM | POA: Diagnosis not present

## 2022-04-29 DIAGNOSIS — D500 Iron deficiency anemia secondary to blood loss (chronic): Secondary | ICD-10-CM | POA: Diagnosis not present

## 2022-04-29 DIAGNOSIS — E785 Hyperlipidemia, unspecified: Secondary | ICD-10-CM | POA: Diagnosis not present

## 2022-04-29 DIAGNOSIS — I10 Essential (primary) hypertension: Secondary | ICD-10-CM | POA: Diagnosis not present

## 2022-04-30 DIAGNOSIS — C787 Secondary malignant neoplasm of liver and intrahepatic bile duct: Secondary | ICD-10-CM | POA: Diagnosis not present

## 2022-04-30 DIAGNOSIS — K219 Gastro-esophageal reflux disease without esophagitis: Secondary | ICD-10-CM | POA: Diagnosis not present

## 2022-04-30 DIAGNOSIS — C189 Malignant neoplasm of colon, unspecified: Secondary | ICD-10-CM | POA: Diagnosis not present

## 2022-04-30 DIAGNOSIS — D500 Iron deficiency anemia secondary to blood loss (chronic): Secondary | ICD-10-CM | POA: Diagnosis not present

## 2022-04-30 DIAGNOSIS — I10 Essential (primary) hypertension: Secondary | ICD-10-CM | POA: Diagnosis not present

## 2022-04-30 DIAGNOSIS — E785 Hyperlipidemia, unspecified: Secondary | ICD-10-CM | POA: Diagnosis not present

## 2022-05-03 DIAGNOSIS — E785 Hyperlipidemia, unspecified: Secondary | ICD-10-CM | POA: Diagnosis not present

## 2022-05-03 DIAGNOSIS — D500 Iron deficiency anemia secondary to blood loss (chronic): Secondary | ICD-10-CM | POA: Diagnosis not present

## 2022-05-03 DIAGNOSIS — C189 Malignant neoplasm of colon, unspecified: Secondary | ICD-10-CM | POA: Diagnosis not present

## 2022-05-03 DIAGNOSIS — K219 Gastro-esophageal reflux disease without esophagitis: Secondary | ICD-10-CM | POA: Diagnosis not present

## 2022-05-03 DIAGNOSIS — C787 Secondary malignant neoplasm of liver and intrahepatic bile duct: Secondary | ICD-10-CM | POA: Diagnosis not present

## 2022-05-03 DIAGNOSIS — I10 Essential (primary) hypertension: Secondary | ICD-10-CM | POA: Diagnosis not present

## 2022-05-04 DIAGNOSIS — K219 Gastro-esophageal reflux disease without esophagitis: Secondary | ICD-10-CM | POA: Diagnosis not present

## 2022-05-04 DIAGNOSIS — C787 Secondary malignant neoplasm of liver and intrahepatic bile duct: Secondary | ICD-10-CM | POA: Diagnosis not present

## 2022-05-04 DIAGNOSIS — I10 Essential (primary) hypertension: Secondary | ICD-10-CM | POA: Diagnosis not present

## 2022-05-04 DIAGNOSIS — D500 Iron deficiency anemia secondary to blood loss (chronic): Secondary | ICD-10-CM | POA: Diagnosis not present

## 2022-05-04 DIAGNOSIS — C189 Malignant neoplasm of colon, unspecified: Secondary | ICD-10-CM | POA: Diagnosis not present

## 2022-05-04 DIAGNOSIS — E785 Hyperlipidemia, unspecified: Secondary | ICD-10-CM | POA: Diagnosis not present

## 2022-05-05 DIAGNOSIS — K219 Gastro-esophageal reflux disease without esophagitis: Secondary | ICD-10-CM | POA: Diagnosis not present

## 2022-05-05 DIAGNOSIS — E785 Hyperlipidemia, unspecified: Secondary | ICD-10-CM | POA: Diagnosis not present

## 2022-05-05 DIAGNOSIS — I10 Essential (primary) hypertension: Secondary | ICD-10-CM | POA: Diagnosis not present

## 2022-05-05 DIAGNOSIS — C189 Malignant neoplasm of colon, unspecified: Secondary | ICD-10-CM | POA: Diagnosis not present

## 2022-05-05 DIAGNOSIS — D500 Iron deficiency anemia secondary to blood loss (chronic): Secondary | ICD-10-CM | POA: Diagnosis not present

## 2022-05-05 DIAGNOSIS — C787 Secondary malignant neoplasm of liver and intrahepatic bile duct: Secondary | ICD-10-CM | POA: Diagnosis not present

## 2022-05-06 DIAGNOSIS — I10 Essential (primary) hypertension: Secondary | ICD-10-CM | POA: Diagnosis not present

## 2022-05-06 DIAGNOSIS — C189 Malignant neoplasm of colon, unspecified: Secondary | ICD-10-CM | POA: Diagnosis not present

## 2022-05-06 DIAGNOSIS — E785 Hyperlipidemia, unspecified: Secondary | ICD-10-CM | POA: Diagnosis not present

## 2022-05-06 DIAGNOSIS — D500 Iron deficiency anemia secondary to blood loss (chronic): Secondary | ICD-10-CM | POA: Diagnosis not present

## 2022-05-06 DIAGNOSIS — C787 Secondary malignant neoplasm of liver and intrahepatic bile duct: Secondary | ICD-10-CM | POA: Diagnosis not present

## 2022-05-06 DIAGNOSIS — K219 Gastro-esophageal reflux disease without esophagitis: Secondary | ICD-10-CM | POA: Diagnosis not present

## 2022-05-07 DIAGNOSIS — K219 Gastro-esophageal reflux disease without esophagitis: Secondary | ICD-10-CM | POA: Diagnosis not present

## 2022-05-07 DIAGNOSIS — E785 Hyperlipidemia, unspecified: Secondary | ICD-10-CM | POA: Diagnosis not present

## 2022-05-07 DIAGNOSIS — D500 Iron deficiency anemia secondary to blood loss (chronic): Secondary | ICD-10-CM | POA: Diagnosis not present

## 2022-05-07 DIAGNOSIS — C189 Malignant neoplasm of colon, unspecified: Secondary | ICD-10-CM | POA: Diagnosis not present

## 2022-05-07 DIAGNOSIS — C787 Secondary malignant neoplasm of liver and intrahepatic bile duct: Secondary | ICD-10-CM | POA: Diagnosis not present

## 2022-05-07 DIAGNOSIS — I10 Essential (primary) hypertension: Secondary | ICD-10-CM | POA: Diagnosis not present

## 2022-05-10 DIAGNOSIS — C787 Secondary malignant neoplasm of liver and intrahepatic bile duct: Secondary | ICD-10-CM | POA: Diagnosis not present

## 2022-05-10 DIAGNOSIS — C189 Malignant neoplasm of colon, unspecified: Secondary | ICD-10-CM | POA: Diagnosis not present

## 2022-05-10 DIAGNOSIS — D500 Iron deficiency anemia secondary to blood loss (chronic): Secondary | ICD-10-CM | POA: Diagnosis not present

## 2022-05-10 DIAGNOSIS — I10 Essential (primary) hypertension: Secondary | ICD-10-CM | POA: Diagnosis not present

## 2022-05-10 DIAGNOSIS — E785 Hyperlipidemia, unspecified: Secondary | ICD-10-CM | POA: Diagnosis not present

## 2022-05-10 DIAGNOSIS — K219 Gastro-esophageal reflux disease without esophagitis: Secondary | ICD-10-CM | POA: Diagnosis not present

## 2022-05-12 DIAGNOSIS — K219 Gastro-esophageal reflux disease without esophagitis: Secondary | ICD-10-CM | POA: Diagnosis not present

## 2022-05-12 DIAGNOSIS — E785 Hyperlipidemia, unspecified: Secondary | ICD-10-CM | POA: Diagnosis not present

## 2022-05-12 DIAGNOSIS — C787 Secondary malignant neoplasm of liver and intrahepatic bile duct: Secondary | ICD-10-CM | POA: Diagnosis not present

## 2022-05-12 DIAGNOSIS — C189 Malignant neoplasm of colon, unspecified: Secondary | ICD-10-CM | POA: Diagnosis not present

## 2022-05-12 DIAGNOSIS — I10 Essential (primary) hypertension: Secondary | ICD-10-CM | POA: Diagnosis not present

## 2022-05-12 DIAGNOSIS — D500 Iron deficiency anemia secondary to blood loss (chronic): Secondary | ICD-10-CM | POA: Diagnosis not present

## 2022-05-13 DIAGNOSIS — D500 Iron deficiency anemia secondary to blood loss (chronic): Secondary | ICD-10-CM | POA: Diagnosis not present

## 2022-05-13 DIAGNOSIS — C189 Malignant neoplasm of colon, unspecified: Secondary | ICD-10-CM | POA: Diagnosis not present

## 2022-05-13 DIAGNOSIS — K219 Gastro-esophageal reflux disease without esophagitis: Secondary | ICD-10-CM | POA: Diagnosis not present

## 2022-05-13 DIAGNOSIS — I10 Essential (primary) hypertension: Secondary | ICD-10-CM | POA: Diagnosis not present

## 2022-05-13 DIAGNOSIS — C787 Secondary malignant neoplasm of liver and intrahepatic bile duct: Secondary | ICD-10-CM | POA: Diagnosis not present

## 2022-05-13 DIAGNOSIS — E785 Hyperlipidemia, unspecified: Secondary | ICD-10-CM | POA: Diagnosis not present

## 2022-05-14 DIAGNOSIS — C787 Secondary malignant neoplasm of liver and intrahepatic bile duct: Secondary | ICD-10-CM | POA: Diagnosis not present

## 2022-05-14 DIAGNOSIS — K219 Gastro-esophageal reflux disease without esophagitis: Secondary | ICD-10-CM | POA: Diagnosis not present

## 2022-05-14 DIAGNOSIS — D500 Iron deficiency anemia secondary to blood loss (chronic): Secondary | ICD-10-CM | POA: Diagnosis not present

## 2022-05-14 DIAGNOSIS — C189 Malignant neoplasm of colon, unspecified: Secondary | ICD-10-CM | POA: Diagnosis not present

## 2022-05-14 DIAGNOSIS — I10 Essential (primary) hypertension: Secondary | ICD-10-CM | POA: Diagnosis not present

## 2022-05-14 DIAGNOSIS — E785 Hyperlipidemia, unspecified: Secondary | ICD-10-CM | POA: Diagnosis not present

## 2022-05-17 DIAGNOSIS — I10 Essential (primary) hypertension: Secondary | ICD-10-CM | POA: Diagnosis not present

## 2022-05-17 DIAGNOSIS — K219 Gastro-esophageal reflux disease without esophagitis: Secondary | ICD-10-CM | POA: Diagnosis not present

## 2022-05-17 DIAGNOSIS — D500 Iron deficiency anemia secondary to blood loss (chronic): Secondary | ICD-10-CM | POA: Diagnosis not present

## 2022-05-17 DIAGNOSIS — C787 Secondary malignant neoplasm of liver and intrahepatic bile duct: Secondary | ICD-10-CM | POA: Diagnosis not present

## 2022-05-17 DIAGNOSIS — E785 Hyperlipidemia, unspecified: Secondary | ICD-10-CM | POA: Diagnosis not present

## 2022-05-17 DIAGNOSIS — C189 Malignant neoplasm of colon, unspecified: Secondary | ICD-10-CM | POA: Diagnosis not present

## 2022-05-18 DIAGNOSIS — D500 Iron deficiency anemia secondary to blood loss (chronic): Secondary | ICD-10-CM | POA: Diagnosis not present

## 2022-05-18 DIAGNOSIS — K219 Gastro-esophageal reflux disease without esophagitis: Secondary | ICD-10-CM | POA: Diagnosis not present

## 2022-05-18 DIAGNOSIS — C189 Malignant neoplasm of colon, unspecified: Secondary | ICD-10-CM | POA: Diagnosis not present

## 2022-05-18 DIAGNOSIS — C787 Secondary malignant neoplasm of liver and intrahepatic bile duct: Secondary | ICD-10-CM | POA: Diagnosis not present

## 2022-05-18 DIAGNOSIS — I10 Essential (primary) hypertension: Secondary | ICD-10-CM | POA: Diagnosis not present

## 2022-05-18 DIAGNOSIS — E785 Hyperlipidemia, unspecified: Secondary | ICD-10-CM | POA: Diagnosis not present

## 2022-05-19 DIAGNOSIS — E785 Hyperlipidemia, unspecified: Secondary | ICD-10-CM | POA: Diagnosis not present

## 2022-05-19 DIAGNOSIS — C189 Malignant neoplasm of colon, unspecified: Secondary | ICD-10-CM | POA: Diagnosis not present

## 2022-05-19 DIAGNOSIS — D500 Iron deficiency anemia secondary to blood loss (chronic): Secondary | ICD-10-CM | POA: Diagnosis not present

## 2022-05-19 DIAGNOSIS — C787 Secondary malignant neoplasm of liver and intrahepatic bile duct: Secondary | ICD-10-CM | POA: Diagnosis not present

## 2022-05-19 DIAGNOSIS — I10 Essential (primary) hypertension: Secondary | ICD-10-CM | POA: Diagnosis not present

## 2022-05-19 DIAGNOSIS — K219 Gastro-esophageal reflux disease without esophagitis: Secondary | ICD-10-CM | POA: Diagnosis not present

## 2022-05-20 DIAGNOSIS — K219 Gastro-esophageal reflux disease without esophagitis: Secondary | ICD-10-CM | POA: Diagnosis not present

## 2022-05-20 DIAGNOSIS — C787 Secondary malignant neoplasm of liver and intrahepatic bile duct: Secondary | ICD-10-CM | POA: Diagnosis not present

## 2022-05-20 DIAGNOSIS — D500 Iron deficiency anemia secondary to blood loss (chronic): Secondary | ICD-10-CM | POA: Diagnosis not present

## 2022-05-20 DIAGNOSIS — E785 Hyperlipidemia, unspecified: Secondary | ICD-10-CM | POA: Diagnosis not present

## 2022-05-20 DIAGNOSIS — C189 Malignant neoplasm of colon, unspecified: Secondary | ICD-10-CM | POA: Diagnosis not present

## 2022-05-20 DIAGNOSIS — I10 Essential (primary) hypertension: Secondary | ICD-10-CM | POA: Diagnosis not present

## 2022-05-21 DIAGNOSIS — K219 Gastro-esophageal reflux disease without esophagitis: Secondary | ICD-10-CM | POA: Diagnosis not present

## 2022-05-21 DIAGNOSIS — I10 Essential (primary) hypertension: Secondary | ICD-10-CM | POA: Diagnosis not present

## 2022-05-21 DIAGNOSIS — E785 Hyperlipidemia, unspecified: Secondary | ICD-10-CM | POA: Diagnosis not present

## 2022-05-21 DIAGNOSIS — D500 Iron deficiency anemia secondary to blood loss (chronic): Secondary | ICD-10-CM | POA: Diagnosis not present

## 2022-05-21 DIAGNOSIS — C189 Malignant neoplasm of colon, unspecified: Secondary | ICD-10-CM | POA: Diagnosis not present

## 2022-05-21 DIAGNOSIS — C787 Secondary malignant neoplasm of liver and intrahepatic bile duct: Secondary | ICD-10-CM | POA: Diagnosis not present

## 2022-05-22 DIAGNOSIS — D500 Iron deficiency anemia secondary to blood loss (chronic): Secondary | ICD-10-CM | POA: Diagnosis not present

## 2022-05-22 DIAGNOSIS — E785 Hyperlipidemia, unspecified: Secondary | ICD-10-CM | POA: Diagnosis not present

## 2022-05-22 DIAGNOSIS — J309 Allergic rhinitis, unspecified: Secondary | ICD-10-CM | POA: Diagnosis not present

## 2022-05-22 DIAGNOSIS — F32A Depression, unspecified: Secondary | ICD-10-CM | POA: Diagnosis not present

## 2022-05-22 DIAGNOSIS — Z8616 Personal history of COVID-19: Secondary | ICD-10-CM | POA: Diagnosis not present

## 2022-05-22 DIAGNOSIS — C787 Secondary malignant neoplasm of liver and intrahepatic bile duct: Secondary | ICD-10-CM | POA: Diagnosis not present

## 2022-05-22 DIAGNOSIS — G4700 Insomnia, unspecified: Secondary | ICD-10-CM | POA: Diagnosis not present

## 2022-05-22 DIAGNOSIS — Z9181 History of falling: Secondary | ICD-10-CM | POA: Diagnosis not present

## 2022-05-22 DIAGNOSIS — R159 Full incontinence of feces: Secondary | ICD-10-CM | POA: Diagnosis not present

## 2022-05-22 DIAGNOSIS — Z8709 Personal history of other diseases of the respiratory system: Secondary | ICD-10-CM | POA: Diagnosis not present

## 2022-05-22 DIAGNOSIS — C189 Malignant neoplasm of colon, unspecified: Secondary | ICD-10-CM | POA: Diagnosis not present

## 2022-05-22 DIAGNOSIS — Z741 Need for assistance with personal care: Secondary | ICD-10-CM | POA: Diagnosis not present

## 2022-05-22 DIAGNOSIS — Z86711 Personal history of pulmonary embolism: Secondary | ICD-10-CM | POA: Diagnosis not present

## 2022-05-22 DIAGNOSIS — F419 Anxiety disorder, unspecified: Secondary | ICD-10-CM | POA: Diagnosis not present

## 2022-05-22 DIAGNOSIS — R634 Abnormal weight loss: Secondary | ICD-10-CM | POA: Diagnosis not present

## 2022-05-22 DIAGNOSIS — R197 Diarrhea, unspecified: Secondary | ICD-10-CM | POA: Diagnosis not present

## 2022-05-22 DIAGNOSIS — Z6827 Body mass index (BMI) 27.0-27.9, adult: Secondary | ICD-10-CM | POA: Diagnosis not present

## 2022-05-22 DIAGNOSIS — K5900 Constipation, unspecified: Secondary | ICD-10-CM | POA: Diagnosis not present

## 2022-05-22 DIAGNOSIS — K219 Gastro-esophageal reflux disease without esophagitis: Secondary | ICD-10-CM | POA: Diagnosis not present

## 2022-05-22 DIAGNOSIS — M810 Age-related osteoporosis without current pathological fracture: Secondary | ICD-10-CM | POA: Diagnosis not present

## 2022-05-22 DIAGNOSIS — R32 Unspecified urinary incontinence: Secondary | ICD-10-CM | POA: Diagnosis not present

## 2022-05-22 DIAGNOSIS — M1990 Unspecified osteoarthritis, unspecified site: Secondary | ICD-10-CM | POA: Diagnosis not present

## 2022-05-22 DIAGNOSIS — H4010X0 Unspecified open-angle glaucoma, stage unspecified: Secondary | ICD-10-CM | POA: Diagnosis not present

## 2022-05-22 DIAGNOSIS — Z7401 Bed confinement status: Secondary | ICD-10-CM | POA: Diagnosis not present

## 2022-05-22 DIAGNOSIS — I10 Essential (primary) hypertension: Secondary | ICD-10-CM | POA: Diagnosis not present

## 2022-05-24 DIAGNOSIS — E785 Hyperlipidemia, unspecified: Secondary | ICD-10-CM | POA: Diagnosis not present

## 2022-05-24 DIAGNOSIS — I10 Essential (primary) hypertension: Secondary | ICD-10-CM | POA: Diagnosis not present

## 2022-05-24 DIAGNOSIS — C787 Secondary malignant neoplasm of liver and intrahepatic bile duct: Secondary | ICD-10-CM | POA: Diagnosis not present

## 2022-05-24 DIAGNOSIS — C189 Malignant neoplasm of colon, unspecified: Secondary | ICD-10-CM | POA: Diagnosis not present

## 2022-05-24 DIAGNOSIS — D500 Iron deficiency anemia secondary to blood loss (chronic): Secondary | ICD-10-CM | POA: Diagnosis not present

## 2022-05-24 DIAGNOSIS — K219 Gastro-esophageal reflux disease without esophagitis: Secondary | ICD-10-CM | POA: Diagnosis not present

## 2022-05-26 DIAGNOSIS — E785 Hyperlipidemia, unspecified: Secondary | ICD-10-CM | POA: Diagnosis not present

## 2022-05-26 DIAGNOSIS — C787 Secondary malignant neoplasm of liver and intrahepatic bile duct: Secondary | ICD-10-CM | POA: Diagnosis not present

## 2022-05-26 DIAGNOSIS — D500 Iron deficiency anemia secondary to blood loss (chronic): Secondary | ICD-10-CM | POA: Diagnosis not present

## 2022-05-26 DIAGNOSIS — I872 Venous insufficiency (chronic) (peripheral): Secondary | ICD-10-CM | POA: Diagnosis not present

## 2022-05-26 DIAGNOSIS — C189 Malignant neoplasm of colon, unspecified: Secondary | ICD-10-CM | POA: Diagnosis not present

## 2022-05-26 DIAGNOSIS — M179 Osteoarthritis of knee, unspecified: Secondary | ICD-10-CM | POA: Diagnosis not present

## 2022-05-26 DIAGNOSIS — I10 Essential (primary) hypertension: Secondary | ICD-10-CM | POA: Diagnosis not present

## 2022-05-26 DIAGNOSIS — J302 Other seasonal allergic rhinitis: Secondary | ICD-10-CM | POA: Diagnosis not present

## 2022-05-26 DIAGNOSIS — M8580 Other specified disorders of bone density and structure, unspecified site: Secondary | ICD-10-CM | POA: Diagnosis not present

## 2022-05-26 DIAGNOSIS — D860 Sarcoidosis of lung: Secondary | ICD-10-CM | POA: Diagnosis not present

## 2022-05-26 DIAGNOSIS — I2609 Other pulmonary embolism with acute cor pulmonale: Secondary | ICD-10-CM | POA: Diagnosis not present

## 2022-05-26 DIAGNOSIS — D649 Anemia, unspecified: Secondary | ICD-10-CM | POA: Diagnosis not present

## 2022-05-26 DIAGNOSIS — F329 Major depressive disorder, single episode, unspecified: Secondary | ICD-10-CM | POA: Diagnosis not present

## 2022-05-26 DIAGNOSIS — K219 Gastro-esophageal reflux disease without esophagitis: Secondary | ICD-10-CM | POA: Diagnosis not present

## 2022-05-26 DIAGNOSIS — R269 Unspecified abnormalities of gait and mobility: Secondary | ICD-10-CM | POA: Diagnosis not present

## 2022-05-28 DIAGNOSIS — E785 Hyperlipidemia, unspecified: Secondary | ICD-10-CM | POA: Diagnosis not present

## 2022-05-28 DIAGNOSIS — D500 Iron deficiency anemia secondary to blood loss (chronic): Secondary | ICD-10-CM | POA: Diagnosis not present

## 2022-05-28 DIAGNOSIS — C787 Secondary malignant neoplasm of liver and intrahepatic bile duct: Secondary | ICD-10-CM | POA: Diagnosis not present

## 2022-05-28 DIAGNOSIS — I10 Essential (primary) hypertension: Secondary | ICD-10-CM | POA: Diagnosis not present

## 2022-05-28 DIAGNOSIS — C189 Malignant neoplasm of colon, unspecified: Secondary | ICD-10-CM | POA: Diagnosis not present

## 2022-05-28 DIAGNOSIS — K219 Gastro-esophageal reflux disease without esophagitis: Secondary | ICD-10-CM | POA: Diagnosis not present

## 2022-05-30 DIAGNOSIS — E785 Hyperlipidemia, unspecified: Secondary | ICD-10-CM | POA: Diagnosis not present

## 2022-05-30 DIAGNOSIS — I10 Essential (primary) hypertension: Secondary | ICD-10-CM | POA: Diagnosis not present

## 2022-05-30 DIAGNOSIS — D500 Iron deficiency anemia secondary to blood loss (chronic): Secondary | ICD-10-CM | POA: Diagnosis not present

## 2022-05-30 DIAGNOSIS — C787 Secondary malignant neoplasm of liver and intrahepatic bile duct: Secondary | ICD-10-CM | POA: Diagnosis not present

## 2022-05-30 DIAGNOSIS — C189 Malignant neoplasm of colon, unspecified: Secondary | ICD-10-CM | POA: Diagnosis not present

## 2022-05-30 DIAGNOSIS — K219 Gastro-esophageal reflux disease without esophagitis: Secondary | ICD-10-CM | POA: Diagnosis not present

## 2022-05-31 DIAGNOSIS — C787 Secondary malignant neoplasm of liver and intrahepatic bile duct: Secondary | ICD-10-CM | POA: Diagnosis not present

## 2022-05-31 DIAGNOSIS — I10 Essential (primary) hypertension: Secondary | ICD-10-CM | POA: Diagnosis not present

## 2022-05-31 DIAGNOSIS — C189 Malignant neoplasm of colon, unspecified: Secondary | ICD-10-CM | POA: Diagnosis not present

## 2022-05-31 DIAGNOSIS — D500 Iron deficiency anemia secondary to blood loss (chronic): Secondary | ICD-10-CM | POA: Diagnosis not present

## 2022-05-31 DIAGNOSIS — E785 Hyperlipidemia, unspecified: Secondary | ICD-10-CM | POA: Diagnosis not present

## 2022-05-31 DIAGNOSIS — K219 Gastro-esophageal reflux disease without esophagitis: Secondary | ICD-10-CM | POA: Diagnosis not present

## 2022-06-01 DIAGNOSIS — D500 Iron deficiency anemia secondary to blood loss (chronic): Secondary | ICD-10-CM | POA: Diagnosis not present

## 2022-06-01 DIAGNOSIS — C189 Malignant neoplasm of colon, unspecified: Secondary | ICD-10-CM | POA: Diagnosis not present

## 2022-06-01 DIAGNOSIS — C787 Secondary malignant neoplasm of liver and intrahepatic bile duct: Secondary | ICD-10-CM | POA: Diagnosis not present

## 2022-06-01 DIAGNOSIS — I10 Essential (primary) hypertension: Secondary | ICD-10-CM | POA: Diagnosis not present

## 2022-06-01 DIAGNOSIS — E785 Hyperlipidemia, unspecified: Secondary | ICD-10-CM | POA: Diagnosis not present

## 2022-06-01 DIAGNOSIS — K219 Gastro-esophageal reflux disease without esophagitis: Secondary | ICD-10-CM | POA: Diagnosis not present

## 2022-06-02 DIAGNOSIS — I10 Essential (primary) hypertension: Secondary | ICD-10-CM | POA: Diagnosis not present

## 2022-06-02 DIAGNOSIS — K219 Gastro-esophageal reflux disease without esophagitis: Secondary | ICD-10-CM | POA: Diagnosis not present

## 2022-06-02 DIAGNOSIS — E785 Hyperlipidemia, unspecified: Secondary | ICD-10-CM | POA: Diagnosis not present

## 2022-06-02 DIAGNOSIS — D500 Iron deficiency anemia secondary to blood loss (chronic): Secondary | ICD-10-CM | POA: Diagnosis not present

## 2022-06-02 DIAGNOSIS — C787 Secondary malignant neoplasm of liver and intrahepatic bile duct: Secondary | ICD-10-CM | POA: Diagnosis not present

## 2022-06-02 DIAGNOSIS — C189 Malignant neoplasm of colon, unspecified: Secondary | ICD-10-CM | POA: Diagnosis not present

## 2022-06-04 DIAGNOSIS — C189 Malignant neoplasm of colon, unspecified: Secondary | ICD-10-CM | POA: Diagnosis not present

## 2022-06-04 DIAGNOSIS — D500 Iron deficiency anemia secondary to blood loss (chronic): Secondary | ICD-10-CM | POA: Diagnosis not present

## 2022-06-04 DIAGNOSIS — C787 Secondary malignant neoplasm of liver and intrahepatic bile duct: Secondary | ICD-10-CM | POA: Diagnosis not present

## 2022-06-04 DIAGNOSIS — E785 Hyperlipidemia, unspecified: Secondary | ICD-10-CM | POA: Diagnosis not present

## 2022-06-04 DIAGNOSIS — I10 Essential (primary) hypertension: Secondary | ICD-10-CM | POA: Diagnosis not present

## 2022-06-04 DIAGNOSIS — K219 Gastro-esophageal reflux disease without esophagitis: Secondary | ICD-10-CM | POA: Diagnosis not present

## 2022-06-07 DIAGNOSIS — E785 Hyperlipidemia, unspecified: Secondary | ICD-10-CM | POA: Diagnosis not present

## 2022-06-07 DIAGNOSIS — C189 Malignant neoplasm of colon, unspecified: Secondary | ICD-10-CM | POA: Diagnosis not present

## 2022-06-07 DIAGNOSIS — I10 Essential (primary) hypertension: Secondary | ICD-10-CM | POA: Diagnosis not present

## 2022-06-07 DIAGNOSIS — D500 Iron deficiency anemia secondary to blood loss (chronic): Secondary | ICD-10-CM | POA: Diagnosis not present

## 2022-06-07 DIAGNOSIS — K219 Gastro-esophageal reflux disease without esophagitis: Secondary | ICD-10-CM | POA: Diagnosis not present

## 2022-06-07 DIAGNOSIS — C787 Secondary malignant neoplasm of liver and intrahepatic bile duct: Secondary | ICD-10-CM | POA: Diagnosis not present

## 2022-06-09 DIAGNOSIS — I10 Essential (primary) hypertension: Secondary | ICD-10-CM | POA: Diagnosis not present

## 2022-06-09 DIAGNOSIS — C787 Secondary malignant neoplasm of liver and intrahepatic bile duct: Secondary | ICD-10-CM | POA: Diagnosis not present

## 2022-06-09 DIAGNOSIS — C189 Malignant neoplasm of colon, unspecified: Secondary | ICD-10-CM | POA: Diagnosis not present

## 2022-06-09 DIAGNOSIS — E785 Hyperlipidemia, unspecified: Secondary | ICD-10-CM | POA: Diagnosis not present

## 2022-06-09 DIAGNOSIS — K219 Gastro-esophageal reflux disease without esophagitis: Secondary | ICD-10-CM | POA: Diagnosis not present

## 2022-06-09 DIAGNOSIS — D500 Iron deficiency anemia secondary to blood loss (chronic): Secondary | ICD-10-CM | POA: Diagnosis not present

## 2022-06-10 DIAGNOSIS — D500 Iron deficiency anemia secondary to blood loss (chronic): Secondary | ICD-10-CM | POA: Diagnosis not present

## 2022-06-10 DIAGNOSIS — C787 Secondary malignant neoplasm of liver and intrahepatic bile duct: Secondary | ICD-10-CM | POA: Diagnosis not present

## 2022-06-10 DIAGNOSIS — K219 Gastro-esophageal reflux disease without esophagitis: Secondary | ICD-10-CM | POA: Diagnosis not present

## 2022-06-10 DIAGNOSIS — C189 Malignant neoplasm of colon, unspecified: Secondary | ICD-10-CM | POA: Diagnosis not present

## 2022-06-10 DIAGNOSIS — I10 Essential (primary) hypertension: Secondary | ICD-10-CM | POA: Diagnosis not present

## 2022-06-10 DIAGNOSIS — E785 Hyperlipidemia, unspecified: Secondary | ICD-10-CM | POA: Diagnosis not present

## 2022-06-11 DIAGNOSIS — C189 Malignant neoplasm of colon, unspecified: Secondary | ICD-10-CM | POA: Diagnosis not present

## 2022-06-11 DIAGNOSIS — R4182 Altered mental status, unspecified: Secondary | ICD-10-CM | POA: Diagnosis not present

## 2022-06-11 DIAGNOSIS — R5381 Other malaise: Secondary | ICD-10-CM | POA: Diagnosis not present

## 2022-06-11 DIAGNOSIS — I10 Essential (primary) hypertension: Secondary | ICD-10-CM | POA: Diagnosis not present

## 2022-06-11 DIAGNOSIS — K219 Gastro-esophageal reflux disease without esophagitis: Secondary | ICD-10-CM | POA: Diagnosis not present

## 2022-06-11 DIAGNOSIS — C787 Secondary malignant neoplasm of liver and intrahepatic bile duct: Secondary | ICD-10-CM | POA: Diagnosis not present

## 2022-06-11 DIAGNOSIS — E785 Hyperlipidemia, unspecified: Secondary | ICD-10-CM | POA: Diagnosis not present

## 2022-06-11 DIAGNOSIS — Z743 Need for continuous supervision: Secondary | ICD-10-CM | POA: Diagnosis not present

## 2022-06-11 DIAGNOSIS — D500 Iron deficiency anemia secondary to blood loss (chronic): Secondary | ICD-10-CM | POA: Diagnosis not present

## 2022-06-12 DIAGNOSIS — K219 Gastro-esophageal reflux disease without esophagitis: Secondary | ICD-10-CM | POA: Diagnosis not present

## 2022-06-12 DIAGNOSIS — C189 Malignant neoplasm of colon, unspecified: Secondary | ICD-10-CM | POA: Diagnosis not present

## 2022-06-12 DIAGNOSIS — I10 Essential (primary) hypertension: Secondary | ICD-10-CM | POA: Diagnosis not present

## 2022-06-12 DIAGNOSIS — D500 Iron deficiency anemia secondary to blood loss (chronic): Secondary | ICD-10-CM | POA: Diagnosis not present

## 2022-06-12 DIAGNOSIS — E785 Hyperlipidemia, unspecified: Secondary | ICD-10-CM | POA: Diagnosis not present

## 2022-06-12 DIAGNOSIS — C787 Secondary malignant neoplasm of liver and intrahepatic bile duct: Secondary | ICD-10-CM | POA: Diagnosis not present

## 2022-06-13 DIAGNOSIS — D500 Iron deficiency anemia secondary to blood loss (chronic): Secondary | ICD-10-CM | POA: Diagnosis not present

## 2022-06-13 DIAGNOSIS — E785 Hyperlipidemia, unspecified: Secondary | ICD-10-CM | POA: Diagnosis not present

## 2022-06-13 DIAGNOSIS — C787 Secondary malignant neoplasm of liver and intrahepatic bile duct: Secondary | ICD-10-CM | POA: Diagnosis not present

## 2022-06-13 DIAGNOSIS — I10 Essential (primary) hypertension: Secondary | ICD-10-CM | POA: Diagnosis not present

## 2022-06-13 DIAGNOSIS — K219 Gastro-esophageal reflux disease without esophagitis: Secondary | ICD-10-CM | POA: Diagnosis not present

## 2022-06-13 DIAGNOSIS — C189 Malignant neoplasm of colon, unspecified: Secondary | ICD-10-CM | POA: Diagnosis not present

## 2022-06-14 DIAGNOSIS — K219 Gastro-esophageal reflux disease without esophagitis: Secondary | ICD-10-CM | POA: Diagnosis not present

## 2022-06-14 DIAGNOSIS — C787 Secondary malignant neoplasm of liver and intrahepatic bile duct: Secondary | ICD-10-CM | POA: Diagnosis not present

## 2022-06-14 DIAGNOSIS — C189 Malignant neoplasm of colon, unspecified: Secondary | ICD-10-CM | POA: Diagnosis not present

## 2022-06-14 DIAGNOSIS — I10 Essential (primary) hypertension: Secondary | ICD-10-CM | POA: Diagnosis not present

## 2022-06-14 DIAGNOSIS — E785 Hyperlipidemia, unspecified: Secondary | ICD-10-CM | POA: Diagnosis not present

## 2022-06-14 DIAGNOSIS — D500 Iron deficiency anemia secondary to blood loss (chronic): Secondary | ICD-10-CM | POA: Diagnosis not present

## 2022-06-15 DIAGNOSIS — D500 Iron deficiency anemia secondary to blood loss (chronic): Secondary | ICD-10-CM | POA: Diagnosis not present

## 2022-06-15 DIAGNOSIS — E785 Hyperlipidemia, unspecified: Secondary | ICD-10-CM | POA: Diagnosis not present

## 2022-06-15 DIAGNOSIS — K219 Gastro-esophageal reflux disease without esophagitis: Secondary | ICD-10-CM | POA: Diagnosis not present

## 2022-06-15 DIAGNOSIS — C787 Secondary malignant neoplasm of liver and intrahepatic bile duct: Secondary | ICD-10-CM | POA: Diagnosis not present

## 2022-06-15 DIAGNOSIS — I10 Essential (primary) hypertension: Secondary | ICD-10-CM | POA: Diagnosis not present

## 2022-06-15 DIAGNOSIS — C189 Malignant neoplasm of colon, unspecified: Secondary | ICD-10-CM | POA: Diagnosis not present

## 2022-06-22 DEATH — deceased

## 2022-08-28 IMAGING — US US BIOPSY CORE LIVER
1 series · 15 of 20 positions shown · non-contrast
Comparison: none

INDICATION: Colon cancer.  New liver lesions.

[Series 1: us biopsy core liver · 20 acquisitions, 15 frames shown]
[im 1/20]
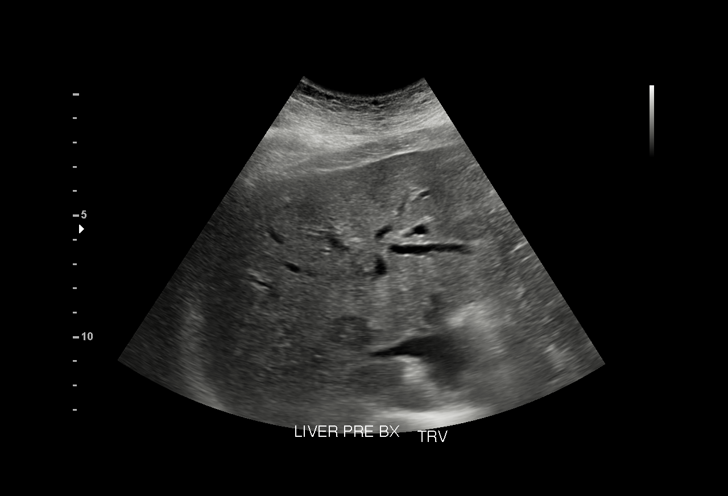
[im 3/20]
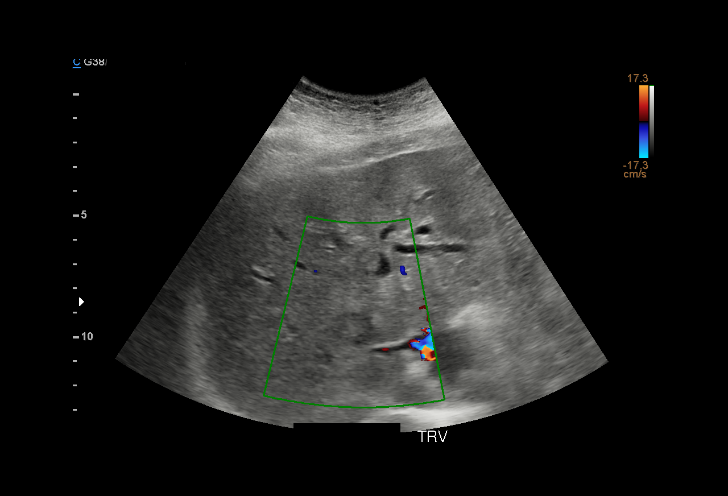
[im 4/20]
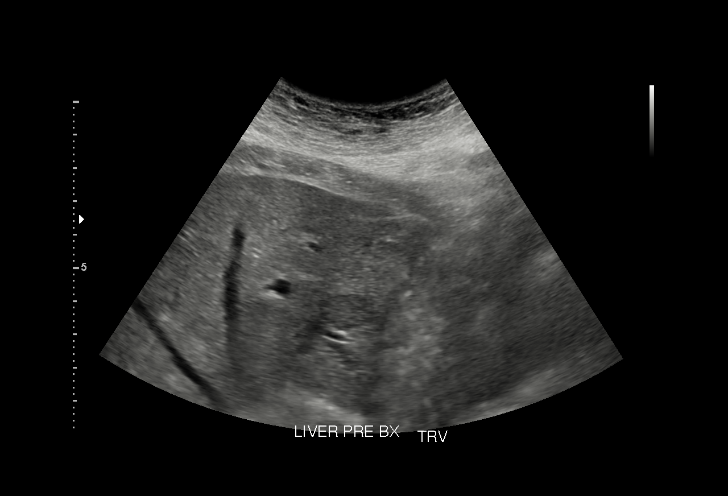
[im 5/20]
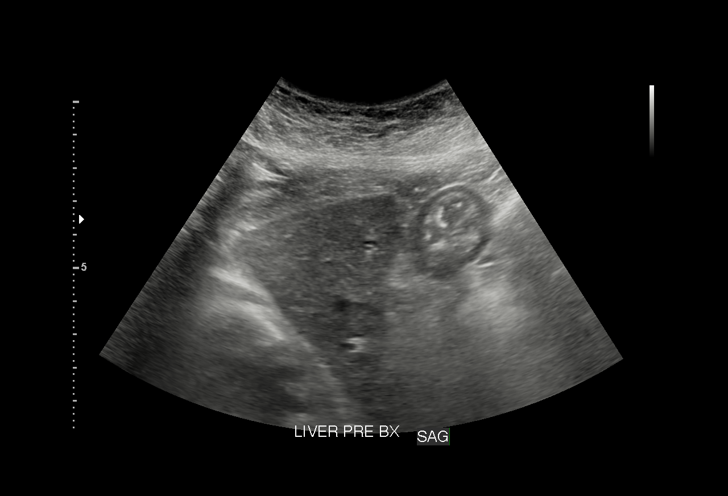
[im 7/20]
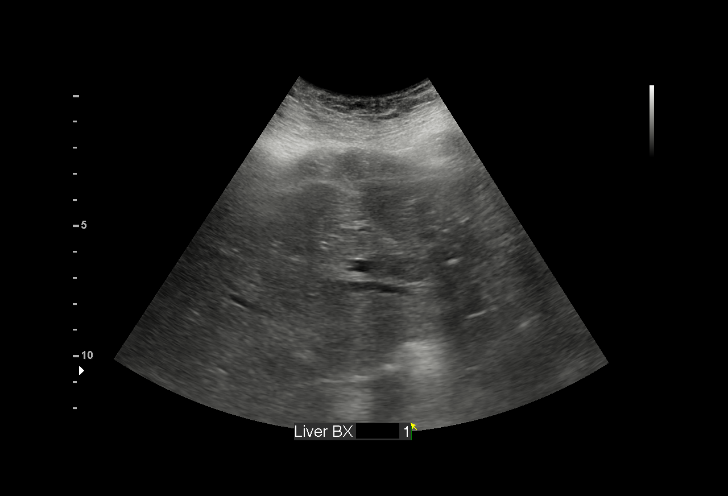
[im 8/20]
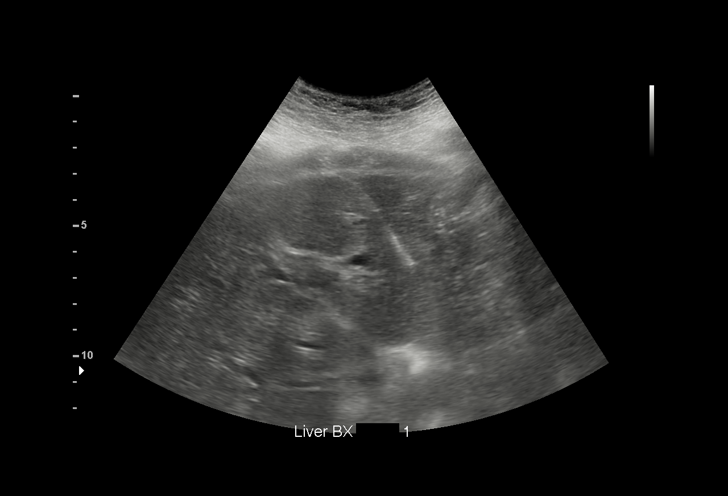
[im 9/20]
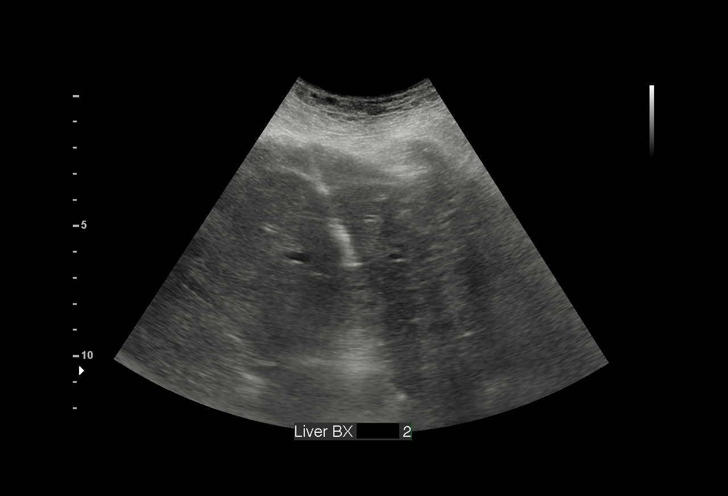
[im 11/20]
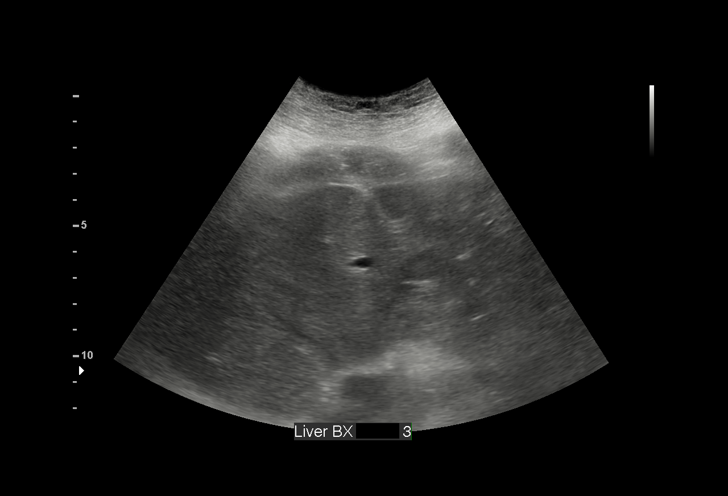
[im 12/20]
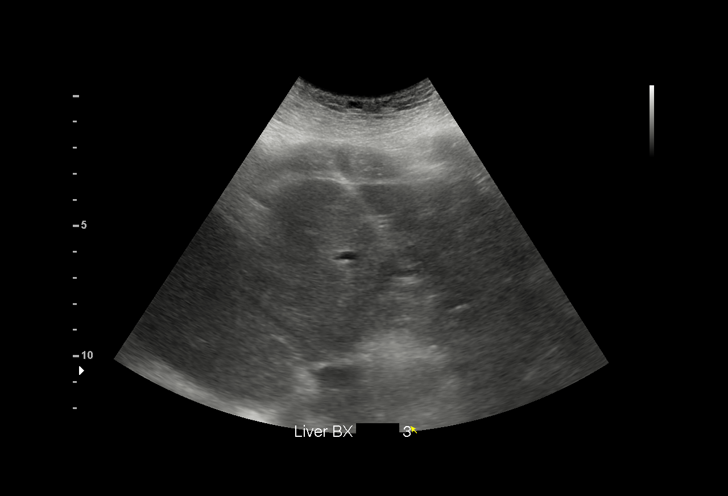
[im 13/20]
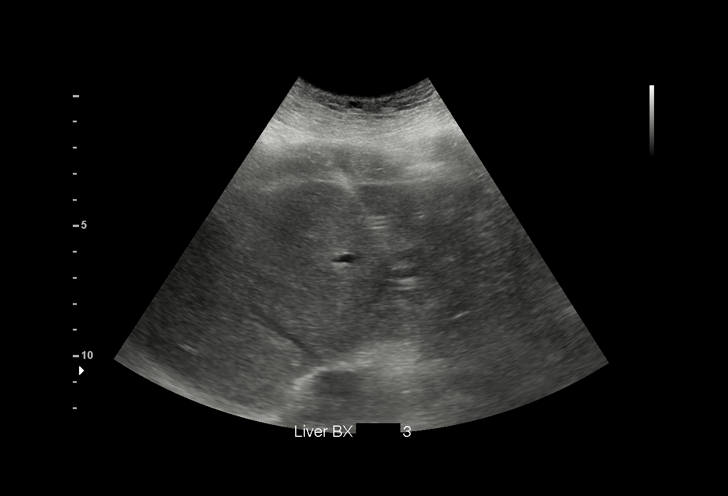
[im 15/20]
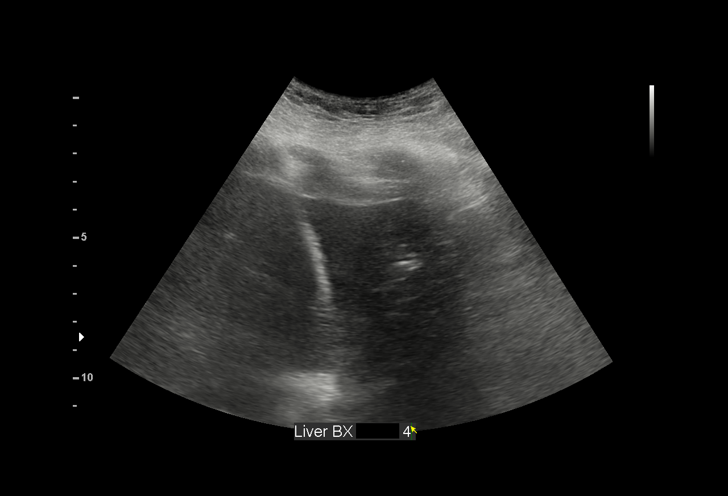
[im 16/20]
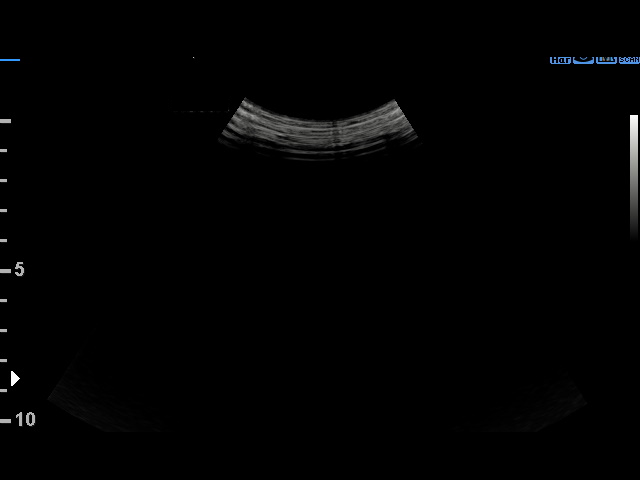
[im 17/20]
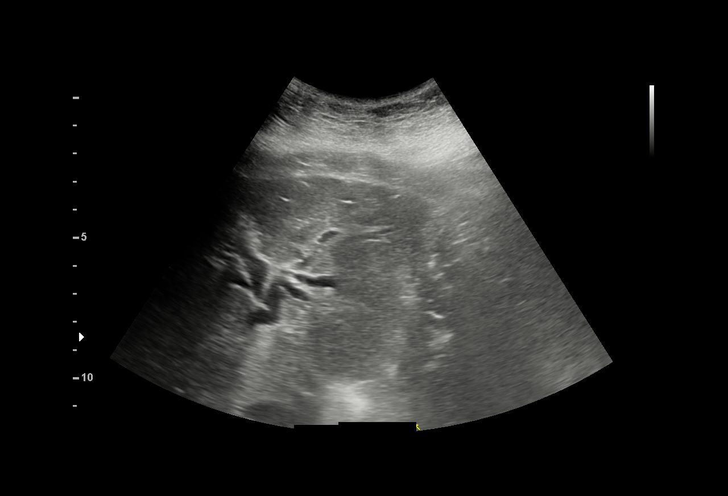
[im 19/20]
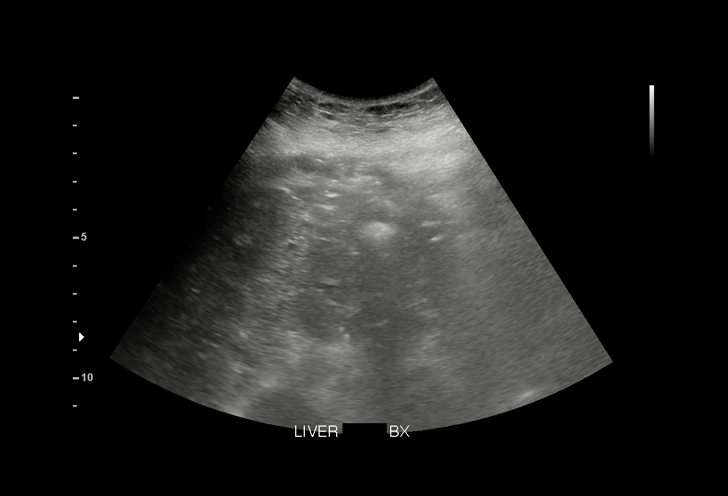
[im 20/20]
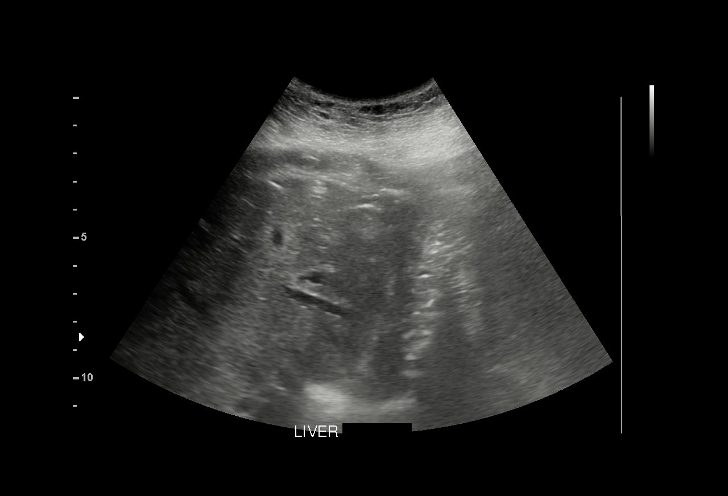

[15 of 20 positions shown; findings below may reference images not displayed]

EXAM:
CT and Ultrasound-guided liver mass biopsy

MEDICATIONS:
None.

ANESTHESIA/SEDATION:
Moderate (conscious) sedation was employed during this procedure. A
total of Versed 2 mg and Fentanyl 150 mcg was administered
intravenously.

Moderate Sedation Time: 30 minutes. The patient's level of
consciousness and vital signs were monitored continuously by
radiology nursing throughout the procedure under my direct
supervision.

COMPLICATIONS:
None immediate.

PROCEDURE:
Informed written consent was obtained from the patient after a
thorough discussion of the procedural risks, benefits and
alternatives. All questions were addressed. Maximal Sterile Barrier
Technique was utilized including caps, mask, sterile gowns, sterile
gloves, sterile drape, hand hygiene and skin antiseptic. A timeout
was performed prior to the initiation of the procedure.

Noncontrast CT of the abdomen was performed to confirm location
hepatic lesions. Biopsy was predominately performed with ultrasound
guidance.

Two hypoechoic lesions could be identified on ultrasound. The left
hepatic lobe lesion appeared more amenable to percutaneous biopsy.

Sterile ultrasound probe cover and gel utilized throughout the
procedure. Following local lidocaine administration, 17 gauge
introducer needle advanced into the hypoechoic left hepatic mass.
Four 18 gauge cores were obtained and sent to pathology in formalin.

Gel-Foam slurry administered through the introducer needle and 5
minutes of manual compression applied for hemostasis.
IMPRESSION: CT and ultrasound guided biopsy of left liver mass as above.
